# Patient Record
Sex: Female | Born: 1975 | Race: Black or African American | Hispanic: No | Smoking: Never smoker
Health system: Southern US, Community
[De-identification: ages and names within clinical notes are randomized; demographics above are authoritative.]

## PROBLEM LIST (undated history)

## (undated) ENCOUNTER — Emergency Department (HOSPITAL_COMMUNITY): Payer: Self-pay

## (undated) DIAGNOSIS — J3089 Other allergic rhinitis: Secondary | ICD-10-CM

## (undated) DIAGNOSIS — N201 Calculus of ureter: Secondary | ICD-10-CM

## (undated) DIAGNOSIS — F909 Attention-deficit hyperactivity disorder, unspecified type: Secondary | ICD-10-CM

## (undated) DIAGNOSIS — E210 Primary hyperparathyroidism: Secondary | ICD-10-CM

## (undated) DIAGNOSIS — Z87442 Personal history of urinary calculi: Secondary | ICD-10-CM

## (undated) DIAGNOSIS — Z923 Personal history of irradiation: Secondary | ICD-10-CM

## (undated) DIAGNOSIS — R519 Headache, unspecified: Secondary | ICD-10-CM

## (undated) DIAGNOSIS — M654 Radial styloid tenosynovitis [de Quervain]: Secondary | ICD-10-CM

## (undated) DIAGNOSIS — Z8719 Personal history of other diseases of the digestive system: Secondary | ICD-10-CM

## (undated) DIAGNOSIS — Z972 Presence of dental prosthetic device (complete) (partial): Secondary | ICD-10-CM

## (undated) DIAGNOSIS — Z973 Presence of spectacles and contact lenses: Secondary | ICD-10-CM

## (undated) DIAGNOSIS — K219 Gastro-esophageal reflux disease without esophagitis: Secondary | ICD-10-CM

## (undated) DIAGNOSIS — R3915 Urgency of urination: Secondary | ICD-10-CM

## (undated) DIAGNOSIS — D649 Anemia, unspecified: Secondary | ICD-10-CM

## (undated) DIAGNOSIS — R351 Nocturia: Secondary | ICD-10-CM

## (undated) DIAGNOSIS — M67432 Ganglion, left wrist: Secondary | ICD-10-CM

## (undated) DIAGNOSIS — K5900 Constipation, unspecified: Secondary | ICD-10-CM

## (undated) DIAGNOSIS — F411 Generalized anxiety disorder: Secondary | ICD-10-CM

## (undated) DIAGNOSIS — R350 Frequency of micturition: Secondary | ICD-10-CM

## (undated) DIAGNOSIS — Z6830 Body mass index (BMI) 30.0-30.9, adult: Secondary | ICD-10-CM

## (undated) DIAGNOSIS — R300 Dysuria: Secondary | ICD-10-CM

## (undated) DIAGNOSIS — M1990 Unspecified osteoarthritis, unspecified site: Secondary | ICD-10-CM

## (undated) DIAGNOSIS — A419 Sepsis, unspecified organism: Secondary | ICD-10-CM

## (undated) DIAGNOSIS — E079 Disorder of thyroid, unspecified: Secondary | ICD-10-CM

## (undated) DIAGNOSIS — J45909 Unspecified asthma, uncomplicated: Secondary | ICD-10-CM

## (undated) DIAGNOSIS — R319 Hematuria, unspecified: Secondary | ICD-10-CM

## (undated) HISTORY — DX: Anemia, unspecified: D64.9

## (undated) HISTORY — DX: Disorder of thyroid, unspecified: E07.9

## (undated) HISTORY — DX: Gastro-esophageal reflux disease without esophagitis: K21.9

## (undated) HISTORY — DX: Body mass index (BMI) 30.0-30.9, adult: Z68.30

---

## 1997-09-15 ENCOUNTER — Other Ambulatory Visit: Admission: RE | Admit: 1997-09-15 | Discharge: 1997-09-15 | Payer: Self-pay | Admitting: Obstetrics and Gynecology

## 1997-09-29 ENCOUNTER — Observation Stay (HOSPITAL_COMMUNITY): Admission: AD | Admit: 1997-09-29 | Discharge: 1997-09-29 | Payer: Self-pay | Admitting: Obstetrics and Gynecology

## 1997-10-03 ENCOUNTER — Inpatient Hospital Stay (HOSPITAL_COMMUNITY): Admission: AD | Admit: 1997-10-03 | Discharge: 1997-10-03 | Payer: Self-pay | Admitting: Obstetrics and Gynecology

## 1998-01-02 ENCOUNTER — Inpatient Hospital Stay (HOSPITAL_COMMUNITY): Admission: AD | Admit: 1998-01-02 | Discharge: 1998-01-02 | Payer: Self-pay | Admitting: *Deleted

## 1998-01-07 ENCOUNTER — Inpatient Hospital Stay (HOSPITAL_COMMUNITY): Admission: AD | Admit: 1998-01-07 | Discharge: 1998-01-07 | Payer: Self-pay | Admitting: Obstetrics and Gynecology

## 1998-01-08 ENCOUNTER — Inpatient Hospital Stay (HOSPITAL_COMMUNITY): Admission: AD | Admit: 1998-01-08 | Discharge: 1998-01-08 | Payer: Self-pay | Admitting: Obstetrics and Gynecology

## 1998-01-20 ENCOUNTER — Ambulatory Visit (HOSPITAL_COMMUNITY): Admission: RE | Admit: 1998-01-20 | Discharge: 1998-01-20 | Payer: Self-pay | Admitting: Obstetrics and Gynecology

## 1998-02-22 ENCOUNTER — Inpatient Hospital Stay (HOSPITAL_COMMUNITY): Admission: AD | Admit: 1998-02-22 | Discharge: 1998-02-22 | Payer: Self-pay | Admitting: Obstetrics and Gynecology

## 1998-03-05 ENCOUNTER — Inpatient Hospital Stay (HOSPITAL_COMMUNITY): Admission: AD | Admit: 1998-03-05 | Discharge: 1998-03-05 | Payer: Self-pay | Admitting: Obstetrics and Gynecology

## 1998-03-17 ENCOUNTER — Inpatient Hospital Stay (HOSPITAL_COMMUNITY): Admission: AD | Admit: 1998-03-17 | Discharge: 1998-03-20 | Payer: Self-pay | Admitting: Obstetrics and Gynecology

## 1998-09-20 ENCOUNTER — Other Ambulatory Visit: Admission: RE | Admit: 1998-09-20 | Discharge: 1998-09-20 | Payer: Self-pay | Admitting: Obstetrics and Gynecology

## 1998-12-26 ENCOUNTER — Emergency Department (HOSPITAL_COMMUNITY): Admission: EM | Admit: 1998-12-26 | Discharge: 1998-12-26 | Payer: Self-pay | Admitting: Emergency Medicine

## 1999-06-20 ENCOUNTER — Inpatient Hospital Stay (HOSPITAL_COMMUNITY): Admission: AD | Admit: 1999-06-20 | Discharge: 1999-06-20 | Payer: Self-pay | Admitting: Obstetrics & Gynecology

## 1999-07-27 ENCOUNTER — Inpatient Hospital Stay (HOSPITAL_COMMUNITY): Admission: AD | Admit: 1999-07-27 | Discharge: 1999-07-27 | Payer: Self-pay | Admitting: Obstetrics

## 1999-10-06 ENCOUNTER — Inpatient Hospital Stay (HOSPITAL_COMMUNITY): Admission: AD | Admit: 1999-10-06 | Discharge: 1999-10-06 | Payer: Self-pay | Admitting: Obstetrics & Gynecology

## 2000-02-24 ENCOUNTER — Inpatient Hospital Stay (HOSPITAL_COMMUNITY): Admission: AD | Admit: 2000-02-24 | Discharge: 2000-02-24 | Payer: Self-pay | Admitting: Obstetrics & Gynecology

## 2000-09-01 ENCOUNTER — Emergency Department (HOSPITAL_COMMUNITY): Admission: EM | Admit: 2000-09-01 | Discharge: 2000-09-01 | Payer: Self-pay | Admitting: Emergency Medicine

## 2000-12-18 ENCOUNTER — Other Ambulatory Visit: Admission: RE | Admit: 2000-12-18 | Discharge: 2000-12-18 | Payer: Self-pay | Admitting: Internal Medicine

## 2000-12-21 ENCOUNTER — Encounter: Payer: Self-pay | Admitting: Internal Medicine

## 2000-12-21 ENCOUNTER — Encounter: Admission: RE | Admit: 2000-12-21 | Discharge: 2000-12-21 | Payer: Self-pay | Admitting: Internal Medicine

## 2001-01-14 ENCOUNTER — Encounter: Payer: Self-pay | Admitting: Internal Medicine

## 2001-01-14 ENCOUNTER — Encounter: Admission: RE | Admit: 2001-01-14 | Discharge: 2001-01-14 | Payer: Self-pay | Admitting: Internal Medicine

## 2001-11-03 ENCOUNTER — Inpatient Hospital Stay (HOSPITAL_COMMUNITY): Admission: AD | Admit: 2001-11-03 | Discharge: 2001-11-03 | Payer: Self-pay | Admitting: Obstetrics and Gynecology

## 2001-11-03 ENCOUNTER — Encounter: Payer: Self-pay | Admitting: Obstetrics and Gynecology

## 2001-11-12 ENCOUNTER — Observation Stay (HOSPITAL_COMMUNITY): Admission: AD | Admit: 2001-11-12 | Discharge: 2001-11-12 | Payer: Self-pay | Admitting: Obstetrics and Gynecology

## 2001-11-21 ENCOUNTER — Inpatient Hospital Stay (HOSPITAL_COMMUNITY): Admission: AD | Admit: 2001-11-21 | Discharge: 2001-11-21 | Payer: Self-pay | Admitting: Obstetrics and Gynecology

## 2001-11-25 ENCOUNTER — Other Ambulatory Visit: Admission: RE | Admit: 2001-11-25 | Discharge: 2001-11-25 | Payer: Self-pay | Admitting: Obstetrics and Gynecology

## 2001-12-06 ENCOUNTER — Inpatient Hospital Stay (HOSPITAL_COMMUNITY): Admission: AD | Admit: 2001-12-06 | Discharge: 2001-12-06 | Payer: Self-pay | Admitting: Obstetrics and Gynecology

## 2002-01-16 ENCOUNTER — Ambulatory Visit (HOSPITAL_COMMUNITY): Admission: RE | Admit: 2002-01-16 | Discharge: 2002-01-16 | Payer: Self-pay | Admitting: Obstetrics and Gynecology

## 2002-01-16 ENCOUNTER — Encounter: Payer: Self-pay | Admitting: Obstetrics and Gynecology

## 2002-05-01 ENCOUNTER — Inpatient Hospital Stay (HOSPITAL_COMMUNITY): Admission: AD | Admit: 2002-05-01 | Discharge: 2002-05-01 | Payer: Self-pay | Admitting: Obstetrics and Gynecology

## 2002-06-02 ENCOUNTER — Inpatient Hospital Stay (HOSPITAL_COMMUNITY): Admission: AD | Admit: 2002-06-02 | Discharge: 2002-06-04 | Payer: Self-pay | Admitting: Obstetrics and Gynecology

## 2003-08-31 ENCOUNTER — Emergency Department (HOSPITAL_COMMUNITY): Admission: EM | Admit: 2003-08-31 | Discharge: 2003-09-01 | Payer: Self-pay | Admitting: Emergency Medicine

## 2003-11-18 ENCOUNTER — Inpatient Hospital Stay (HOSPITAL_COMMUNITY): Admission: AD | Admit: 2003-11-18 | Discharge: 2003-11-19 | Payer: Self-pay | Admitting: Obstetrics and Gynecology

## 2003-12-04 ENCOUNTER — Inpatient Hospital Stay (HOSPITAL_COMMUNITY): Admission: AD | Admit: 2003-12-04 | Discharge: 2003-12-05 | Payer: Self-pay | Admitting: Obstetrics and Gynecology

## 2003-12-11 ENCOUNTER — Inpatient Hospital Stay (HOSPITAL_COMMUNITY): Admission: AD | Admit: 2003-12-11 | Discharge: 2003-12-12 | Payer: Self-pay | Admitting: Obstetrics and Gynecology

## 2003-12-11 ENCOUNTER — Other Ambulatory Visit: Admission: RE | Admit: 2003-12-11 | Discharge: 2003-12-11 | Payer: Self-pay | Admitting: Obstetrics and Gynecology

## 2003-12-11 ENCOUNTER — Inpatient Hospital Stay (HOSPITAL_COMMUNITY): Admission: AD | Admit: 2003-12-11 | Discharge: 2003-12-11 | Payer: Self-pay | Admitting: Obstetrics and Gynecology

## 2003-12-13 ENCOUNTER — Inpatient Hospital Stay (HOSPITAL_COMMUNITY): Admission: AD | Admit: 2003-12-13 | Discharge: 2003-12-13 | Payer: Self-pay | Admitting: Obstetrics and Gynecology

## 2003-12-16 ENCOUNTER — Inpatient Hospital Stay (HOSPITAL_COMMUNITY): Admission: AD | Admit: 2003-12-16 | Discharge: 2003-12-16 | Payer: Self-pay | Admitting: Internal Medicine

## 2003-12-17 ENCOUNTER — Inpatient Hospital Stay (HOSPITAL_COMMUNITY): Admission: AD | Admit: 2003-12-17 | Discharge: 2003-12-17 | Payer: Self-pay | Admitting: Obstetrics and Gynecology

## 2003-12-18 ENCOUNTER — Inpatient Hospital Stay (HOSPITAL_COMMUNITY): Admission: AD | Admit: 2003-12-18 | Discharge: 2003-12-18 | Payer: Self-pay | Admitting: Obstetrics and Gynecology

## 2003-12-20 ENCOUNTER — Encounter (HOSPITAL_COMMUNITY): Admission: AD | Admit: 2003-12-20 | Discharge: 2004-01-19 | Payer: Self-pay | Admitting: Obstetrics and Gynecology

## 2004-05-25 ENCOUNTER — Inpatient Hospital Stay (HOSPITAL_COMMUNITY): Admission: AD | Admit: 2004-05-25 | Discharge: 2004-05-25 | Payer: Self-pay | Admitting: Obstetrics

## 2004-06-04 ENCOUNTER — Inpatient Hospital Stay (HOSPITAL_COMMUNITY): Admission: AD | Admit: 2004-06-04 | Discharge: 2004-06-07 | Payer: Self-pay | Admitting: Obstetrics and Gynecology

## 2004-06-19 HISTORY — PX: OTHER SURGICAL HISTORY: SHX169

## 2004-06-27 ENCOUNTER — Emergency Department (HOSPITAL_COMMUNITY): Admission: EM | Admit: 2004-06-27 | Discharge: 2004-06-27 | Payer: Self-pay | Admitting: Emergency Medicine

## 2004-06-27 ENCOUNTER — Observation Stay (HOSPITAL_COMMUNITY): Admission: AD | Admit: 2004-06-27 | Discharge: 2004-06-28 | Payer: Self-pay | Admitting: Urology

## 2005-08-27 ENCOUNTER — Emergency Department (HOSPITAL_COMMUNITY): Admission: EM | Admit: 2005-08-27 | Discharge: 2005-08-28 | Payer: Self-pay | Admitting: Emergency Medicine

## 2005-09-13 ENCOUNTER — Inpatient Hospital Stay (HOSPITAL_COMMUNITY): Admission: AD | Admit: 2005-09-13 | Discharge: 2005-09-13 | Payer: Self-pay | Admitting: Obstetrics and Gynecology

## 2005-10-04 ENCOUNTER — Inpatient Hospital Stay (HOSPITAL_COMMUNITY): Admission: AD | Admit: 2005-10-04 | Discharge: 2005-10-04 | Payer: Self-pay | Admitting: Obstetrics and Gynecology

## 2006-02-08 ENCOUNTER — Inpatient Hospital Stay (HOSPITAL_COMMUNITY): Admission: AD | Admit: 2006-02-08 | Discharge: 2006-02-08 | Payer: Self-pay | Admitting: Internal Medicine

## 2006-02-18 ENCOUNTER — Inpatient Hospital Stay (HOSPITAL_COMMUNITY): Admission: AD | Admit: 2006-02-18 | Discharge: 2006-02-18 | Payer: Self-pay | Admitting: Obstetrics and Gynecology

## 2006-02-25 ENCOUNTER — Inpatient Hospital Stay (HOSPITAL_COMMUNITY): Admission: AD | Admit: 2006-02-25 | Discharge: 2006-02-25 | Payer: Self-pay | Admitting: Obstetrics and Gynecology

## 2006-03-06 ENCOUNTER — Inpatient Hospital Stay (HOSPITAL_COMMUNITY): Admission: AD | Admit: 2006-03-06 | Discharge: 2006-03-06 | Payer: Self-pay | Admitting: Obstetrics and Gynecology

## 2006-03-13 ENCOUNTER — Inpatient Hospital Stay (HOSPITAL_COMMUNITY): Admission: AD | Admit: 2006-03-13 | Discharge: 2006-03-16 | Payer: Self-pay | Admitting: Obstetrics and Gynecology

## 2006-04-04 ENCOUNTER — Emergency Department (HOSPITAL_COMMUNITY): Admission: EM | Admit: 2006-04-04 | Discharge: 2006-04-04 | Payer: Self-pay | Admitting: Emergency Medicine

## 2006-04-06 ENCOUNTER — Ambulatory Visit: Payer: Self-pay | Admitting: Psychology

## 2006-10-30 ENCOUNTER — Ambulatory Visit: Payer: Self-pay | Admitting: Family Medicine

## 2006-10-30 ENCOUNTER — Encounter: Payer: Self-pay | Admitting: Family Medicine

## 2006-10-30 ENCOUNTER — Encounter (INDEPENDENT_AMBULATORY_CARE_PROVIDER_SITE_OTHER): Payer: Self-pay | Admitting: Family Medicine

## 2006-10-30 DIAGNOSIS — Z87442 Personal history of urinary calculi: Secondary | ICD-10-CM | POA: Insufficient documentation

## 2006-10-30 DIAGNOSIS — J309 Allergic rhinitis, unspecified: Secondary | ICD-10-CM | POA: Insufficient documentation

## 2007-04-09 ENCOUNTER — Telehealth (INDEPENDENT_AMBULATORY_CARE_PROVIDER_SITE_OTHER): Payer: Self-pay | Admitting: *Deleted

## 2007-04-10 ENCOUNTER — Ambulatory Visit: Payer: Self-pay | Admitting: Family Medicine

## 2007-04-10 DIAGNOSIS — N390 Urinary tract infection, site not specified: Secondary | ICD-10-CM | POA: Insufficient documentation

## 2007-04-10 LAB — CONVERTED CEMR LAB
Bilirubin Urine: NEGATIVE
Blood in Urine, dipstick: NEGATIVE
Glucose, Urine, Semiquant: NEGATIVE
Ketones, urine, test strip: NEGATIVE
Nitrite: NEGATIVE
Specific Gravity, Urine: <1.005
Urobilinogen, UA: NEGATIVE
pH: 8

## 2007-04-11 ENCOUNTER — Encounter (INDEPENDENT_AMBULATORY_CARE_PROVIDER_SITE_OTHER): Payer: Self-pay | Admitting: Family Medicine

## 2007-04-17 ENCOUNTER — Ambulatory Visit: Payer: Self-pay | Admitting: Family Medicine

## 2007-04-26 ENCOUNTER — Telehealth (INDEPENDENT_AMBULATORY_CARE_PROVIDER_SITE_OTHER): Payer: Self-pay | Admitting: Family Medicine

## 2007-04-27 ENCOUNTER — Encounter (INDEPENDENT_AMBULATORY_CARE_PROVIDER_SITE_OTHER): Payer: Self-pay | Admitting: Family Medicine

## 2007-04-29 ENCOUNTER — Encounter (INDEPENDENT_AMBULATORY_CARE_PROVIDER_SITE_OTHER): Payer: Self-pay | Admitting: Family Medicine

## 2007-05-03 ENCOUNTER — Telehealth (INDEPENDENT_AMBULATORY_CARE_PROVIDER_SITE_OTHER): Payer: Self-pay | Admitting: *Deleted

## 2007-05-06 ENCOUNTER — Encounter (INDEPENDENT_AMBULATORY_CARE_PROVIDER_SITE_OTHER): Payer: Self-pay | Admitting: Family Medicine

## 2007-07-22 ENCOUNTER — Ambulatory Visit: Payer: Self-pay | Admitting: Family Medicine

## 2008-10-11 ENCOUNTER — Emergency Department (HOSPITAL_COMMUNITY): Admission: EM | Admit: 2008-10-11 | Discharge: 2008-10-11 | Payer: Self-pay | Admitting: Emergency Medicine

## 2008-10-13 ENCOUNTER — Inpatient Hospital Stay (HOSPITAL_COMMUNITY): Admission: EM | Admit: 2008-10-13 | Discharge: 2008-10-16 | Payer: Self-pay | Admitting: Emergency Medicine

## 2008-10-15 ENCOUNTER — Ambulatory Visit: Payer: Self-pay | Admitting: Dentistry

## 2009-07-29 ENCOUNTER — Ambulatory Visit (HOSPITAL_BASED_OUTPATIENT_CLINIC_OR_DEPARTMENT_OTHER): Admission: RE | Admit: 2009-07-29 | Discharge: 2009-07-29 | Payer: Self-pay | Admitting: Urology

## 2009-07-29 HISTORY — PX: OTHER SURGICAL HISTORY: SHX169

## 2009-08-02 ENCOUNTER — Ambulatory Visit (HOSPITAL_COMMUNITY): Admission: RE | Admit: 2009-08-02 | Discharge: 2009-08-02 | Payer: Self-pay | Admitting: Urology

## 2009-08-02 HISTORY — PX: EXTRACORPOREAL SHOCK WAVE LITHOTRIPSY: SHX1557

## 2009-08-28 ENCOUNTER — Emergency Department (HOSPITAL_COMMUNITY): Admission: EM | Admit: 2009-08-28 | Discharge: 2009-08-28 | Payer: Self-pay | Admitting: Emergency Medicine

## 2010-04-15 ENCOUNTER — Ambulatory Visit (HOSPITAL_BASED_OUTPATIENT_CLINIC_OR_DEPARTMENT_OTHER): Admission: RE | Admit: 2010-04-15 | Discharge: 2010-04-15 | Payer: Self-pay | Admitting: Urology

## 2010-04-15 HISTORY — PX: OTHER SURGICAL HISTORY: SHX169

## 2010-07-21 NOTE — Assessment & Plan Note (Signed)
Summary: sore throat,cbs   Vital Signs:  Patient Profile:   35 Years Old Female Height:     68.5 inches Weight:      181.38 pounds Temp:     98.1 degrees F oral Pulse rate:   72 / minute Resp:     14 per minute BP sitting:   100 / 72  (right arm)  Pt. in pain?   no  Vitals Entered By: Ardyth Man (July 22, 2007 10:25 AM)                  Chief Complaint:  sinus infection x3 months.  History of Present Illness: Reports she has been dealing with allergies for off and on for 2 months. Patient reports she was taking Singulair and nasal spray in May of  2008 and stopped after symptoms resolved.  Currently has no refills on these medications which were helpful. Denies fever, chill, coughing, nausea or vomiting.  Current Allergies: ! ROCEPHIN  Past Medical History:    Reviewed history from 10/30/2006 and no changes required:       Allergic rhinitis       Nephrolithiasis, hx of   Social History:    Reviewed history from 10/30/2006 and no changes required:       Occupation: CNA       Married and  5 children       Never Smoked       Alcohol use-yes: occasionally 1-2 drinks per year       Drug use-no     Physical Exam  General:     Well-developed,well-nourished,in no acute distress; alert,appropriate and cooperative throughout examination Ears:     External ear exam shows no significant lesions or deformities.  Otoscopic examination reveals clear canals, tympanic membranes are intact bilaterally without bulging, retraction, inflammation or discharge. Hearing is grossly normal bilaterally. Nose:     Cobblestone appearance with clear nasal discharge. Mouth:     Oral mucosa and oropharynx without lesions or exudates.  Teeth in good repair. Neck:     No deformities, masses, or tenderness noted. Lungs:     Normal respiratory effort, chest expands symmetrically. Lungs are clear to auscultation, no crackles or wheezes. Heart:     Normal rate and regular rhythm. S1  and S2 normal without gallop, murmur, click, rub or other extra sounds.    Impression & Recommendations:  Problem # 1:  ALLERGIC RHINITIS (ICD-477.9)  Her updated medication list for this problem includes:    Flonase 50 Mcg/act Susp (Fluticasone propionate) .Marland Kitchen... 2 sprays each day  singulair restarted.  F/u by phone if no improvement in 1 week. We would call in antibiotic for a sinusitis F/u by OV if symptom worsen in any way.  Complete Medication List: 1)  Strattera 40 Mg Caps (Atomoxetine hcl) .... 07/29/2007 patient states is not taking but has rx 2)  Flonase 50 Mcg/act Susp (Fluticasone propionate) .... 2 sprays each day 3)  Singulair 10 Mg Tabs (Montelukast sodium) .Marland Kitchen.. 1 by mouth qd     Prescriptions: SINGULAIR 10 MG TABS (MONTELUKAST SODIUM) 1 by mouth qd  #30 x 5   Entered and Authorized by:   Leanne Chang MD   Signed by:   Leanne Chang MD on 07/22/2007   Method used:   Electronically sent to ...       [facility] [address] [id number]       [address]       [place], [place]  [address]       Ph: [phone]       Fax: [phone]   RxID:   [id number] FLONASE 50 MCG/ACT SUSP (FLUTICASONE PROPIONATE) 2 sprays each day  #1 x 6   Entered and Authorized by:   Leanne Chang MD   Signed by:   Leanne Chang MD on 07/22/2007   Method used:   Electronically sent to ...       [facility] [address] [id number]*       [address]       [place], [place]  [address]       Ph: [phone]       Fax: [phone]   RxID:   [id number]  ]

## 2010-07-21 NOTE — Medication Information (Signed)
Summary: [facility]  [facility]   Imported By: Freddy Jaksch 04/30/2007 09:03:27  _____________________________________________________________________  External Attachment:    Type:   Image     Comment:   External Document

## 2010-07-21 NOTE — Progress Notes (Signed)
Summary: PRIOR AUTH APPROVED FOR STRATTERA PER [employer]  Phone Note Refill Request   CORRECTION APPEAL SENT TO [employer] RE; Wilhemena Durie  Initial call taken by: Kandice Hams,  May 03, 2007 4:15 PM  Follow-up for Phone Call        pt called wanteing to know status of rx informed it is appeal process, we left samples of stratterra 2 wks worth per dr Blossom Hoops pt takes two 40 mg tab p[er day Follow-up by: Kandice Hams,  May 06, 2007 5:54 PM  Additional Follow-up for Phone Call Additional follow up Details #1::        PRIOR AUTH APPROVED FOR STRATTERA 04/15/07 TO 05/05/08 PER [facility] CASE DG#[id number]  [facility] FAXED...................................................................Marland KitchenKandice Hams  May 07, 2007 8:48 AM LEFT MSG AT PT HOME AND CELL # PRIOR [facility] APPROVED CAN PICK UP MED AT PHARMACY...................................................................Marland KitchenKandice Hams  May 07, 2007 9:05 AM   Additional Follow-up by: Kandice Hams,  May 07, 2007 8:48 AM

## 2010-07-21 NOTE — Progress Notes (Signed)
Summary: PT NEEDS TO SPEAK WITH A NURSE NUMBNESS ON HER ARM AND LEG   Phone Note Call from Patient Call back at [phone]   Caller: Patient Reason for Call: Acute Illness, Talk to Nurse Summary of Call: DR ALEJANDRO// PT IS HAVING NUMBNESS ON HER ARMS AND LEGS AND ON HER LEFT AND RIGHT SIDE.PT WANTS TO KNOW WHAT CAN SHE DO OR SHOULD SHE SCHEDULE A APP. Initial call taken by: Job Founds,  April 09, 2007 9:48 AM  Follow-up for Phone Call        SPOKE WITH PT SAID NOT A ONGOING THING SAID WHEN I GO TO SLEEP AT NIGHT OR SOMETIME DURING DAY LEGS BOTH GO TO SLEEP, RIGHT LEG BIGGER THAN OHER ONE BOTH SWELL AT NIGHT, DO A LOT OF [place] AT HOSPITAL,PT PROCEEDED TO SAY I HAVE ANOTHER ISSUE I HAVE A HX OF KIDNEY STONES AND HAVING SOME PAIN OV SCHED FOR TOMORROW, WAS GOING TO SCHED OV FOR 30 MIN FOR HER MULTIPLE ISSUES, PT COULD NOT COME IN IN AFTERNOON WANTED AM APPT PT WANTS TO ADDRESS KIDNEY ISSUES 1ST Follow-up by: Kandice Hams,  April 09, 2007 10:04 AM

## 2010-07-21 NOTE — Assessment & Plan Note (Signed)
   Vital Signs:  Patient Profile:   35 Years Old Female Height:     68.5 inches Weight:      175.13 pounds Temp:     98.2 degrees F oral Pulse rate:   72 / minute Resp:     18 per minute BP sitting:   110 / 70  (right arm)  Pt. in pain?   no  Vitals Entered By: Arnetha Gula (Oct 30, 2006 9:44 AM)                Chief Complaint:  Pt. in office for headaches due to allergies..  History of Present Illness:       This is a 35 years old female who presents with rhinitis.  The patient complains of runny nose, nasal congestion, sneezing, itchy eyes, watery eyes, itchy nose, sore throat, sinus pain, and sinus pressure.  Patient states symptoms are worse with exposure to outdoors, change in weather, odors, and perfumes.  Symptoms improve with decongestants.  Ineffective treatment includes loratadine, fexofenadine, and cetirizine.  Prior evaluation to date skin testing.      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 No apparent alopecia or balding. Ears:     External ear exam shows no significant lesions or deformities.  Otoscopic examination reveals clear canals, tympanic membranes are intact bilaterally without bulging, retraction, inflammation or discharge. Hearing is grossly normal bilaterally. Nose:     Very swollen turbinate, with cobblestone appearance. Clear nasal discharge Mouth:     Oral mucosa and oropharynx without lesions or exudates.  Teeth in good repair. Neck:     No deformities, masses, or  tenderness noted. Lungs:     Normal respiratory effort, chest expands symmetrically. Lungs are clear to auscultation, no crackles or wheezes. Heart:     Normal rate and regular rhythm. S1 and S2 normal without gallop, murmur, click, rub or other extra sounds.    Impression & Recommendations:  Problem # 1:  ALLERGIC RHINITIS (ICD-477.9) 1. Singulair 10 mg daily 2. Xyzal 1 daily 3.Nasocort 2 squirts each nostril daily. 4. Instructions reviewed and medication info provided.   Patient Instructions: 1)  Please schedule a follow-up appointment in 1 month.

## 2010-07-21 NOTE — Progress Notes (Signed)
Summary: prior auth for Blase Mess DENIED/ DR A PLEASE SEE  Phone Note Refill Request   prior auth in process for strattera -[facility]  Initial call taken by: Kandice Hams,  April 26, 2007 5:13 PM  Follow-up for Phone Call        dr Blossom Hoops this med was denied per [facility], coverage is provided in situations where the dx of ADD/ADHD has been confirmed using established criteria such as Diagnostic and Statistical Manual of Mental Disorders criteria or other criteria deemed appropriate by the prescriber. This information is being given to [facility] to be scanned in pt chart.  Would would you like to rx in its place? Follow-up by: Kandice Hams,  April 30, 2007 8:52 AM  Additional Follow-up for Phone Call Additional follow up Details #1::        Appropriate criteria was used. Form was fill out wrong. Additional Follow-up by: Leanne Chang MD,  May 03, 2007 3:48 PM

## 2010-07-21 NOTE — Medication Information (Signed)
Summary: [facility] prior auth  [facility] prior auth   Imported By: Freddy Jaksch 05/03/2007 16:59:47  _____________________________________________________________________  External Attachment:    Type:   Image     Comment:   External Document

## 2010-07-21 NOTE — Medication Information (Signed)
Summary: [facility]  [facility]   Imported By: Freddy Jaksch 05/08/2007 12:09:06  _____________________________________________________________________  External Attachment:    Type:   Image     Comment:   External Document

## 2010-07-21 NOTE — Assessment & Plan Note (Signed)
Summary: PAIN, HX OF KIDNEY STONES/ALR   Vital Signs:  Patient Profile:   35 Years Old Female Height:     68.5 inches Temp:     98.5 degrees F oral BP sitting:   110 / 74  (left arm)  Vitals Entered By: Doristine Devoid (April 10, 2007 10:26 AM)                 Chief Complaint:  BURNING AFTER URINATING DENIES BACK PAIN ALSO LEGS AND ARM  and Dysuria.  History of Present Illness:  Dysuria      This is a 35 year old woman who presents with Dysuria.  Started Monday with dysuria, followed by Arthralgia of body (legs and arms). Feels tired, but denies fever. Slight chills on Monday. Similar episode was a Kidney infection. Patient also has history of kidney stones and has a urologist.  The patient reports burning with urination and hematuria, but denies urinary frequency, urgency, and vaginal discharge.  Associated symptoms include arthralgias.  The patient denies the following associated symptoms: fever.  Risk factors for urinary tract infection include history of pyelonephritis.  The patient denies the following risk factors: pregnancy and history of STD.  History is significant for kidney stones.          Physical Exam  General:     Well-developed,well-nourished,in no acute distress; alert,appropriate and cooperative throughout examination. Pleasant Mouth:     Oral mucosa and oropharynx without lesions or exudates.  Teeth in good repair. Lungs:     Normal respiratory effort, chest expands symmetrically. Lungs are clear to auscultation, no crackles or wheezes. Heart:     Normal rate and regular rhythm. S1 and S2 normal without gallop, murmur, click, rub or other extra sounds. Abdomen:     mild suprapubic pressure.no masses, no guarding, no rigidity, no rebound tenderness, no abdominal hernia, and no inguinal hernia.   No CVA tenderness    Impression & Recommendations:  Problem # 1:  UTI (ICD-599.0)  Her updated medication list for this problem includes:    Cipro 250 Mg  Tabs (Ciprofloxacin hcl) .Marland Kitchen... 1 by mouth bid    Pyridium 100 Mg Tabs (Phenazopyridine hcl) .Marland Kitchen... 1 by mouth tid  Orders: T-Culture, Urine ([id number]) UA Dipstick w/o Micro (81002)  Encouraged to push clear liquids, get enough rest, and take acetaminophen as needed. To be seen in 10 days if no improvement, sooner if worse. i.e fever , worsening symptoms, pain, trouble urinary, etc  F/u in 1 week   Complete Medication List: 1)  Cipro 250 Mg Tabs (Ciprofloxacin hcl) .Marland Kitchen.. 1 by mouth bid 2)  Pyridium 100 Mg Tabs (Phenazopyridine hcl) .Marland Kitchen.. 1 by mouth tid     Prescriptions: PYRIDIUM 100 MG  TABS (PHENAZOPYRIDINE HCL) 1 by mouth tid  #6 x 0   Entered and Authorized by:   Leanne Chang MD   Signed by:   Leanne Chang MD on 04/10/2007   Method used:   Electronically sent to ...       [facility]       [address]       [place], [place]  [address]       Ph: [phone]       Fax: [phone]   RxID:   [id number] CIPRO 250 MG TABS (CIPROFLOXACIN HCL) 1 by mouth bid  #14 x 0   Entered and Authorized by:   Leanne Chang MD   Signed by:   Leanne Chang MD on  04/10/2007   Method used:   Electronically sent to ...       [facility]       [address]       [place], [place]  [address]       Ph: [phone]       Fax: [phone]   RxID:   [id number]  ] Laboratory Results   Urine Tests    Routine Urinalysis   Glucose: negative   (Normal Range: Negative) Bilirubin: negative   (Normal Range: Negative) Ketone: negative   (Normal Range: Negative) Spec. Gravity: <1.005   (Normal Range: 1.003-1.035) Blood: negative   (Normal Range: Negative) pH: 8.0   (Normal Range: 5.0-8.0) Protein: trace   (Normal Range: Negative) Urobilinogen: negative   (Normal Range: 0-1) Nitrite: negative   (Normal Range: Negative) Leukocyte Esterace: small   (Normal Range: Negative)

## 2010-07-21 NOTE — Assessment & Plan Note (Signed)
Summary: 1 WEEK RECHECK,CBS   Vital Signs:  Patient Profile:   35 Years Old Female Height:     68.5 inches Weight:      182.38 pounds Temp:     98.1 degrees F oral Pulse rate:   74 / minute Resp:     18 per minute BP sitting:   100 / 78  (right arm)  Pt. in pain?   no  Vitals Entered By: Ardyth Man (April 17, 2007 4:05 PM)                  Chief Complaint:  one week follow up pt. states:"Doing fine"..  History of Present Illness: Patient reports she feels back to baseline. All her symptoms resolved. She still has several days left of antibiotic therapy. No side effects. Leg discomfort also went away.  Patient report she having trouble focusing at school. Reports she has always had trouble concentrating, but has become more promiment since starting school. Patient reveals could never sit still and always has to move around. Daughter diagnosed with ADHD several years ago.        Physical Exam  General:     Well-developed,well-nourished,in no acute distress; alert,appropriate and cooperative throughout examination. Noted feet moving constantly Neck:     No deformities, masses, or tenderness noted. Lungs:     Normal respiratory effort, chest expands symmetrically. Lungs are clear to auscultation, no crackles or wheezes. Heart:     Normal rate and regular rhythm. S1 and S2 normal without gallop, murmur, click, rub or other extra sounds. Abdomen:     Bowel sounds positive,abdomen soft and non-tender without masses, organomegaly or hernias noted. No CVA tenderness    Impression & Recommendations:  Problem # 1:  UTI (ICD-599.0) resolved Her updated medication list for this problem includes:    Cipro 250 Mg Tabs (Ciprofloxacin hcl) .Marland Kitchen... 1 by mouth bid    Pyridium 100 Mg Tabs (Phenazopyridine hcl) .Marland Kitchen... 1 by mouth tid   Problem # 2:  ? of ADHD (ICD-314.01) Start Straterra Side effect reviewed Will increase from 40mg  to 80 mg daily in 3 days Samples was  given F/u in 1 month and prn  Complete Medication List: 1)  Cipro 250 Mg Tabs (Ciprofloxacin hcl) .Marland Kitchen.. 1 by mouth bid 2)  Pyridium 100 Mg Tabs (Phenazopyridine hcl) .Marland Kitchen.. 1 by mouth tid     ]  Appended Document: Orders Update    Clinical Lists Changes  Medications: Added new medication of STRATTERA 40 MG  CAPS (ATOMOXETINE HCL) 2 by mouth qd - Signed Rx of STRATTERA 40 MG  CAPS (ATOMOXETINE HCL) 2 by mouth qd;  #60 x 1;  Signed;  Entered by: Leanne Chang MD;  Authorized by: Leanne Chang MD;  Method used: Electronic Observations: Added new observation of NKA: T (04/17/2007 21:45)    Prescriptions: STRATTERA 40 MG  CAPS (ATOMOXETINE HCL) 2 by mouth qd  #60 x 1   Entered and Authorized by:   Leanne Chang MD   Signed by:   Leanne Chang MD on 04/17/2007   Method used:   Electronically sent to ...       [facility] [address] [id number]       [address]       [place], [place]  [address]       Ph: [phone]       Fax: [phone]   RxID:   [id number]

## 2010-08-31 LAB — POCT HEMOGLOBIN-HEMACUE: Hemoglobin: 13.6 g/dL (ref 12.0–15.0)

## 2010-09-07 LAB — PREGNANCY, URINE: Preg Test, Ur: NEGATIVE

## 2010-09-07 LAB — POCT HEMOGLOBIN-HEMACUE: Hemoglobin: 13.9 g/dL (ref 12.0–15.0)

## 2010-09-07 LAB — POCT PREGNANCY, URINE: Preg Test, Ur: NEGATIVE

## 2010-09-28 LAB — POCT I-STAT, CHEM 8
BUN: 6 mg/dL (ref 6–23)
Calcium, Ion: 1.21 mmol/L (ref 1.12–1.32)
Chloride: 104 mEq/L (ref 96–112)
Creatinine, Ser: 0.9 mg/dL (ref 0.4–1.2)
Glucose, Bld: 91 mg/dL (ref 70–99)
HCT: 43 % (ref 36.0–46.0)
Hemoglobin: 14.6 g/dL (ref 12.0–15.0)
Potassium: 4.2 mEq/L (ref 3.5–5.1)
Sodium: 138 mEq/L (ref 135–145)
TCO2: 25 mmol/L (ref 0–100)

## 2010-09-28 LAB — PROTIME-INR
INR: 1 (ref 0.00–1.49)
Prothrombin Time: 13.7 seconds (ref 11.6–15.2)

## 2010-09-28 LAB — CBC
HCT: 39.3 % (ref 36.0–46.0)
HCT: 40.9 % (ref 36.0–46.0)
Hemoglobin: 13.1 g/dL (ref 12.0–15.0)
Hemoglobin: 13.4 g/dL (ref 12.0–15.0)
MCHC: 32.8 g/dL (ref 30.0–36.0)
MCHC: 33.3 g/dL (ref 30.0–36.0)
MCV: 84.9 fL (ref 78.0–100.0)
MCV: 85 fL (ref 78.0–100.0)
Platelets: 247 10*3/uL (ref 150–400)
Platelets: 248 10*3/uL (ref 150–400)
RBC: 4.63 MIL/uL (ref 3.87–5.11)
RBC: 4.82 MIL/uL (ref 3.87–5.11)
RDW: 13.6 % (ref 11.5–15.5)
RDW: 14.2 % (ref 11.5–15.5)
WBC: 6.6 10*3/uL (ref 4.0–10.5)
WBC: 8.9 10*3/uL (ref 4.0–10.5)

## 2010-09-28 LAB — DIFFERENTIAL
Basophils Absolute: 0.1 10*3/uL (ref 0.0–0.1)
Basophils Relative: 1 % (ref 0–1)
Eosinophils Absolute: 0.1 10*3/uL (ref 0.0–0.7)
Eosinophils Relative: 1 % (ref 0–5)
Lymphocytes Relative: 16 % (ref 12–46)
Lymphs Abs: 1.4 10*3/uL (ref 0.7–4.0)
Monocytes Absolute: 0.6 10*3/uL (ref 0.1–1.0)
Monocytes Relative: 6 % (ref 3–12)
Neutro Abs: 6.7 10*3/uL (ref 1.7–7.7)
Neutrophils Relative %: 76 % (ref 43–77)

## 2010-09-28 LAB — BASIC METABOLIC PANEL
BUN: 10 mg/dL (ref 6–23)
CO2: 27 mEq/L (ref 19–32)
Calcium: 9.1 mg/dL (ref 8.4–10.5)
Chloride: 106 mEq/L (ref 96–112)
Creatinine, Ser: 0.84 mg/dL (ref 0.4–1.2)
GFR calc Af Amer: >60 mL/min (ref >60)
GFR calc non Af Amer: >60 mL/min (ref >60)
Glucose, Bld: 109 mg/dL — ABNORMAL HIGH (ref 70–99)
Potassium: 3.9 mEq/L (ref 3.5–5.1)
Sodium: 137 mEq/L (ref 135–145)

## 2010-09-28 LAB — CULTURE, BLOOD (ROUTINE X 2)
Culture: NO GROWTH
Culture: NO GROWTH

## 2010-09-28 LAB — APTT: aPTT: 43 seconds — ABNORMAL HIGH (ref 24–37)

## 2010-11-01 NOTE — Consult Note (Signed)
NAME:  Misty [name] NO.:  [id number]   MEDICAL RECORD NO.:  [id number]          PATIENT TYPE:  INP   LOCATION:  1315                         FACILITY:  [facility]   PHYSICIAN:  Misty [name], D.D.S.DATE OF BIRTH:  Dec 04, 1975   DATE OF CONSULTATION:  10/15/2008  DATE OF DISCHARGE:                                 CONSULTATION   Misty [name] is a 35 year old female referred by Misty [name] for dental consultation.  Patient was admitted with a history of  left facial swelling.  Dental consultation requested to evaluate the  patient and evaluate treatment options as indicated.   HISTORY:  1. Left facial cellulitis.      a.     The patient was admitted and administered IV antibiotic       therapy on October 13, 2008.      b.     Probable dental etiology with dental consultation pending.   ALLERGIES:  LATEX causes contact dermatitis.   MEDICATIONS:  1. Heparin 5000 units subcutaneously every 8 hours.  2. Protonix 40 mg daily.  3. Zosyn 3.375 g IV every 8 hours.   SOCIAL HISTORY:  Patient married with five children.  The patient works  as a Lawyer at [employer].  The patient also going to school full  time at [employer] and is trying to obtain a Master's Degree in social work.  The patient is a nonsmoker, nondrinker.   FAMILY HISTORY:  Mother is alive at age 4 with a history of  hypertension.  Father is deceased with unknown reason for death.   FUNCTIONAL ASSESSMENT:  The patient was independent for ADLs prior to  this admission.   REVIEW OF SYSTEMS:  This is reviewed with the patient including dental  consultation record.   DENTAL HISTORY:   CHIEF COMPLAINT:  Dental consultation requested to evaluate left facial  swelling.   HISTORY OF PRESENT ILLNESS:  Patient with a history of toothache and  swelling which started on October 11, 2008.  The patient went to the  emergency room and was administered penicillin antibiotic therapy 4  times daily  (unknown dose).  The patient subsequently went to a primary  dentist on Monday for dental x-ray and planned for root canal therapy on  Tuesday, Oct 20, 2008.  The patient was instructed to follow up in the ED  again if pain and swelling worsened.  This actually happened on Tuesday,  April m27th, 2010.  The patient eventually did report to the emergency  room for evaluation for admission.  The patient subsequently admitted  and was administered IV antibiotic therapy with dental consultation  requested.   The patient usually sees the dentist on every 39-month basis with Dr.  Theophilus [name] at [facility].  The patient saw an alternate dentist at the  previous appointment October 11, 2008.   DENTAL EXAM:  GENERAL:  Well-developed and well-nourished female in no  acute distress.  VITAL SIGNS:  Blood pressure is 99/65, pulse rate is 64, respirations  are 18, temperature 98.7.  HEENT:  There is no palpable submandibular lymphadenopathy.  There  is  some residual left facial swelling, especially in the periorbital area  of the left side.  DENTITION:  The patient is missing multiple teeth, numbers five, 7, 16,  19, 30, and 31.  PERIODONTAL:  Patient with chronic periodontitis with plaque and  calculus accumulations, selective areas of gingival recession, and no  significant tooth mobility.  DENTAL CARIES:  There are significant dental caries affecting tooth #13.  I would need a full series of dental radiographs to rule out other  incipient dental caries.  ENDODONTIC:  Patient with a history of acute pulpitis symptoms, from  tooth #13.  There also is apical periodontitis and a radiolucency at the  apex of tooth #13.  There is no evidence of buccal abscess.  There is no  evidence of purulence within the mouth.  CROWN OR BRIDGE:  There are no crown or bridge restorations noted.  PROSTHODONTIC:  The patient denies presence of partial dentures.   RADIOGRAPHIC INTERPRETATION:  A panoramic x-ray was  taken on October 15, 2008 in the Department of Dental Medicine.   There are multiple missing teeth.  There is a periapical radiolucency at  the apex tooth #13.  There are extensive dental caries associated with  tooth #13.  There is impacted tooth number 16.  There are multiple  missing teeth.  There is supereruption and drifting of the unopposed  teeth into the edentulous areas.   ASSESSMENT:  1. Acute pulpitis symptoms #13 with periapical pathology.  2. Left facial swelling which is resolving at this time with current      IV antibiotic therapy.  3. Extensive dental caries involving tooth #13.  4. Chronic periodontitis with bone loss.  5. Selective areas of gingival recession.  6. No excessive tooth mobility noted.  7. Poor occlusal scheme secondary to multiple missing teeth.   PLAN/RECOMMENDATIONS:  1. Discussed risks, benefits, complications for the patient in      relationship to her medical and dental conditions.  We discussed      various treatment options to include no treatment, extraction tooth      #13 with alveoloplasty, periodontal therapy, dental restorations,      root canal therapy, crown or bridge therapy, implant therapy,      replacing missing teeth as indicated.  The patient currently wishes      to proceed with root canal therapy, post and core and crown      associated with tooth #13.  The patient will also follow up with a      primary dentist for this treatment as well as future treatment to      evaluate replacement of missing teeth as indicated.  The patient      most likely will remain on IV antibiotic therapy and then be      discharged on oral antibiotic therapy (Augmentin twice daily).      This will be up to the ultimate discretion of Misty [name].  2. Discussion of findings with Dr. Darnelle [name] and coordination of future      dental treatment as indicated.  3. The patient was given a copy of her orthopantogram x-ray to provide      to her primary  dentist to allow for future treatment of tooth #13      with root canal therapy post, core and crown.      Misty [name], D.D.S.  Electronically Signed     RFK/MEDQ  D:  10/15/2008  T:  10/15/2008  Job:  [id number]   cc:   Misty [name], M.D.

## 2010-11-01 NOTE — H&P (Signed)
NAME:  ARIELLA, VOIT NO.:  [id number]   MEDICAL RECORD NO.:  [id number]          PATIENT TYPE:  EMS   LOCATION:  ED                           FACILITY:  [facility]   PHYSICIAN:  Theodosia Paling, MD    DATE OF BIRTH:  January 14, 1976   DATE OF ADMISSION:  10/13/2008  DATE OF DISCHARGE:                              HISTORY & PHYSICAL   PRIMARY CARE PHYSICIAN:  Dr. Gerre Pebbles at [facility] in [facility] route in [place].   CHIEF COMPLAINT:  Facial swelling, tenderness and dental pain.   HISTORY OF PRESENT ILLNESS:  Ms. Madewell is a very pleasant 35 year old  lady without any significant past medical history who was in her usual  state of health until around Saturday.  On Saturday she woke up with a  toothache and some swelling on the mandibular area.  Later on it kept  progressing.  She called her primary care doctor who recommended her to  take penicillin.  She started taking penicillin on Sunday at least 4  times a day.  She is not sure about how much dose was she was taking;  however, she noticed that since Sunday her swelling and tenderness has  progressed significantly so she presented to the dental physician today  who recommended her to come to the emergency room for further evaluation  and management given progression of swelling and tenderness.  Please  note that on Sunday she did call a dental physician.  There was  suspicion of dental abscess at that time and in the emergency room she  underwent a CT scan which did not show any abscess.  She did not have  leukocytosis, fever or chills.  [facility] was  contacted for further evaluation and management of possible worsening  facial cellulitis.   REVIEW OF SYSTEMS:  Essentially negative except what is mentioned in  HPI.  There is no fever or chills, nausea or abdominal pain, cough.   PAST MEDICAL HISTORY:  None.   HOME MEDICATION:  1. Unknown dose of penicillin.  2. Ibuprofen  every 8 hours 800 mg p.r.n.   PAST SURGICAL HISTORY:  None.   ALLERGIES:  No allergies.   SOCIAL HISTORY:  The patient denies tobacco, alcohol or IV drug abuse.  She is married.  She has 5 kids.  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   FAMILY HISTORY:  Her sister has some unknown cardiac condition.  She is  not sure about that but apparently the heart is very weak.   PHYSICAL EXAMINATION:  VITAL SIGNS:  Temperature 98.7, heart rate 79,  respiratory rate 20, oxygen saturations 100% at room air, blood pressure  127/80.  GENERAL:  No acute cardiorespiratory distress.  Appears very tired and  disappointed.  HEENT:  The patient has obvious left-sided facial cellulitis including  periorbital area.  There is evidence of cellulitis, tenderness and  swelling along with warmth.  EOM is intact.  The patient does not have  any evidence of ophthalmoplegia and has extraocular movements intact  without any difficulty or pain.  LUNGS:  Normal breath sounds.  No rhonchi or crepitations.  CARDIOVASCULAR:  S1 and S2 normal.  No gallop or murmur present.  GI:  Soft, nontender.  No organomegaly.  Normal bowel sounds.  EXTREMITIES:  No pedal edema.  PSYCH:  Oriented x3.  CNS:  Speech intact.  Follows commands.  Motor and sensory intact.   LAB DATA:  A CT scan with IV contrast does not show any evidence of  abscess.  CBC - WBC 8.9, hemoglobin 14.6, hematocrit 43.0, platelet  count 248.  Sodium 138, potassium 4.2, chloride 104, bicarbonate 91, BUN  6, creatinine 0.9.   ASSESSMENT AND PLAN:  1. Facial cellulitis.  I will start the patient on IV Zosyn for broad-      spectrum antibiotic and do the blood culture, follow up in the a.m.      Once the infection settles down, she can get dental extraction      done.  2. Prophylaxis, deep venous thrombosis and gastrointestinal on board.  3. Code status:  The patient is full code.   Total time  spent on admission:  45 minutes.      Theodosia Paling, MD  Electronically Signed     NP/MEDQ  D:  10/13/2008  T:  10/13/2008  Job:  [id number]   cc:   Dr. Gerre Pebbles

## 2010-11-01 NOTE — Discharge Summary (Signed)
NAME:  Misty [name], Misty [name] NO.:  [id number]   MEDICAL RECORD NO.:  [id number]          PATIENT TYPE:  INP   LOCATION:  1315                         FACILITY:  [facility]   PHYSICIAN:  Hillery Aldo, M.D.   DATE OF BIRTH:  1975-06-22   DATE OF ADMISSION:  10/13/2008  DATE OF DISCHARGE:  10/16/2008                               DISCHARGE SUMMARY   PRIMARY CARE PHYSICIAN:  Dr. Celene Skeen at [facility] in [place], [place].   DISCHARGE DIAGNOSES:  1. Left facial cellulitis.  2. Acute pulpitis, tooth #13.  3. Extensive dental caries involving tooth #13.  4. Chronic periodontitis with bone loss.  5. Gingival recession.   DISCHARGE MEDICATIONS:  1. Augmentin 875 mg p.o. b.i.d. x1 week.  2. Vicodin 5/500 one to two tablets p.o. q.4 hours p.r.n. pain.   CONSULTATIONS:  Dr. Kristin Bruins of Dentistry.   BRIEF ADMISSION HISTORY OF PRESENT ILLNESS:  The patient is a 35-year-  old female who presents to the hospital with chief complaint of left-  sided facial swelling, tenderness, and pain involving the face and left  mandibular area.  The patient apparently had been seen earlier in the  week by a dentist and was prescribed penicillin.  She presented to the  hospital for evaluation secondary to worsening symptoms.  The patient  was subsequently referred to the hospital service for admission and  initiation of IV antibiotic therapy for facial cellulitis.  For the full  details, please see the dictated report done by Dr. Glade Lloyd.   PROCEDURES AND DIAGNOSTIC STUDIES:  1. CT of the maxillofacial area with contrast on October 13, 2008 showed      no acute findings and no evidence for abscess.  2. Orthopantogram on October 14, 2008 showed left posterior maxillary      molar (tooth #15) root abscess with cortical breakthrough and      associated facial cellulitis.  No other abnormality identified.   DISCHARGE LABORATORY VALUES:  Blood cultures were negative but are  preliminary.   Chemistries were completely normal with the exception of a  slightly elevated fasting glucose of 109.  White blood cell count was  6.6, hemoglobin 13.1, hematocrit 39.3, platelets 247.   HOSPITAL COURSE BY PROBLEM:  Facial cellulitis with dental abscess and  dental caries:  The patient was admitted and empirically put on Zosyn  for broad spectrum antibiotic coverage.  Blood cultures were obtained  and are negative at the time of this dictation.  Because of the  suspicion that her facial swelling was from a dental site, an  orthopantogram was obtained with the findings as noted above.  The  patient has completed 3 days of therapy with Zosyn.  Dr. Kristin Bruins  recommends Augmentin 875 mg b.i.d. for an additional 7 days.  The  patient does have dental  follow-up at her dentist's office next week where she plans to have a  root canal done.  The patient is stable for discharge, will be  discharged home.   Time spent coordinating care for discharge and discharge instructions  equals 20 minutes.  Hillery Aldo, M.D.  Electronically Signed     CR/MEDQ  D:  10/16/2008  T:  10/16/2008  Job:  [id number]   cc:   Dr. Celene Skeen  [facility]  [place], [place]

## 2010-11-04 NOTE — Op Note (Signed)
NAMEMarland Kitchen  Misty [name], Misty [name] NO.:  [id number]   MEDICAL RECORD NO.:  [id number]          PATIENT TYPE:  OBV   LOCATION:  0343                         FACILITY:  [facility]   PHYSICIAN:  Bertram Millard. Dahlstedt, M.D.DATE OF BIRTH:  1976/01/22   DATE OF PROCEDURE:  06/27/2004  DATE OF DISCHARGE:                                 OPERATIVE REPORT   PREOPERATIVE DIAGNOSIS:  Right renal stone, symptomatic right ureteral  stone.   POSTOPERATIVE DIAGNOSIS:  Right renal stone, symptomatic right ureteral  stone.   PROCEDURE:  Cystoscopy, right ureteroscopy, stone extraction, double J stent  placement, interoperative fluoroscopy.   SURGEON:  Bertram Millard. Dahlstedt, M.D.   ANESTHESIA:  General with LMA.   COMPLICATIONS:  None.   SPECIMENS:  Ureteral stone.   BRIEF HISTORY:  This nice 35 year old female presented to the emergency room  earlier this morning with significant right flank pain.  This pain has been  intermittent for several months.  She has been treated for right flank pain  and urinary tract infections over the past few months, as well.  She is  about 3-4 weeks postpartum.  She recently began having increase of this  right flank pain and presented to the emergency room  where she was  diagnosed with a 15 mm right renal stone in the periphery of the kidney and  an obstructing 7 mm right ureteral stone at the UVJ.  She subsequently  presented to my office.  She was found to have urinary tract infection.  Due  to the fact that the patient has an urinary tract infection and an  obstructing stone, it was recommended that we move urgently and try to  decompress her urinary tract.  We talked about stent placement versus  ureteroscopic stone extraction.  I recommended that we at least try to place  a stent if the stone is hard to access.  She was admitted earlier today and  presents for ureteroscopy and possible stone extraction.  She is aware of  the risks and complications of  the procedure.   DESCRIPTION OF PROCEDURE:  The patient was administered preoperative IV  antibiotics and taken to the operating room where general anesthetic was  administered using the LMA.  She was placed in the dorsal lithotomy  position.  Genitalia and perineum were prepped and draped.  A 6 French  panendoscope was advanced into her bladder and then up to the right ureteral  orifice.  The orifice was easily entered and the stone was encountered  approximately 1 cm up the ureter.  A Nitinol basket was used to grasp the  stone.  The stone was then extracted from the ureter without much  difficulty.  The stone was then put in a specimen jar and the ureteroscope  was then readvanced up into the ureter.  No stones were seen.  At this  point, a guide-wire was placed up into the right renal pelvis using  fluoroscopic guidance.  A cystoscope was then placed and a double J stent  was inserted over the top of  the guide-wire using fluoroscopic and cystoscopic guidance.  Good curls were  seen proximally and distally when the guide-wire was removed.  The bladder  was drained and the scope removed.  The patient tolerated the procedure  well.  She was awakened, extubated, and taken to the PACU in stable  condition.     Step   SMD/MEDQ  D:  06/27/2004  T:  06/27/2004  Job:  [id number]

## 2010-11-04 NOTE — Discharge Summary (Signed)
Misty [name], SERMONS     ACCOUNT NO.:  [id number]   MEDICAL RECORD NO.:  [id number]          PATIENT TYPE:  INP   LOCATION:  9123                          FACILITY:  [facility]   PHYSICIAN:  Zenaida Niece, M.D.DATE OF BIRTH:  Mar 26, 1976   DATE OF ADMISSION:  06/04/2004  DATE OF DISCHARGE:  06/07/2004                                 DISCHARGE SUMMARY   ADMISSION DIAGNOSES:  1.  Intrauterine pregnancy at 36+ weeks.  2.  Premature preterm rupture of membranes.  3.  Preterm labor.   DISCHARGE DIAGNOSES:  1.  Intrauterine pregnancy at 36+ weeks.  2.  Premature preterm rupture of membranes.  3.  Preterm labor.   PROCEDURE:  On December 18th, she had a spontaneous vaginal delivery.   HISTORY OF PRESENT ILLNESS:  This is a 35 year old black female, gravida 5,  para 3-0-1-3 with an EGA of 36+ weeks who presents with the complaint of  regular contractions and leaking fluid that is clear since approximately  2115 on the day of admission.  Evaluation in triage confirmed rupture of  membranes, and she was initially 4 cm dilated.  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   PRENATAL LABORATORY DATA:  Blood type O positive with a negative antibody  screen, sickle screen negative, RPR nonreactive, rubella immune, hepatitis B  surface antigen negative, HIV negative, gonorrhea and Chlamydia negative,  triple screen normal, one-hour Glucola 105.   PAST OBSTETRICAL HISTORY:  Three vaginal deliveries at 37 weeks, 5 pounds 11  ounces, 5 pounds 13 ounces, and 6 pounds 1 ounce.  One spontaneous abortion.   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   PAST MEDICAL HISTORY:  Remote history of asthma.   MEDICATIONS:  1.  Zantac.  2.  Valtrex.   PHYSICAL EXAMINATION:  VITAL SIGNS:  She is afebrile with stable vital  signs.  Fetal heart rate tracing is reactive with contractions every 2 to 4  minutes.  ABDOMEN:  Gravid, nontender, with a fundal height of 35 cm.  CERVIX:  On my first exam she was 4, 30, -1 and -2, with a vertex  presentation.   HOSPITAL COURSE:  The patient was admitted on started on penicillin for  group B strep prophylaxis.  She had a group B strep culture performed on  December 15th, but the results are not back yet.  She was also started on  Pitocin augmentation.  She progressed to complete and pushed very poorly,  but uterine effort worked well to push the baby out.  On the morning of  December 18th, she had a vaginal delivery of a viable female infant with  Apgars of 9 and 9 and weight of 6 pounds 8 ounces.  There was a loose nuchal  cord x1 which was reduced.  The placenta delivered spontaneously and was  intact.  The perineum was intact.  Estimated blood loss was less than 500  cc.   Postpartum, she had no significant complications, and on postpartum day #2  she was stable for discharge home.   DISCHARGE INSTRUCTIONS:  1.  Regular diet.  2.  Pelvic rest.  3.  Follow up in six weeks.  4.  She was given our discharge pamphlet.   DISCHARGE MEDICATIONS:  Over-the-counter ibuprofen as needed.     Todd   TDM/MEDQ  D:  06/07/2004  T:  06/08/2004  Job:  [id number]

## 2010-11-04 NOTE — Discharge Summary (Signed)
Misty [name], Misty [name]                      ACCOUNT NO.:  [id number]   MEDICAL RECORD NO.:  [id number]                   PATIENT TYPE:  INP   LOCATION:  9110                                 FACILITY:  [facility]   PHYSICIAN:  Malachi Pro. Ambrose Mantle, M.D.              DATE OF BIRTH:  1975-07-09   DATE OF ADMISSION:  06/02/2002  DATE OF DISCHARGE:  06/04/2002                                 DISCHARGE SUMMARY   HISTORY OF PRESENT ILLNESS:  A 35 year old black female para 2-0-1-2,  gravida 4, estimated gestational age [redacted] weeks and 4 days by an 8 week  ultrasound with EDC of June 19, 2002 presented on the morning of admission  with painful contractions every two to three minutes.  No vaginal bleeding  or ruptured membranes.  Good fetal movement.  Vaginal examination in  maternity admissions showed the cervix to be 3 cm with regular contractions.  The patient was admitted and received an epidural.  Her contractions spaced  out and there was no significant cervical change.  Blood group and type O+  with a negative antibody.  Sickle cell negative.  RPR nonreactive.  Rubella  equivocal.  Hepatitis B surface antigen.  HIV negative.  GC and Chlamydia  negative.  Triple screen normal.  One hour Glucola 93.   PRENATAL COURSE:  The patient had nausea and vomiting of pregnancy treated  with Phenergan and Zofran.  She had a first trimester UTI with Proteus  treated with Bactrim DS.  She was seen in the emergency room at 11-[redacted] weeks  gestation with abdominal pain, nausea, and vomiting.  She had Candida at 21  weeks treated with Terazol, anemia treated with iron.   OBSTETRIC HISTORY:  She had spontaneous vaginal deliveries in 1995 and 1999  of 5 pound 13 ounce and 5 pound 11 ounce infants.  She had a spontaneous  abortion in 1996.   PAST MEDICAL HISTORY:  Asthma as a child.   PAST SURGICAL HISTORY:  None.   ALLERGIES:  No known drug allergies.   MEDICATIONS:  Iron.   SOCIAL HISTORY:  The patient is  single.  Father of the baby was present.  No  history of tobacco.   FAMILY HISTORY:  Negative.   PHYSICAL EXAMINATION:  VITAL SIGNS:  Normal vital signs.  Fetal heart tones  reactive.  Contractions every four minutes.  ABDOMEN:  Gravid and nontender.  Estimated fetal weight about 6 pounds.  PELVIC:  Cervix was 4 cm, 50%, vertex at a -1.   LABORATORIES:  The patient's group B Strep status was negative.   HOSPITAL COURSE:  The patient was placed on Pitocin at approximately 2 p.m.  Her contractions increased.  She progressed rapidly with full dilatation,  delivered spontaneously OA over an intact perineum by Dr. Ambrose Mantle a living  female infant 6 pound 1 ounce, Apgars of 8 at one and 9 at five minutes.  There were three  loops of nuchal cord.  Placenta was intact, uterus normal.  Blood loss about 400 cc.  Postpartum the patient did well and was discharged  in the second postpartum day.  She had complained during the pregnancy of  some pain along a tendon insertion into her pubic bone.  This, again, was  causing some problem.  I did examine her.  The groin was negative but there  was tenderness of the tendons inserting muscle into her lateral aspect of  the pubis.  She was advised rest and Motrin.   LABORATORY DATA:  Hemoglobin was 11.6, hematocrit 35.6, white count 6700,  platelet count 238,000.  Follow-up hemoglobin 11.0.  RPR was nonreactive.  Rubella was again equivocal at 8.6 with immunity being greater than 10.  The  patient is offered the rubella vaccine.   FINAL DIAGNOSES:  Intrauterine pregnancy at 37+ weeks, delivered.   OPERATION:  Spontaneous delivery vertex.   FINAL CONDITION:  Improved.   INSTRUCTIONS:  Regular discharge instruction booklet.  The patient is given  a prescription for Motrin 800 mg 20 tablets one q.8h. as needed for pain.  She is offered the rubella vaccine.                                               Malachi Pro. Ambrose Mantle, M.D.    TFH/MEDQ  D:   06/04/2002  T:  06/04/2002  Job:  [id number]

## 2010-11-04 NOTE — Discharge Summary (Signed)
NAMEMarland [name]  SEVERA, JEREMIAH NO.:  [id number]   MEDICAL RECORD NO.:  [id number]          PATIENT TYPE:  INP   LOCATION:  9133                          FACILITY:  [facility]   PHYSICIAN:  Zenaida Niece, M.D.DATE OF BIRTH:  [birth year]   DATE OF ADMISSION:  03/13/2006  DATE OF DISCHARGE:  03/16/2006                                 DISCHARGE SUMMARY   ADMISSION DIAGNOSIS:  Intrauterine pregnancy at 38 weeks.   DISCHARGE DIAGNOSIS:  Intrauterine pregnancy at 38 weeks.   PROCEDURES:  On March 14, 2006, she had a spontaneous vaginal delivery.   HISTORY AND PHYSICAL:  This is a 35 year old black female gravida 6, para 3-  1-1-4 with an EGA of [redacted] weeks who is admitted in early labor.  Prenatal care  complicated by a near syncopal episode for which she was seen by Dr. [place]  and no heart problems identified.  She was also treated with Macrobid for a  UTI and with iron for anemia.   PRENATAL LABS:  Blood type is O+ with negative antibody screen, hemoglobin  electrophoresis is normal, RPR nonreactive, rubella immune, hepatitis B  surface antigen negative, gonorrhea and chlamydia negative.  Quad screen is  normal, 1-hour Glucola 116, group B strep is negative.   PAST OB HISTORY:  Three vaginal deliveries at 37 weeks and one vaginal  delivery at 36 weeks and one spontaneous abortion.   PAST MEDICAL HISTORY:  History of postpartum depression and asthma.   ALLERGIES:  To LATEX.   PHYSICAL EXAM:  VITAL SIGNS:  She is afebrile with stable vital signs. Fetal  heart tracing was normal.  ABDOMEN:  Gravid with a fundal height 36 cm.  PELVIC:  Cervix on Dr. Ebony Hail first exam was 3, 40, -3, vertex  presentation and amniotomy revealed clear fluid.   HOSPITAL COURSE:  The patient was admitted and Dr. Ambrose Mantle performed  amniotomy and she is also put on Pitocin.  She progressed to complete,  pushed well on the morning of September 26 had a vaginal delivery of a  viable female infant  with Apgars of 08/09 weighed 7 pounds 9 ounces.  Placenta  delivered spontaneously, intact and uterus palpated normal.  She had a  shallow perineal laceration repaired with 3-0 Vicryl.  Estimated blood loss  was 500 mL.  Postpartum she had no significant complications and on  postpartum day #2 was stable for discharge home.   DISCHARGE INSTRUCTIONS:  Regular diet, pelvic rest, follow-up in 6 weeks.  Medications are over-the-counter ibuprofen as needed and she is given our  discharge pamphlet.      Zenaida Niece, M.D.  Electronically Signed     TDM/MEDQ  D:  04/03/2006  T:  04/03/2006  Job:  [id number]

## 2011-10-10 ENCOUNTER — Other Ambulatory Visit (HOSPITAL_COMMUNITY): Payer: Self-pay | Admitting: Obstetrics and Gynecology

## 2011-10-10 DIAGNOSIS — O3680X0 Pregnancy with inconclusive fetal viability, not applicable or unspecified: Secondary | ICD-10-CM

## 2011-10-10 DIAGNOSIS — N200 Calculus of kidney: Secondary | ICD-10-CM

## 2011-10-11 ENCOUNTER — Ambulatory Visit (HOSPITAL_COMMUNITY)
Admission: RE | Admit: 2011-10-11 | Discharge: 2011-10-11 | Disposition: A | Discharge status: Home / Self Care | Payer: 59 | Source: Ambulatory Visit | Attending: Obstetrics and Gynecology | Admitting: Obstetrics and Gynecology

## 2011-10-11 DIAGNOSIS — O99891 Other specified diseases and conditions complicating pregnancy: Secondary | ICD-10-CM | POA: Insufficient documentation

## 2011-10-11 DIAGNOSIS — O3680X0 Pregnancy with inconclusive fetal viability, not applicable or unspecified: Secondary | ICD-10-CM | POA: Insufficient documentation

## 2011-10-11 DIAGNOSIS — N949 Unspecified condition associated with female genital organs and menstrual cycle: Secondary | ICD-10-CM | POA: Insufficient documentation

## 2011-10-11 DIAGNOSIS — N200 Calculus of kidney: Secondary | ICD-10-CM | POA: Insufficient documentation

## 2011-10-31 ENCOUNTER — Emergency Department (HOSPITAL_COMMUNITY)
Admission: EM | Admit: 2011-10-31 | Discharge: 2011-11-01 | Disposition: A | Discharge status: Home / Self Care | Payer: 59 | Attending: Emergency Medicine | Admitting: Emergency Medicine

## 2011-10-31 ENCOUNTER — Encounter (HOSPITAL_COMMUNITY): Payer: Self-pay | Admitting: *Deleted

## 2011-10-31 DIAGNOSIS — G43909 Migraine, unspecified, not intractable, without status migrainosus: Secondary | ICD-10-CM | POA: Insufficient documentation

## 2011-10-31 DIAGNOSIS — R112 Nausea with vomiting, unspecified: Secondary | ICD-10-CM | POA: Insufficient documentation

## 2011-10-31 DIAGNOSIS — H538 Other visual disturbances: Secondary | ICD-10-CM | POA: Insufficient documentation

## 2011-10-31 MED ORDER — METOCLOPRAMIDE HCL 5 MG/ML IJ SOLN
10.0000 mg | Freq: Once | INTRAMUSCULAR | Status: AC
  Administered 2011-10-31: 10 mg via INTRAVENOUS
  Filled 2011-10-31: qty 2

## 2011-10-31 MED ORDER — SODIUM CHLORIDE 0.9 % IV BOLUS (SEPSIS)
1000.0000 mL | Freq: Once | INTRAVENOUS | Status: AC
  Administered 2011-10-31: 1000 mL via INTRAVENOUS

## 2011-10-31 MED ORDER — DIPHENHYDRAMINE HCL 50 MG/ML IJ SOLN
25.0000 mg | Freq: Once | INTRAMUSCULAR | Status: AC
  Administered 2011-10-31: 25 mg via INTRAVENOUS
  Filled 2011-10-31: qty 1

## 2011-10-31 MED ORDER — KETOROLAC TROMETHAMINE 30 MG/ML IJ SOLN
30.0000 mg | Freq: Once | INTRAMUSCULAR | Status: AC
  Administered 2011-10-31: 30 mg via INTRAVENOUS
  Filled 2011-10-31: qty 1

## 2011-10-31 NOTE — ED Notes (Signed)
Pt states she started having n/v today. Pt states she then started to see spots and have a headache. Pt denies any injury

## 2011-10-31 NOTE — ED Notes (Signed)
Pt states she has photophobia and requests that light be turned off.

## 2011-10-31 NOTE — ED Notes (Signed)
Pt states that she doesn't want dilaudid at all, it makes her very nauseated

## 2011-10-31 NOTE — ED Provider Notes (Signed)
History     CSN: [id number]  Arrival date & time 10/31/11  2137   First MD Initiated Contact with Patient 10/31/11 2240      Chief Complaint  Patient presents with  . Nausea  . Emesis  . Headache    (Consider location/radiation/quality/duration/timing/severity/associated sxs/prior treatment) HPI Presents emergency department with headache, nausea, and vomiting and some mild blurred vision.  She, says she has had a history of migraines in the past, but has not had one in about 2 years.  She states the last time that she had a migraine she was unable to walk.  Patient denies chest pain, shortness of breath, dizziness, weakness, numbness, fever or neck pain.  Patient, states she took take ibuprofen without relief.  She, states that the light really been makes her headache, worse. History reviewed. No pertinent past medical history.  History reviewed. No pertinent past surgical history.  No family history on file.  History  Substance Use Topics  . Smoking status: Never Smoker   . Smokeless tobacco: Not on file  . Alcohol Use: No    OB History    Grav Para Term Preterm Abortions TAB SAB Ect Mult Living                  Review of Systems All other systems negative except as documented in the HPI. All pertinent positives and negatives as reviewed in the HPI.  Allergies  Ceftriaxone sodium and Latex  Home Medications   Current Outpatient Rx  Name Route Sig Dispense Refill  . LISDEXAMFETAMINE DIMESYLATE 60 MG PO CAPS Oral Take 60 mg by mouth every morning.      BP 132/72  Pulse 69  Temp(Src) 98.2 F (36.8 C) (Oral)  Resp 16  SpO2 98%  LMP 10/23/2011  Physical Exam Physical Examination: General appearance - alert, well appearing, and in no distress, oriented to person, place, and time and normal appearing weight Mental status - alert, oriented to person, place, and time, normal mood, behavior, speech, dress, motor activity, and thought processes Eyes - pupils equal  and reactive, extraocular eye movements intact Ears - bilateral TM's and external ear canals normal Nose - normal and patent, no erythema, discharge or polyps Mouth - mucous membranes moist, pharynx normal without lesions Neck - supple, no significant adenopathy Heart - normal rate, regular rhythm, normal S1, S2, no murmurs, rubs, clicks or gallops Neurological - alert, oriented, normal speech, no focal findings or movement disorder noted  ED Course  Procedures (including critical care time)  Patient is given IV fluids and Toradol, Reglan, and Benadryl and is completely pain-free at this time and is sleeping.  Told to return here as needed, for any worsening or condition.  Follow up with her primary care Dr. for recheck   MDM          Carlyle Dolly, PA-C 11/01/11 0000

## 2011-10-31 NOTE — ED Notes (Signed)
Pt states she started seeing spots out of her L eye and got nauseous and now has a headache. Has hx of migraine headache.

## 2011-11-01 NOTE — Discharge Instructions (Signed)
Return here as needed. Follow up with your doctor as needed. Increase your fluids.

## 2011-11-01 NOTE — ED Notes (Signed)
Pt dc;d NADN.  Pt verbalizes understanding.  Family at side

## 2011-11-02 NOTE — ED Provider Notes (Signed)
Medical screening examination/treatment/procedure(s) were performed by non-physician practitioner and as supervising physician I was immediately available for consultation/collaboration.  Salik Grewell, MD 11/02/11 0624 

## 2012-04-10 ENCOUNTER — Emergency Department (HOSPITAL_COMMUNITY)
Admission: EM | Admit: 2012-04-10 | Discharge: 2012-04-10 | Disposition: A | Discharge status: Home / Self Care | Payer: 59 | Attending: Emergency Medicine | Admitting: Emergency Medicine

## 2012-04-10 ENCOUNTER — Encounter (HOSPITAL_COMMUNITY): Payer: Self-pay | Admitting: Emergency Medicine

## 2012-04-10 DIAGNOSIS — Z87442 Personal history of urinary calculi: Secondary | ICD-10-CM | POA: Insufficient documentation

## 2012-04-10 DIAGNOSIS — L03119 Cellulitis of unspecified part of limb: Secondary | ICD-10-CM | POA: Insufficient documentation

## 2012-04-10 DIAGNOSIS — L02416 Cutaneous abscess of left lower limb: Secondary | ICD-10-CM

## 2012-04-10 DIAGNOSIS — L02419 Cutaneous abscess of limb, unspecified: Secondary | ICD-10-CM | POA: Insufficient documentation

## 2012-04-10 MED ORDER — IBUPROFEN 800 MG PO TABS
800.0000 mg | ORAL_TABLET | Freq: Once | ORAL | Status: AC
  Administered 2012-04-10: 800 mg via ORAL
  Filled 2012-04-10: qty 1

## 2012-04-10 MED ORDER — LIDOCAINE-EPINEPHRINE 2 %-1:100000 IJ SOLN
20.0000 mL | Freq: Once | INTRAMUSCULAR | Status: AC
  Administered 2012-04-10: 2 mL via INTRADERMAL
  Filled 2012-04-10: qty 1

## 2012-04-10 MED ORDER — HYDROCODONE-ACETAMINOPHEN 5-500 MG PO TABS: 1.0000 | ORAL_TABLET | Freq: Four times a day (QID) | ORAL | Status: DC | PRN

## 2012-04-10 NOTE — ED Notes (Signed)
Pt states that she has had an abscess since Saturday.  Abscess is on left lower anterior leg.  Has gotten bigger.  Redness is about 3.5 inches in diameter.

## 2012-04-10 NOTE — ED Provider Notes (Signed)
Medical screening examination/treatment/procedure(s) were performed by non-physician practitioner and as supervising physician I was immediately available for consultation/collaboration.  Marwan T Powers, MD 04/10/12 1652 

## 2012-04-10 NOTE — ED Provider Notes (Signed)
History     CSN: [id number]  Arrival date & time 04/10/12  0840   First MD Initiated Contact with Patient 04/10/12 [phone]      Chief Complaint  Patient presents with  . Abscess    (Consider location/radiation/quality/duration/timing/severity/associated sxs/prior treatment) HPI  36 year old female presents for evaluations of abscess. Patient reports for the past 3 days she has had increased pain and swelling to the left lower leg. Onset was gradual, persistent, nonradiating, moderate in severity.  She also notice redness and warmth to the associated region. No fever, or chills, headache, or abdominal pain. No recent trauma. No chest pain or shortness of breath. No prior history of abscess.  Past Medical History  Diagnosis Date  . Kidney stones     Past Surgical History  Procedure Date  . Lithotripsy   . Kidney surgery     History reviewed. No pertinent family history.  History  Substance Use Topics  . Smoking status: Never Smoker   . Smokeless tobacco: Not on file  . Alcohol Use: Yes    OB History    Grav Para Term Preterm Abortions TAB SAB Ect Mult Living                  Review of Systems  Constitutional: Negative for fever.  Musculoskeletal: Negative for joint swelling and arthralgias.  Skin: Positive for rash.  Neurological: Negative for numbness.    Allergies  Ceftriaxone sodium and Latex  Home Medications   Current Outpatient Rx  Name Route Sig Dispense Refill  . LISDEXAMFETAMINE DIMESYLATE 60 MG PO CAPS Oral Take 60 mg by mouth every morning.      BP 133/86  Pulse 113  Temp 98.7 F (37.1 C) (Oral)  Resp 18  SpO2 100%  LMP 02/09/2012  Physical Exam  Nursing note and vitals reviewed. Constitutional: She is oriented to person, place, and time. She appears well-developed. No distress.  HENT:  Head: Atraumatic.  Eyes: Conjunctivae normal are normal.  Neck: Neck supple.  Musculoskeletal: Normal range of motion. She exhibits tenderness (Left  low anterior neck with an area of induration and fluctuance, tender to palpation. Surrounding erythema 3.5 inch in diameter. Non-petechial,  or vesicular). She exhibits no edema.  Neurological: She is alert and oriented to person, place, and time.  Skin: Skin is warm.    ED Course  Procedures (including critical care time)  Labs Reviewed - No data to display No results found.   No diagnosis found.  INCISION AND DRAINAGE Performed by: Fayrene Helper Consent: Verbal consent obtained. Risks and benefits: risks, benefits and alternatives were discussed Type: abscess  Body area: L anterior tibia region  Anesthesia: local infiltration  Local anesthetic: lidocaine 2% w epinephrine  Anesthetic total: 3 ml  Complexity: complex Blunt dissection to break up loculations  Drainage: purulent  Drainage amount: mild  Packing material: 1/4 in iodoform gauze  Patient tolerance: Patient tolerated the procedure well with no immediate complications.  1. Abscess cellulitis of L lower leg   MDM  Abscess cellulitis on L lower anterior leg with successful I&D.  Care instruction include soap/water cleanse, warm compress were discussed.  Pt has keflex and doxy at home which i encourage to continue.  Recommend return in 2 days for packing removal and wound recheck.    BP 133/86  Pulse 113  Temp 98.7 F (37.1 C) (Oral)  Resp 18  SpO2 100%  LMP 02/09/2012  Nursing notes reviewed and considered in documentation  Previous records reviewed and  considered        Fayrene Helper, PA-C 04/10/12 [phone]

## 2012-07-04 ENCOUNTER — Encounter (HOSPITAL_BASED_OUTPATIENT_CLINIC_OR_DEPARTMENT_OTHER)
Admission: RE | Disposition: A | Discharge status: Home / Self Care | Payer: Self-pay | Source: Ambulatory Visit | Attending: Urology

## 2012-07-04 ENCOUNTER — Encounter (HOSPITAL_BASED_OUTPATIENT_CLINIC_OR_DEPARTMENT_OTHER): Payer: Self-pay | Admitting: *Deleted

## 2012-07-04 ENCOUNTER — Ambulatory Visit (HOSPITAL_BASED_OUTPATIENT_CLINIC_OR_DEPARTMENT_OTHER): Payer: 59 | Admitting: Anesthesiology

## 2012-07-04 ENCOUNTER — Other Ambulatory Visit: Payer: Self-pay | Admitting: Urology

## 2012-07-04 ENCOUNTER — Encounter (HOSPITAL_BASED_OUTPATIENT_CLINIC_OR_DEPARTMENT_OTHER): Payer: Self-pay | Admitting: Anesthesiology

## 2012-07-04 ENCOUNTER — Ambulatory Visit (HOSPITAL_BASED_OUTPATIENT_CLINIC_OR_DEPARTMENT_OTHER)
Admission: RE | Admit: 2012-07-04 | Discharge: 2012-07-04 | Disposition: A | Discharge status: Home / Self Care | Payer: 59 | Source: Ambulatory Visit | Attending: Urology | Admitting: Urology

## 2012-07-04 DIAGNOSIS — N12 Tubulo-interstitial nephritis, not specified as acute or chronic: Secondary | ICD-10-CM | POA: Insufficient documentation

## 2012-07-04 DIAGNOSIS — N201 Calculus of ureter: Secondary | ICD-10-CM | POA: Insufficient documentation

## 2012-07-04 DIAGNOSIS — Z87442 Personal history of urinary calculi: Secondary | ICD-10-CM

## 2012-07-04 HISTORY — PX: CYSTOSCOPY WITH STENT PLACEMENT: SHX5790

## 2012-07-04 SURGERY — CYSTOSCOPY, WITH STENT INSERTION
Anesthesia: General | Site: Ureter | Laterality: Right | Wound class: Clean Contaminated

## 2012-07-04 MED ORDER — OXYCODONE HCL 5 MG/5ML PO SOLN
5.0000 mg | Freq: Once | ORAL | Status: DC | PRN
Start: 2012-07-04 — End: 2012-07-04
  Filled 2012-07-04: qty 5

## 2012-07-04 MED ORDER — SODIUM CHLORIDE 0.9 % IJ SOLN
3.0000 mL | Freq: Two times a day (BID) | INTRAMUSCULAR | Status: DC
  Filled 2012-07-04: qty 3

## 2012-07-04 MED ORDER — LACTATED RINGERS IV SOLN
INTRAVENOUS | Status: DC
  Filled 2012-07-04: qty 1000

## 2012-07-04 MED ORDER — ACETAMINOPHEN 325 MG PO TABS
650.0000 mg | ORAL_TABLET | ORAL | Status: DC | PRN
  Filled 2012-07-04: qty 2

## 2012-07-04 MED ORDER — ACETAMINOPHEN 10 MG/ML IV SOLN
1000.0000 mg | Freq: Once | INTRAVENOUS | Status: DC | PRN
  Filled 2012-07-04: qty 100

## 2012-07-04 MED ORDER — STERILE WATER FOR IRRIGATION IR SOLN
Status: DC | PRN
  Administered 2012-07-04: 3000 mL

## 2012-07-04 MED ORDER — CIPROFLOXACIN IN D5W 400 MG/200ML IV SOLN
400.0000 mg | INTRAVENOUS | Status: AC
  Administered 2012-07-04: 400 mg via INTRAVENOUS
  Filled 2012-07-04: qty 200

## 2012-07-04 MED ORDER — KETOROLAC TROMETHAMINE 15 MG/ML IJ SOLN
15.0000 mg | Freq: Four times a day (QID) | INTRAMUSCULAR | Status: DC
  Filled 2012-07-04: qty 1

## 2012-07-04 MED ORDER — ONDANSETRON HCL 4 MG/2ML IJ SOLN
INTRAMUSCULAR | Status: DC | PRN
  Administered 2012-07-04: 4 mg via INTRAVENOUS

## 2012-07-04 MED ORDER — MEPERIDINE HCL 25 MG/ML IJ SOLN
6.2500 mg | INTRAMUSCULAR | Status: DC | PRN
  Filled 2012-07-04: qty 1

## 2012-07-04 MED ORDER — SODIUM CHLORIDE 0.9 % IJ SOLN
3.0000 mL | INTRAMUSCULAR | Status: DC | PRN
  Filled 2012-07-04: qty 3

## 2012-07-04 MED ORDER — ACETAMINOPHEN 650 MG RE SUPP
650.0000 mg | RECTAL | Status: DC | PRN
  Filled 2012-07-04: qty 1

## 2012-07-04 MED ORDER — CIPROFLOXACIN HCL 500 MG PO TABS: 500.0000 mg | ORAL_TABLET | Freq: Two times a day (BID) | ORAL | Status: DC

## 2012-07-04 MED ORDER — OXYCODONE HCL 5 MG PO TABS
5.0000 mg | ORAL_TABLET | Freq: Once | ORAL | Status: DC | PRN
  Filled 2012-07-04: qty 1

## 2012-07-04 MED ORDER — DEXAMETHASONE SODIUM PHOSPHATE 4 MG/ML IJ SOLN
INTRAMUSCULAR | Status: DC | PRN
  Administered 2012-07-04: 10 mg via INTRAVENOUS

## 2012-07-04 MED ORDER — PROPOFOL 10 MG/ML IV BOLUS
INTRAVENOUS | Status: DC | PRN
  Administered 2012-07-04: 200 mg via INTRAVENOUS

## 2012-07-04 MED ORDER — SODIUM CHLORIDE 0.9 % IV SOLN
250.0000 mL | INTRAVENOUS | Status: DC | PRN
  Filled 2012-07-04: qty 250

## 2012-07-04 MED ORDER — KETOROLAC TROMETHAMINE 30 MG/ML IJ SOLN
INTRAMUSCULAR | Status: DC | PRN
  Administered 2012-07-04: 30 mg via INTRAVENOUS

## 2012-07-04 MED ORDER — PROMETHAZINE HCL 25 MG/ML IJ SOLN
6.2500 mg | INTRAMUSCULAR | Status: DC | PRN
  Filled 2012-07-04: qty 1

## 2012-07-04 MED ORDER — IOHEXOL 350 MG/ML SOLN
INTRAVENOUS | Status: DC | PRN
  Administered 2012-07-04: 10 mL via INTRAVENOUS

## 2012-07-04 MED ORDER — FENTANYL CITRATE 0.05 MG/ML IJ SOLN
INTRAMUSCULAR | Status: DC | PRN
  Administered 2012-07-04: 50 ug via INTRAVENOUS
  Administered 2012-07-04: 100 ug via INTRAVENOUS
  Administered 2012-07-04 (×2): 25 ug via INTRAVENOUS

## 2012-07-04 MED ORDER — LIDOCAINE HCL (CARDIAC) 20 MG/ML IV SOLN
INTRAVENOUS | Status: DC | PRN
  Administered 2012-07-04: 100 mg via INTRAVENOUS

## 2012-07-04 MED ORDER — HYDROCODONE-ACETAMINOPHEN 5-500 MG PO CAPS: 1.0000 | ORAL_CAPSULE | ORAL | Status: DC | PRN

## 2012-07-04 MED ORDER — LACTATED RINGERS IV SOLN
INTRAVENOUS | Status: DC | PRN
  Administered 2012-07-04: 14:00:00 via INTRAVENOUS

## 2012-07-04 MED ORDER — MORPHINE SULFATE 2 MG/ML IJ SOLN
1.0000 mg | INTRAMUSCULAR | Status: DC | PRN
  Filled 2012-07-04: qty 1

## 2012-07-04 MED ORDER — HYDROMORPHONE HCL PF 1 MG/ML IJ SOLN
0.2500 mg | INTRAMUSCULAR | Status: DC | PRN
  Administered 2012-07-04: 0.25 mg via INTRAVENOUS
  Filled 2012-07-04: qty 1

## 2012-07-04 MED ORDER — MIDAZOLAM HCL 5 MG/5ML IJ SOLN
INTRAMUSCULAR | Status: DC | PRN
  Administered 2012-07-04: 2 mg via INTRAVENOUS

## 2012-07-04 MED ORDER — ONDANSETRON HCL 4 MG/2ML IJ SOLN
4.0000 mg | Freq: Four times a day (QID) | INTRAMUSCULAR | Status: DC | PRN
  Filled 2012-07-04: qty 2

## 2012-07-04 SURGICAL SUPPLY — 22 items
ADAPTER CATH URET PLST 4-6FR (CATHETERS) IMPLANT
ADPR CATH URET STRL DISP 4-6FR (CATHETERS)
BAG DRAIN URO-CYSTO SKYTR STRL (DRAIN) ×2 IMPLANT
BAG DRN UROCATH (DRAIN) ×1
CANISTER SUCT LVC 12 LTR MEDI- (MISCELLANEOUS) ×1 IMPLANT
CATH INTERMIT  6FR 70CM (CATHETERS) IMPLANT
CATH URET 5FR 28IN CONE TIP (BALLOONS)
CATH URET 5FR 28IN OPEN ENDED (CATHETERS) IMPLANT
CATH URET 5FR 70CM CONE TIP (BALLOONS) IMPLANT
CLOTH BEACON ORANGE TIMEOUT ST (SAFETY) ×2 IMPLANT
DRAPE CAMERA CLOSED 9X96 (DRAPES) ×2 IMPLANT
GLOVE BIO SURGEON STRL SZ8 (GLOVE) ×2 IMPLANT
GOWN PREVENTION PLUS LG XLONG (DISPOSABLE) ×2 IMPLANT
GOWN PREVENTION PLUS XLARGE (GOWN DISPOSABLE) ×1 IMPLANT
GOWN STRL NON-REIN LRG LVL3 (GOWN DISPOSABLE) ×1 IMPLANT
GOWN STRL REIN XL XLG (GOWN DISPOSABLE) ×2 IMPLANT
GUIDEWIRE 0.038 PTFE COATED (WIRE) IMPLANT
GUIDEWIRE ANG ZIPWIRE 038X150 (WIRE) IMPLANT
GUIDEWIRE STR DUAL SENSOR (WIRE) ×1 IMPLANT
NS IRRIG 500ML POUR BTL (IV SOLUTION) IMPLANT
PACK CYSTOSCOPY (CUSTOM PROCEDURE TRAY) ×2 IMPLANT
STENT URET 6FRX24 CONTOUR (STENTS) ×1 IMPLANT

## 2012-07-04 NOTE — Transfer of Care (Signed)
Immediate Anesthesia Transfer of Care Note  Patient: Misty [name]  Procedure(s) Performed: Procedure(s) (LRB): CYSTOSCOPY WITH STENT PLACEMENT (Right)  Patient Location: PACU  Anesthesia Type: General  Level of Consciousness: awake, sedated, patient cooperative and responds to stimulation  Airway & Oxygen Therapy: Patient Spontanous Breathing and Patient connected to face mask oxygen  Post-op Assessment: Report given to PACU RN, Post -op Vital signs reviewed and stable and Patient moving all extremities  Post vital signs: Reviewed and stable  Complications: No apparent anesthesia complications

## 2012-07-04 NOTE — Anesthesia Preprocedure Evaluation (Signed)
Anesthesia Evaluation  Patient identified by MRN, date of birth, ID band Patient awake    Reviewed: Allergy & Precautions, H&P , NPO status , Patient's Chart, lab work & pertinent test results  Airway Mallampati: II TM Distance: >3 FB Neck ROM: Full    Dental  (+) Dental Advisory Given, Missing and Partial Upper   Pulmonary neg pulmonary ROS,  breath sounds clear to auscultation  Pulmonary exam normal       Cardiovascular - Past MI and - CHF Rhythm:Regular Rate:Normal     Neuro/Psych negative neurological ROS  negative psych ROS   GI/Hepatic negative GI ROS, Neg liver ROS,   Endo/Other  negative endocrine ROS  Renal/GU Renal disease     Musculoskeletal negative musculoskeletal ROS (+)   Abdominal   Peds  Hematology negative hematology ROS (+)   Anesthesia Other Findings   Reproductive/Obstetrics                           Anesthesia Physical Anesthesia Plan  ASA: I  Anesthesia Plan: General   Post-op Pain Management:    Induction: Intravenous  Airway Management Planned: LMA  Additional Equipment:   Intra-op Plan:   Post-operative Plan: Extubation in OR  Informed Consent: I have reviewed the patients History and Physical, chart, labs and discussed the procedure including the risks, benefits and alternatives for the proposed anesthesia with the patient or authorized representative who has indicated his/her understanding and acceptance.   Dental advisory given  Plan Discussed with: CRNA  Anesthesia Plan Comments:         Anesthesia Quick Evaluation

## 2012-07-04 NOTE — Anesthesia Postprocedure Evaluation (Signed)
  Anesthesia Post-op Note  Patient: Misty [name]  Procedure(s) Performed: Procedure(s) (LRB): CYSTOSCOPY WITH STENT PLACEMENT (Right)  Patient Location: PACU  Anesthesia Type: General  Level of Consciousness: awake and alert   Airway and Oxygen Therapy: Patient Spontanous Breathing  Post-op Pain: mild  Post-op Assessment: Post-op Vital signs reviewed, Patient's Cardiovascular Status Stable, Respiratory Function Stable, Patent Airway and No signs of Nausea or vomiting  Last Vitals:  Filed Vitals:   07/04/12 1440  BP: 114/76  Pulse:   Temp: 36.7 C  Resp: 20    Post-op Vital Signs: stable   Complications: No apparent anesthesia complications

## 2012-07-04 NOTE — H&P (Signed)
  H&P  Chief Complaint: Right-sided kidney stone  History of Present Illness: Misty [name] is a 37 y.o. year old female with a prior history of urolithiasis who comes in today complaining of chills, significant flank pain and lower urinary tract symptoms. Evaluation reveals the patient to have a moderate size stone in her right proximal ureter and a UTI.she has had chills, but no significant fever. Because of her combination of an obstructing ureteral calculus and UTI, it was recommended that she undergo urgent stent placement. She was given gentamicin in the office, and presents at this time for that procedure.  Past Medical History  Diagnosis Date  . Kidney stones     Past Surgical History  Procedure Date  . Lithotripsy   . Kidney surgery     Home Medications:  No prescriptions prior to admission    Allergies:  Allergies  Allergen Reactions  . Ceftriaxone Sodium   . Latex     No family history on file.  Social History:  reports that she has never smoked. She does not have any smokeless tobacco history on file. She reports that she drinks alcohol. She reports that she does not use illicit drugs.  ROS: A complete review of systems was performed.  All systems are negative except for pertinent findings as noted.  Physical Exam:  Vital signs in last 24 hours:   General:  Alert and oriented, No acute distress HEENT: Normocephalic, atraumatic Neck: No JVD or lymphadenopathy Cardiovascular: Regular rate and rhythm Lungs: Clear bilaterally Abdomen: Soft, with moderate right lower quadrant, right upper quadrant right CVA tenderness. Back: No CVA tenderness Extremities: No edema Neurologic: Grossly intact  Laboratory Data:  No results found for this or any previous visit (from the past 24 hour(s)). No results found for this or any previous visit (from the past 240 hour(s)). Creatinine: No results found for this basename: CREATININE:7 in the last 168 hours  Radiologic  Imaging: No results found.  Impression/Assessment:  1. Moderate sized right mid ureteral calculus  2. UTI  Plan:  Urgent cystoscopy and stent placement  [name] 07/04/2012, 12:01 PM  Bertram Millard. Concepcion Gillott MD

## 2012-07-04 NOTE — Op Note (Signed)
Preoperative diagnosis:  1. Right ureteral stone with pyonephrosis  Postoperative diagnosis:  1. same   Procedure:  1. Cystoscopy 2. right ureteral stent placement (24 cmx 23fr contour w/o string)  Surgeon: Bertram Millard. Sharone Almond  M.D.  Anesthesia: General  Complications: None  JXB:JYNW  Specimens: None  Indication: Misty [name] is a 37 y.o. patient with an infected/obstructing right midureteral stone.. After reviewing the management options for treatment, he/she elected to proceed with the above surgical procedure(s). We have discussed the potential benefits and risks of the procedure, side effects of the proposed treatment, the likelihood of the patient achieving the goals of the procedure, and any potential problems that might occur during the procedure or recuperation. Informed consent has been obtained.  Description of procedure:  The patient was taken to the operating room and general anesthesia was induced.  The patient was placed in the dorsal lithotomy position, prepped and draped in the usual sterile fashion, and preoperative antibiotics were administered. A preoperative time-out was performed.   Cystourethroscopy was performed.  The patient's urethra was examined and was normal. The bladder was then systematically examined in its entirety. There was no evidence for any bladder tumors, stones, or other mucosal pathology.    Attention then turned to the right ureteral orifice and a ureteral catheter was used to intubate the ureteral orifice.  .  A 0.38 sensor guidewire was then advanced up the right ureter into the renal pelvis under fluoroscopic guidance.  The wire was then backloaded through the cystoscope and a ureteral stent was advance over the wire using Seldinger technique.  The stent was positioned appropriately under fluoroscopic and cystoscopic guidance.  The wire was then removed with an adequate stent curl noted in the renal pelvis as well as in the  bladder.  The bladder was then emptied and the procedure ended.  The patient appeared to tolerate the procedure well and without complications.  The patient was able to be awakened and transferred to the recovery unit in satisfactory condition.    Bertram Millard. Retta Diones, MD

## 2012-07-04 NOTE — Anesthesia Procedure Notes (Signed)
Procedure Name: LMA Insertion Date/Time: 07/04/2012 2:18 PM Performed by: Jessica Priest Pre-anesthesia Checklist: Patient identified, Emergency Drugs available, Suction available and Patient being monitored Patient Re-evaluated:Patient Re-evaluated prior to inductionOxygen Delivery Method: Circle System Utilized Preoxygenation: Pre-oxygenation with 100% oxygen Intubation Type: IV induction Ventilation: Mask ventilation without difficulty LMA: LMA inserted LMA Size: 4.0 Number of attempts: 1 Airway Equipment and Method: bite block Placement Confirmation: positive ETCO2 Tube secured with: Tape Dental Injury: Teeth and Oropharynx as per pre-operative assessment

## 2012-07-05 ENCOUNTER — Encounter (HOSPITAL_BASED_OUTPATIENT_CLINIC_OR_DEPARTMENT_OTHER): Payer: Self-pay | Admitting: Urology

## 2012-07-08 LAB — POCT HEMOGLOBIN-HEMACUE: Hemoglobin: 14.2 g/dL (ref 12.0–15.0)

## 2012-07-19 ENCOUNTER — Other Ambulatory Visit: Payer: Self-pay | Admitting: Urology

## 2012-07-22 ENCOUNTER — Encounter (HOSPITAL_BASED_OUTPATIENT_CLINIC_OR_DEPARTMENT_OTHER): Payer: Self-pay | Admitting: *Deleted

## 2012-07-22 NOTE — H&P (Signed)
  H&P  Chief Complaint: Kidney stone  History of Present Illness: Misty [name] is a 37 y.o. year old female who presents for ureteroscopic management of a right upper ureteral stone. She presented in January 2014 with an infected, obstructing stone and was stented. She presents for stone treatment.  Past Medical History  Diagnosis Date  . Kidney stones     Past Surgical History  Procedure Date  . Lithotripsy   . Kidney surgery   . Nausea 1/16  . Dysuria   . Urinary frequency   . Cystoscopy with stent placement 07/04/2012    Procedure: CYSTOSCOPY WITH STENT PLACEMENT;  Surgeon: Marcine Matar, MD;  Location: [facility];  Service: Urology;  Laterality: Right;  rt dbl j stent placement     Home Medications:  No prescriptions prior to admission    Allergies:  Allergies  Allergen Reactions  . Dilaudid (Hydromorphone Hcl) Shortness Of Breath    H/a, n/v  . Ceftriaxone Sodium Other (See Comments)    Hair loss  . Latex     No family history on file.  Social History:  reports that she has never smoked. She does not have any smokeless tobacco history on file. She reports that she drinks alcohol. She reports that she does not use illicit drugs.  ROS: A complete review of systems was performed.  All systems are negative except for pertinent findings as noted.  Physical Exam:  Vital signs in last 24 hours:   General:  Alert and oriented, No acute distress HEENT: Normocephalic, atraumatic Neck: No JVD or lymphadenopathy Cardiovascular: Regular rate and rhythm Lungs: Clear bilaterally Abdomen: Soft, nontender, nondistended, no abdominal masses Back: No CVA tenderness Extremities: No edema Neurologic: Grossly intact  Laboratory Data:  No results found for this or any previous visit (from the past 24 hour(s)). No results found for this or any previous visit (from the past 240 hour(s)). Creatinine: No results found for this basename: CREATININE:7 in  the last 168 hours  Radiologic Imaging: No results found.  Impression/Assessment:  Right ureteral stone with h/o pyonephrosis  Plan:  Cystoscopy, right ureteroscopy with laser and extraction of stone  [name] 07/22/2012, 1:37 PM  [name] MD

## 2012-07-23 ENCOUNTER — Encounter (HOSPITAL_BASED_OUTPATIENT_CLINIC_OR_DEPARTMENT_OTHER): Payer: Self-pay | Admitting: *Deleted

## 2012-07-23 NOTE — Progress Notes (Signed)
NPO AFTER MN. ARRIVES AT 1000. NEEDS HG.

## 2012-07-25 ENCOUNTER — Ambulatory Visit (HOSPITAL_BASED_OUTPATIENT_CLINIC_OR_DEPARTMENT_OTHER): Payer: 59 | Admitting: Anesthesiology

## 2012-07-25 ENCOUNTER — Encounter (HOSPITAL_BASED_OUTPATIENT_CLINIC_OR_DEPARTMENT_OTHER): Payer: Self-pay | Admitting: Anesthesiology

## 2012-07-25 ENCOUNTER — Ambulatory Visit (HOSPITAL_BASED_OUTPATIENT_CLINIC_OR_DEPARTMENT_OTHER)
Admission: RE | Admit: 2012-07-25 | Discharge: 2012-07-25 | Disposition: A | Discharge status: Home / Self Care | Payer: 59 | Source: Ambulatory Visit | Attending: Urology | Admitting: Urology

## 2012-07-25 ENCOUNTER — Encounter (HOSPITAL_BASED_OUTPATIENT_CLINIC_OR_DEPARTMENT_OTHER): Payer: Self-pay | Admitting: *Deleted

## 2012-07-25 ENCOUNTER — Encounter (HOSPITAL_BASED_OUTPATIENT_CLINIC_OR_DEPARTMENT_OTHER)
Admission: RE | Disposition: A | Discharge status: Home / Self Care | Payer: Self-pay | Source: Ambulatory Visit | Attending: Urology

## 2012-07-25 DIAGNOSIS — N201 Calculus of ureter: Secondary | ICD-10-CM | POA: Insufficient documentation

## 2012-07-25 DIAGNOSIS — Z87442 Personal history of urinary calculi: Secondary | ICD-10-CM

## 2012-07-25 DIAGNOSIS — N200 Calculus of kidney: Secondary | ICD-10-CM | POA: Insufficient documentation

## 2012-07-25 HISTORY — DX: Constipation, unspecified: K59.00

## 2012-07-25 HISTORY — DX: Frequency of micturition: R35.0

## 2012-07-25 HISTORY — DX: Hematuria, unspecified: R31.9

## 2012-07-25 HISTORY — PX: CYSTOSCOPY/RETROGRADE/URETEROSCOPY/STONE EXTRACTION WITH BASKET: SHX5317

## 2012-07-25 HISTORY — DX: Calculus of ureter: N20.1

## 2012-07-25 HISTORY — DX: Urgency of urination: R39.15

## 2012-07-25 HISTORY — DX: Dysuria: R30.0

## 2012-07-25 HISTORY — PX: HOLMIUM LASER APPLICATION: SHX5852

## 2012-07-25 HISTORY — DX: Personal history of urinary calculi: Z87.442

## 2012-07-25 HISTORY — DX: Nocturia: R35.1

## 2012-07-25 LAB — POCT PREGNANCY, URINE: Preg Test, Ur: NEGATIVE

## 2012-07-25 LAB — POCT I-STAT, CHEM 8
BUN: 14 mg/dL (ref 6–23)
Calcium, Ion: 1.25 mmol/L — ABNORMAL HIGH (ref 1.12–1.23)
Chloride: 107 mEq/L (ref 96–112)
Creatinine, Ser: 1.1 mg/dL (ref 0.50–1.10)
Glucose, Bld: 96 mg/dL (ref 70–99)
HCT: 47 % — ABNORMAL HIGH (ref 36.0–46.0)
Hemoglobin: 16 g/dL — ABNORMAL HIGH (ref 12.0–15.0)
Potassium: 4.7 mEq/L (ref 3.5–5.1)
Sodium: 139 mEq/L (ref 135–145)
TCO2: 26 mmol/L (ref 0–100)

## 2012-07-25 SURGERY — CYSTOSCOPY, WITH CALCULUS REMOVAL USING BASKET
Anesthesia: General | Site: Ureter | Laterality: Right | Wound class: Clean Contaminated

## 2012-07-25 MED ORDER — GENTAMICIN SULFATE 40 MG/ML IJ SOLN
440.0000 mg | Freq: Once | INTRAVENOUS | Status: AC
  Administered 2012-07-25: 440 mg via INTRAVENOUS
  Filled 2012-07-25 (×2): qty 11

## 2012-07-25 MED ORDER — ACETAMINOPHEN 650 MG RE SUPP
650.0000 mg | RECTAL | Status: DC | PRN
  Filled 2012-07-25: qty 1

## 2012-07-25 MED ORDER — LACTATED RINGERS IV SOLN
INTRAVENOUS | Status: DC
  Administered 2012-07-25: 100 mL/h via INTRAVENOUS
  Filled 2012-07-25: qty 1000

## 2012-07-25 MED ORDER — GENTAMICIN SULFATE 40 MG/ML IJ SOLN
160.0000 mg | INTRAVENOUS | Status: DC
  Filled 2012-07-25: qty 4

## 2012-07-25 MED ORDER — OXYCODONE HCL 5 MG PO TABS
5.0000 mg | ORAL_TABLET | ORAL | Status: DC | PRN
  Filled 2012-07-25: qty 2

## 2012-07-25 MED ORDER — ACETAMINOPHEN 325 MG PO TABS
650.0000 mg | ORAL_TABLET | ORAL | Status: DC | PRN
  Filled 2012-07-25: qty 2

## 2012-07-25 MED ORDER — FENTANYL CITRATE 0.05 MG/ML IJ SOLN
25.0000 ug | INTRAMUSCULAR | Status: DC | PRN
  Filled 2012-07-25: qty 1

## 2012-07-25 MED ORDER — BELLADONNA ALKALOIDS-OPIUM 16.2-60 MG RE SUPP
RECTAL | Status: DC | PRN
  Administered 2012-07-25: 1 via RECTAL

## 2012-07-25 MED ORDER — ACETAMINOPHEN 10 MG/ML IV SOLN
INTRAVENOUS | Status: DC | PRN
  Administered 2012-07-25: 1000 mg via INTRAVENOUS

## 2012-07-25 MED ORDER — SODIUM CHLORIDE 0.9 % IR SOLN
Status: DC | PRN
  Administered 2012-07-25: 6000 mL

## 2012-07-25 MED ORDER — PROPOFOL 10 MG/ML IV BOLUS
INTRAVENOUS | Status: DC | PRN
  Administered 2012-07-25: 200 mg via INTRAVENOUS

## 2012-07-25 MED ORDER — ONDANSETRON HCL 4 MG/2ML IJ SOLN
4.0000 mg | Freq: Four times a day (QID) | INTRAMUSCULAR | Status: DC | PRN
  Filled 2012-07-25: qty 2

## 2012-07-25 MED ORDER — DEXAMETHASONE SODIUM PHOSPHATE 4 MG/ML IJ SOLN
INTRAMUSCULAR | Status: DC | PRN
  Administered 2012-07-25: 10 mg via INTRAVENOUS

## 2012-07-25 MED ORDER — SODIUM CHLORIDE 0.9 % IV SOLN
250.0000 mL | INTRAVENOUS | Status: DC | PRN
  Filled 2012-07-25: qty 250

## 2012-07-25 MED ORDER — ONDANSETRON HCL 4 MG/2ML IJ SOLN
INTRAMUSCULAR | Status: DC | PRN
  Administered 2012-07-25: 4 mg via INTRAVENOUS

## 2012-07-25 MED ORDER — LIDOCAINE HCL (CARDIAC) 20 MG/ML IV SOLN
INTRAVENOUS | Status: DC | PRN
  Administered 2012-07-25: 80 mg via INTRAVENOUS

## 2012-07-25 MED ORDER — SODIUM CHLORIDE 0.9 % IJ SOLN
3.0000 mL | INTRAMUSCULAR | Status: DC | PRN
  Filled 2012-07-25: qty 3

## 2012-07-25 MED ORDER — FENTANYL CITRATE 0.05 MG/ML IJ SOLN
INTRAMUSCULAR | Status: DC | PRN
  Administered 2012-07-25 (×4): 50 ug via INTRAVENOUS

## 2012-07-25 MED ORDER — SODIUM CHLORIDE 0.9 % IJ SOLN
3.0000 mL | Freq: Two times a day (BID) | INTRAMUSCULAR | Status: DC
  Filled 2012-07-25: qty 3

## 2012-07-25 SURGICAL SUPPLY — 30 items
ADAPTER CATH URET PLST 4-6FR (CATHETERS) IMPLANT
ADPR CATH URET STRL DISP 4-6FR (CATHETERS)
BAG DRAIN URO-CYSTO SKYTR STRL (DRAIN) ×2 IMPLANT
BAG DRN UROCATH (DRAIN) ×1
BASKET STNLS GEMINI 4WIRE 3FR (BASKET) IMPLANT
BASKET ZERO TIP NITINOL 2.4FR (BASKET) ×1 IMPLANT
BSKT STON RTRVL GEM 120X11 3FR (BASKET)
BSKT STON RTRVL ZERO TP 2.4FR (BASKET) ×1
CANISTER SUCT LVC 12 LTR MEDI- (MISCELLANEOUS) ×2 IMPLANT
CATH INTERMIT  6FR 70CM (CATHETERS) IMPLANT
CLOTH BEACON ORANGE TIMEOUT ST (SAFETY) ×2 IMPLANT
DRAPE CAMERA CLOSED 9X96 (DRAPES) ×2 IMPLANT
GLOVE BIO SURGEON STRL SZ8 (GLOVE) ×1 IMPLANT
GLOVE BIOGEL PI IND STRL 6.5 (GLOVE) IMPLANT
GLOVE BIOGEL PI INDICATOR 6.5 (GLOVE) ×2
GLOVE SKINSENSE NS SZ6.5 (GLOVE) ×2
GLOVE SKINSENSE NS SZ7.5 (GLOVE) ×1
GLOVE SKINSENSE STRL SZ6.5 (GLOVE) IMPLANT
GLOVE SKINSENSE STRL SZ7.5 (GLOVE) IMPLANT
GOWN PREVENTION PLUS LG XLONG (DISPOSABLE) ×3 IMPLANT
GOWN STRL REIN XL XLG (GOWN DISPOSABLE) ×2 IMPLANT
GUIDEWIRE 0.038 PTFE COATED (WIRE) IMPLANT
GUIDEWIRE ANG ZIPWIRE 038X150 (WIRE) IMPLANT
GUIDEWIRE STR DUAL SENSOR (WIRE) ×1 IMPLANT
IV NS IRRIG 3000ML ARTHROMATIC (IV SOLUTION) ×2 IMPLANT
LASER FIBER DISP (UROLOGICAL SUPPLIES) ×1 IMPLANT
NS IRRIG 500ML POUR BTL (IV SOLUTION) IMPLANT
PACK CYSTOSCOPY (CUSTOM PROCEDURE TRAY) ×2 IMPLANT
SHEATH ACCESS URETERAL 38CM (SHEATH) ×1 IMPLANT
STENT URET 6FRX24 CONTOUR (STENTS) ×1 IMPLANT

## 2012-07-25 NOTE — Anesthesia Procedure Notes (Addendum)
Procedure Name: LMA Insertion Date/Time: 07/25/2012 11:25 AM Performed by: Maris Berger T Pre-anesthesia Checklist: Patient identified, Emergency Drugs available, Suction available and Patient being monitored Patient Re-evaluated:Patient Re-evaluated prior to inductionOxygen Delivery Method: Circle System Utilized Preoxygenation: Pre-oxygenation with 100% oxygen Intubation Type: IV induction Ventilation: Mask ventilation without difficulty LMA: LMA inserted LMA Size: 4.0 Number of attempts: 1 Placement Confirmation: positive ETCO2 Dental Injury: Teeth and Oropharynx as per pre-operative assessment  Comments: Pt wearing clear tooth tray on upper teeth.  Gauze roll between teeth

## 2012-07-25 NOTE — Transfer of Care (Signed)
Immediate Anesthesia Transfer of Care Note  Patient: Misty [name]  Procedure(s) Performed: Procedure(s) (LRB): CYSTOSCOPY/RETROGRADE/URETEROSCOPY/STONE EXTRACTION WITH BASKET (Right) HOLMIUM LASER APPLICATION (Right)  Patient Location: Patient transported to PACU with oxygen via face mask at 4 Liters / Min  Anesthesia Type: General  Level of Consciousness: awake and alert   Airway & Oxygen Therapy: Patient Spontanous Breathing and Patient connected to face mask oxygen  Post-op Assessment: Report given to PACU RN and Post -op Vital signs reviewed and stable  Post vital signs: Reviewed and stable  Dentition: Teeth and oropharynx remain in pre-op condition  Complications: No apparent anesthesia complications

## 2012-07-25 NOTE — Anesthesia Postprocedure Evaluation (Signed)
Anesthesia Post Note  Patient: Misty [name]  Procedure(s) Performed: Procedure(s) (LRB): CYSTOSCOPY/RETROGRADE/URETEROSCOPY/STONE EXTRACTION WITH BASKET (Right) HOLMIUM LASER APPLICATION (Right)  Anesthesia type: general  Patient location: PACU  Post pain: Pain level controlled  Post assessment: Patient's Cardiovascular Status Stable  Last Vitals:  Filed Vitals:   07/25/12 1330  BP: 112/81  Pulse: 82  Temp:   Resp: 21    Post vital signs: Reviewed and stable  Level of consciousness: sedated  Complications: No apparent anesthesia complications

## 2012-07-25 NOTE — Anesthesia Preprocedure Evaluation (Signed)
Anesthesia Evaluation  Patient identified by MRN, date of birth, ID band Patient awake    Reviewed: Allergy & Precautions, H&P , NPO status , Patient's Chart, lab work & pertinent test results, reviewed documented beta blocker date and time   Airway Mallampati: II TM Distance: >3 FB Neck ROM: full    Dental No notable dental hx.    Pulmonary neg pulmonary ROS,  breath sounds clear to auscultation  Pulmonary exam normal       Cardiovascular Exercise Tolerance: Good negative cardio ROS  Rhythm:regular Rate:Normal     Neuro/Psych negative neurological ROS  negative psych ROS   GI/Hepatic negative GI ROS, Neg liver ROS,   Endo/Other  negative endocrine ROS  Renal/GU negative Renal ROS  negative genitourinary   Musculoskeletal   Abdominal   Peds  Hematology negative hematology ROS (+)   Anesthesia Other Findings   Reproductive/Obstetrics negative OB ROS                           Anesthesia Physical Anesthesia Plan  ASA: I  Anesthesia Plan: General LMA   Post-op Pain Management:    Induction:   Airway Management Planned:   Additional Equipment:   Intra-op Plan:   Post-operative Plan:   Informed Consent: I have reviewed the patients History and Physical, chart, labs and discussed the procedure including the risks, benefits and alternatives for the proposed anesthesia with the patient or authorized representative who has indicated his/her understanding and acceptance.   Dental Advisory Given  Plan Discussed with: CRNA  Anesthesia Plan Comments:         Anesthesia Quick Evaluation

## 2012-07-25 NOTE — Op Note (Signed)
Preoperative diagnosis: Right upper ureteral and lower pole renal calculi  Postoperative diagnosis: Same   Procedure: Cystoscopy, right ureteral dilatation, right ureteroscopy, holmium laser of right proximal ureteral and lower pole renal calculi    Surgeon: Bertram Millard. Pinkney Venard, M.D.   Anesthesia: Gen.   Complications: None  Specimen(s): Stone fragments, to family  Drain(s): 24 cm x 6 French contour stent with tether  Indications: 37 year old female who presented to my office in January, 2014 with obstructing stones and pyonephrosis. She underwent urgent stenting. She has a right UPJ stone into lower pole calculi. She presents at this time for definitive management of these. She is aware of the procedure of ureteroscopy laser and extraction of calculi, as well as risks and complications. She desires to proceed.    Technique and findings: The patient was properly identified and marked in the holding area, and received preoperative IV antibiotics. She was taken the operating room where general anesthetic was administered with the LMA. She was placed in the dorsolithotomy position. Genitalia and perineum were prepped and draped, time out was then performed.  I then passed a 29 [place] scope into her bladder. The bladder was inspected and found to be normal. The stent was grasped and brought out through the urethral meatus. I then passed a guidewire through the stent, and under fluoroscopic guidance this was negotiated into the right renal pelvis where a good curl was seen. The stent was then removed over top of the guidewire. I then used a 6 [place] rigid ureteroscope to navigate the right ureter, and the stone was seen at the UPJ. The stone was fairly large, and it was fragmented into several smaller fragments using a 200  fiber and the holmium laser. These fragments were easily extracted using the stone basket. Fluoroscopically, there were 2 calcifications in the right lower pole area. These  could not be accessed using the rigid scope. Over top of the previously placed guidewire, I then placed the ureteral access sheath for the digital scope. I then advanced the digital ureteroscope up into the right kidney, and the upper pole calyces were identified and found to be free of stones. The lower pole stones were then grasped with the basket, and brought into a n interpolar posterior calyx, where they were fragmented with the holmium laser into smaller fragments. The large majority of these fragments, the sizable ones anyway, were extracted using the stone basket. There were easily extracted through the ureteroscope sheath. Small fragments were remaining, and I felt that these was passed quite easily through the dilated ureter. After the majority of the fragments were removed, I then replaced the guidewire through the access sheath, and remove the sheath. Using the cystoscope, I passed a 24 cm x 6 French contour double-J stent, leaving the catheter attached. Once adequate position was obtained using fluoroscopic and cystoscopic guidance, I removed the guidewire with good curl seen proximally and distally. The bladder was drained and the scope removed. The string was taped to the patient's perineum. She tolerated the procedure well. Stone fragments were given to the family. She will be discharged home on 5 days of trimethoprim 100 mg nightly, as well as uribel. She will followup in the office as planned. She will remove the stent in approximately 2 days.

## 2012-07-26 ENCOUNTER — Encounter (HOSPITAL_BASED_OUTPATIENT_CLINIC_OR_DEPARTMENT_OTHER): Payer: Self-pay | Admitting: Urology

## 2013-01-03 ENCOUNTER — Encounter (HOSPITAL_COMMUNITY): Payer: Self-pay | Admitting: Pharmacist

## 2013-01-04 ENCOUNTER — Other Ambulatory Visit: Payer: Self-pay | Admitting: Obstetrics and Gynecology

## 2013-01-16 NOTE — H&P (Signed)
NAMEMarland Kitchen  Misty, DAU NO.:  [id number]  MEDICAL RECORD NO.:  [id number]  LOCATION:  [facility]                         FACILITY:  [facility]  PHYSICIAN:  Malachi Pro. Ambrose Mantle, M.D. DATE OF BIRTH:  Nov 29, 1975  DATE OF ADMISSION:  01/17/2013 DATE OF DISCHARGE:                             HISTORY & PHYSICAL   HISTORY OF PRESENT ILLNESS:  This is a 37 year old black female, para 5- 0-2-5, who is admitted for tubal cauterization procedure for sterilization.  The patient uses Nexplanon for contraception.  She has requested sterilization and we ready to proceed.  The patient works in [employer].  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  She is divorced.  She claims to have had a 17-pound weight gain in the last year and she attributes that to the Nexplanon.  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  PAST MEDICAL HISTORY:  No known drug allergies.  She has had depression.  PAST SURGICAL HISTORY:  She had a stent placed in for kidney stones. She has also had a history of multiple kidney stones.  FAMILY HISTORY:  Maternal grandmother with cancer of the breast.  The patient does not smoke, does not drink, does not take drugs.  PHYSICAL EXAMINATION:  GENERAL:  Height 5 feet 9 inches, 210 pounds. Blood pressure 120/70. HEART:  Normal size and sounds.  No murmurs. LUNGS:  Clear to auscultation. ABDOMEN:  Soft, somewhat obese, nondistended.  No hernias are palpable. PELVIC:  Vulva and vagina are clean.  The cervix is clean.  Uterus is mid plane, normal size.  No abnormalities, although the uterus is relaxed.  The adnexa are free of masses.  ADMITTING IMPRESSION:  Voluntary sterilization.  PLAN:  The patient is admitted for laparoscopic tubal cauterization. She understands the risks and is ready to proceed.     Malachi Pro. Ambrose Mantle,  M.D.     TFH/MEDQ  D:  01/16/2013  T:  01/16/2013  Job:  [id number]

## 2013-01-17 ENCOUNTER — Encounter (HOSPITAL_COMMUNITY): Payer: Self-pay | Admitting: Anesthesiology

## 2013-01-17 ENCOUNTER — Ambulatory Visit (HOSPITAL_COMMUNITY): Payer: 59 | Admitting: Anesthesiology

## 2013-01-17 ENCOUNTER — Encounter (HOSPITAL_COMMUNITY)
Admission: RE | Disposition: A | Discharge status: Home / Self Care | Payer: Self-pay | Source: Ambulatory Visit | Attending: Obstetrics and Gynecology

## 2013-01-17 ENCOUNTER — Ambulatory Visit (HOSPITAL_COMMUNITY)
Admission: RE | Admit: 2013-01-17 | Discharge: 2013-01-17 | Disposition: A | Discharge status: Home / Self Care | Payer: 59 | Source: Ambulatory Visit | Attending: Obstetrics and Gynecology | Admitting: Obstetrics and Gynecology

## 2013-01-17 DIAGNOSIS — N736 Female pelvic peritoneal adhesions (postinfective): Secondary | ICD-10-CM

## 2013-01-17 DIAGNOSIS — Z302 Encounter for sterilization: Secondary | ICD-10-CM | POA: Insufficient documentation

## 2013-01-17 HISTORY — PX: LAPAROSCOPIC TUBAL LIGATION: SHX1937

## 2013-01-17 LAB — URINALYSIS, ROUTINE W REFLEX MICROSCOPIC
Bilirubin Urine: NEGATIVE
Glucose, UA: NEGATIVE mg/dL
Ketones, ur: NEGATIVE mg/dL
Leukocytes, UA: NEGATIVE
Nitrite: NEGATIVE
Protein, ur: NEGATIVE mg/dL
Specific Gravity, Urine: >1.03 — ABNORMAL HIGH (ref 1.005–1.030)
Urobilinogen, UA: 0.2 mg/dL (ref 0.0–1.0)
pH: 6 (ref 5.0–8.0)

## 2013-01-17 LAB — COMPREHENSIVE METABOLIC PANEL
ALT: 15 U/L (ref 0–35)
AST: 14 U/L (ref 0–37)
Albumin: 3.4 g/dL — ABNORMAL LOW (ref 3.5–5.2)
Alkaline Phosphatase: 57 U/L (ref 39–117)
BUN: 12 mg/dL (ref 6–23)
CO2: 25 mEq/L (ref 19–32)
Calcium: 9.4 mg/dL (ref 8.4–10.5)
Chloride: 105 mEq/L (ref 96–112)
Creatinine, Ser: 0.86 mg/dL (ref 0.50–1.10)
GFR calc Af Amer: >90 mL/min (ref >90)
GFR calc non Af Amer: 84 mL/min — ABNORMAL LOW (ref >90)
Glucose, Bld: 103 mg/dL — ABNORMAL HIGH (ref 70–99)
Potassium: 3.6 mEq/L (ref 3.5–5.1)
Sodium: 139 mEq/L (ref 135–145)
Total Bilirubin: 0.3 mg/dL (ref 0.3–1.2)
Total Protein: 6.8 g/dL (ref 6.0–8.3)

## 2013-01-17 LAB — URINE MICROSCOPIC-ADD ON

## 2013-01-17 LAB — CBC WITH DIFFERENTIAL/PLATELET
Basophils Absolute: 0 10*3/uL (ref 0.0–0.1)
Basophils Relative: 0 % (ref 0–1)
Eosinophils Absolute: 0.1 10*3/uL (ref 0.0–0.7)
Eosinophils Relative: 1 % (ref 0–5)
HCT: 42.1 % (ref 36.0–46.0)
Hemoglobin: 13.7 g/dL (ref 12.0–15.0)
Lymphocytes Relative: 47 % — ABNORMAL HIGH (ref 12–46)
Lymphs Abs: 2.6 10*3/uL (ref 0.7–4.0)
MCH: 27.3 pg (ref 26.0–34.0)
MCHC: 32.5 g/dL (ref 30.0–36.0)
MCV: 83.9 fL (ref 78.0–100.0)
Monocytes Absolute: 0.5 10*3/uL (ref 0.1–1.0)
Monocytes Relative: 10 % (ref 3–12)
Neutro Abs: 2.3 10*3/uL (ref 1.7–7.7)
Neutrophils Relative %: 42 % — ABNORMAL LOW (ref 43–77)
Platelets: 227 10*3/uL (ref 150–400)
RBC: 5.02 MIL/uL (ref 3.87–5.11)
RDW: 14.1 % (ref 11.5–15.5)
WBC: 5.5 10*3/uL (ref 4.0–10.5)

## 2013-01-17 LAB — PREGNANCY, URINE: Preg Test, Ur: NEGATIVE

## 2013-01-17 SURGERY — LIGATION, FALLOPIAN TUBE, LAPAROSCOPIC
Anesthesia: General | Site: Abdomen | Laterality: Bilateral | Wound class: Clean Contaminated

## 2013-01-17 MED ORDER — PROPOFOL 10 MG/ML IV EMUL
INTRAVENOUS | Status: AC
  Filled 2013-01-17: qty 20

## 2013-01-17 MED ORDER — MIDAZOLAM HCL 2 MG/2ML IJ SOLN: 0.5000 mg | Freq: Once | INTRAMUSCULAR | Status: DC | PRN

## 2013-01-17 MED ORDER — MEPERIDINE HCL 25 MG/ML IJ SOLN
INTRAMUSCULAR | Status: AC
  Filled 2013-01-17: qty 1

## 2013-01-17 MED ORDER — FENTANYL CITRATE 0.05 MG/ML IJ SOLN
INTRAMUSCULAR | Status: AC
  Filled 2013-01-17: qty 2

## 2013-01-17 MED ORDER — LACTATED RINGERS IV SOLN
INTRAVENOUS | Status: DC
  Administered 2013-01-17: 1000 mL via INTRAVENOUS

## 2013-01-17 MED ORDER — DEXAMETHASONE SODIUM PHOSPHATE 10 MG/ML IJ SOLN
INTRAMUSCULAR | Status: AC
  Filled 2013-01-17: qty 1

## 2013-01-17 MED ORDER — ROCURONIUM BROMIDE 100 MG/10ML IV SOLN
INTRAVENOUS | Status: DC | PRN
  Administered 2013-01-17: 30 mg via INTRAVENOUS

## 2013-01-17 MED ORDER — ACETAMINOPHEN 160 MG/5ML PO SOLN
ORAL | Status: AC
  Filled 2013-01-17: qty 40.6

## 2013-01-17 MED ORDER — MIDAZOLAM HCL 2 MG/2ML IJ SOLN
INTRAMUSCULAR | Status: AC
  Filled 2013-01-17: qty 2

## 2013-01-17 MED ORDER — ONDANSETRON HCL 4 MG/2ML IJ SOLN
INTRAMUSCULAR | Status: AC
  Filled 2013-01-17: qty 2

## 2013-01-17 MED ORDER — LACTATED RINGERS IV SOLN
INTRAVENOUS | Status: DC | PRN
  Administered 2013-01-17: 08:00:00 via INTRAVENOUS

## 2013-01-17 MED ORDER — FENTANYL CITRATE 0.05 MG/ML IJ SOLN
INTRAMUSCULAR | Status: DC | PRN
  Administered 2013-01-17: 25 ug via INTRAVENOUS
  Administered 2013-01-17: 50 ug via INTRAVENOUS
  Administered 2013-01-17: 25 ug via INTRAVENOUS
  Administered 2013-01-17 (×2): 50 ug via INTRAVENOUS

## 2013-01-17 MED ORDER — MEPERIDINE HCL 25 MG/ML IJ SOLN
6.2500 mg | INTRAMUSCULAR | Status: DC | PRN
  Administered 2013-01-17: 12.5 mg via INTRAVENOUS

## 2013-01-17 MED ORDER — GLYCOPYRROLATE 0.2 MG/ML IJ SOLN
INTRAMUSCULAR | Status: DC | PRN
  Administered 2013-01-17: 0.4 mg via INTRAVENOUS

## 2013-01-17 MED ORDER — ONDANSETRON HCL 4 MG/2ML IJ SOLN
INTRAMUSCULAR | Status: DC | PRN
  Administered 2013-01-17: 4 mg via INTRAVENOUS

## 2013-01-17 MED ORDER — ROCURONIUM BROMIDE 50 MG/5ML IV SOLN
INTRAVENOUS | Status: AC
  Filled 2013-01-17: qty 1

## 2013-01-17 MED ORDER — IBUPROFEN 600 MG PO TABS: 600.0000 mg | ORAL_TABLET | Freq: Four times a day (QID) | ORAL | Status: DC | PRN

## 2013-01-17 MED ORDER — LACTATED RINGERS IV SOLN: INTRAVENOUS | Status: DC | PRN

## 2013-01-17 MED ORDER — FENTANYL CITRATE 0.05 MG/ML IJ SOLN
25.0000 ug | INTRAMUSCULAR | Status: DC | PRN
  Administered 2013-01-17 (×2): 25 ug via INTRAVENOUS
  Administered 2013-01-17 (×2): 50 ug via INTRAVENOUS

## 2013-01-17 MED ORDER — KETOROLAC TROMETHAMINE 30 MG/ML IJ SOLN
INTRAMUSCULAR | Status: DC | PRN
  Administered 2013-01-17: 30 mg via INTRAVENOUS

## 2013-01-17 MED ORDER — ACETAMINOPHEN 160 MG/5ML PO SOLN
1000.0000 mg | Freq: Four times a day (QID) | ORAL | Status: AC | PRN
  Administered 2013-01-17: 1000 mg via ORAL

## 2013-01-17 MED ORDER — PROMETHAZINE HCL 25 MG/ML IJ SOLN: 6.2500 mg | INTRAMUSCULAR | Status: DC | PRN

## 2013-01-17 MED ORDER — OXYCODONE-ACETAMINOPHEN 5-325 MG PO TABS: 1.0000 | ORAL_TABLET | ORAL | Status: DC | PRN

## 2013-01-17 MED ORDER — KETOROLAC TROMETHAMINE 30 MG/ML IJ SOLN: 15.0000 mg | Freq: Once | INTRAMUSCULAR | Status: DC | PRN

## 2013-01-17 MED ORDER — MIDAZOLAM HCL 5 MG/5ML IJ SOLN
INTRAMUSCULAR | Status: DC | PRN
  Administered 2013-01-17: 2 mg via INTRAVENOUS

## 2013-01-17 MED ORDER — PROPOFOL 10 MG/ML IV BOLUS
INTRAVENOUS | Status: DC | PRN
  Administered 2013-01-17: 200 mg via INTRAVENOUS

## 2013-01-17 MED ORDER — DEXAMETHASONE SODIUM PHOSPHATE 10 MG/ML IJ SOLN
INTRAMUSCULAR | Status: DC | PRN
  Administered 2013-01-17: 5 mg via INTRAVENOUS

## 2013-01-17 MED ORDER — NEOSTIGMINE METHYLSULFATE 1 MG/ML IJ SOLN
INTRAMUSCULAR | Status: DC | PRN
  Administered 2013-01-17: 3 mg via INTRAVENOUS

## 2013-01-17 MED ORDER — LIDOCAINE HCL (CARDIAC) 20 MG/ML IV SOLN
INTRAVENOUS | Status: DC | PRN
  Administered 2013-01-17: 30 mg via INTRAVENOUS

## 2013-01-17 MED ORDER — LIDOCAINE HCL (CARDIAC) 20 MG/ML IV SOLN
INTRAVENOUS | Status: AC
  Filled 2013-01-17: qty 5

## 2013-01-17 SURGICAL SUPPLY — 25 items
BLADE SURG 15 STRL LF C SS BP (BLADE) ×1 IMPLANT
BLADE SURG 15 STRL SS (BLADE) ×2
CATH FOLEY LATEX FREE 14FR (CATHETERS) ×2
CATH FOLEY LF 14FR (CATHETERS) IMPLANT
CATH ROBINSON RED A/P 16FR (CATHETERS) ×2 IMPLANT
CLOTH BEACON ORANGE TIMEOUT ST (SAFETY) ×2 IMPLANT
DRSG COVADERM PLUS 2X2 (GAUZE/BANDAGES/DRESSINGS) ×1 IMPLANT
GAUZE SPONGE 4X4 12PLY STRL LF (GAUZE/BANDAGES/DRESSINGS) ×1 IMPLANT
GLOVE BIO SURGEON STRL SZ7.5 (GLOVE) ×2 IMPLANT
GOWN PREVENTION PLUS XLARGE (GOWN DISPOSABLE) ×2 IMPLANT
PACK LAPAROSCOPY BASIN (CUSTOM PROCEDURE TRAY) ×2 IMPLANT
STRIP CLOSURE SKIN 1/4X3 (GAUZE/BANDAGES/DRESSINGS) ×2 IMPLANT
SUT CHROMIC 2 0 CT 1 (SUTURE) ×1 IMPLANT
SUT CHROMIC 2 0 SH (SUTURE) ×1 IMPLANT
SUT PDS AB 0 CT 36 (SUTURE) ×1 IMPLANT
SUT PDS AB 1 CT1 36 (SUTURE) ×1 IMPLANT
SUT PLAIN 3 0 FS2 (SUTURE) ×2 IMPLANT
SUT VIC AB 3-0 CTX 36 (SUTURE) ×1 IMPLANT
SUT VICRYL 0 UR6 27IN ABS (SUTURE) IMPLANT
TOWEL OR 17X24 6PK STRL BLUE (TOWEL DISPOSABLE) ×4 IMPLANT
TROCAR BALLN 12MMX100 BLUNT (TROCAR) ×1 IMPLANT
TROCAR OPTI TIP 5M 100M (ENDOMECHANICALS) ×2 IMPLANT
TROCAR XCEL DIL TIP R 11M (ENDOMECHANICALS) ×2 IMPLANT
TROCAR XCEL NON-BLD 5MMX100MML (ENDOMECHANICALS) ×1 IMPLANT
WATER STERILE IRR 1000ML POUR (IV SOLUTION) ×2 IMPLANT

## 2013-01-17 NOTE — Progress Notes (Signed)
Patient ID: Misty [name], female   DOB: 1975/08/15, 37 y.o.   MRN: [id number] I examined this lady and she reports no current health problems.

## 2013-01-17 NOTE — Op Note (Signed)
NAMEMarland Kitchen  Misty, [name] NO.:  [id number]  MEDICAL RECORD NO.:  [id number]  LOCATION:  [facility]                          FACILITY:  [facility]  PHYSICIAN:  Malachi Pro. Ambrose Mantle, M.D. DATE OF BIRTH:  April 16, 1976  DATE OF PROCEDURE:  01/17/2013 DATE OF DISCHARGE:                              OPERATIVE REPORT   PREOPERATIVE DIAGNOSIS:  Voluntary sterilization.  POSTOPERATIVE DIAGNOSIS:  Voluntary sterilization with tubo-ovarian adhesions bilaterally.  OPERATION:  Laparoscopic tubal cauterization.  OPERATOR:  Malachi Pro. Ambrose Mantle, MD.  ANESTHESIA:  General anesthesia.  DESCRIPTION OF PROCEDURE:  The patient was brought to the operating room and placed under satisfactory general anesthesia.  She was placed in the [place] stirrups.  The abdomen, vulva, vagina and urethra were prepped with Betadine solution.  The bladder was emptied with a catheter that was latex free.  The cervix was visualized after the exam revealed the uterus to be anterior and normal size.  A tenaculum was placed on the anterior cervical lip, and a Hulka cannula was placed into the uterus and attached to the anterior cervical lip.  There seemed to be more bleeding than usual placing the cannula, so I decided I would inspect the cervix after the tenaculum was removed postoperatively.  The abdomen was then draped as a sterile field.  A time-out was done, and the inferior portion of the umbilicus was visualized, incised with a knife, and the incision was extended little bit on both sides.  The fascia was identified, drawn up and cut.  The peritoneal cavity was entered.  A Hasson cannula was placed into the peritoneal cavity, and a #1 PDS was placed around in a pursestring fashion before the Hasson was placed. When this Hasson was placed, it was tied down and inflating the bulb the surgical assistant broke off the syringe bleeding the tip of the syringe in the Hasson cannula.  So this was removed.  Another suture of PDS  was placed.  A re-usable Hasson was placed into the abdominal cavity, tied down and with  #1 PDS suture was a little bit bigger than the opening in the metal flanges on both sides of the Hasson that hold the suture. So the suture broke off but it did maintain enough of the pneumoperitoneum that we could proceed with the procedure.  A smaller incision was made suprapubically.  A smaller trocar was inserted under direct vision, and then both tubes and ovaries were inspected.  There were flimsy adhesions around both tubes and ovaries.  The left side was involved more than the right making the fimbriated end of the left tube impossible to see.  The ovaries seemed to be normal even though it was involved with adhesions, but the proximal portion of the left tube was visible and able to be cauterized.  The right tube was affected less.  I identified the right tube and ovary except for the small adhesions that appeared normal.  I cauterized the right tube in 3 or 4 consecutive locations until all of the vapor had been removed.  I then did the same thing on the left side, inspected the upper abdomen, found no problems.  The cul-de-sacs were free of disease.  The uterus appeared normal size.  Both tubes and ovaries were normal except for the cauterization and the adhesions.  The lower trocar was removed.  There was no bleeding.  The pneumoperitoneum was released.  Since there was not enough suture left to tie down, I had to re-suture the fascial incision in the umbilicus with the PDS tied it down,  closed the subcu tissue with 3-0 Vicryl and the skin with 3-0 plain catgut.  The patient seemed to tolerate the procedure well.  Blood loss was no more than 2 or 3 mL from that part of the procedure.  I then looked at the cervix after the Hulka cannula had been removed and there was a steady stream of blood coming from the anterior cervix.  I therefore sutured this with 2-0 chromic with complete  hemostasis, but the blood loss here was at least 75-100 mL.  At this point, the procedure was terminated.  The patient was returned to recovery in satisfactory condition.     Malachi Pro. Ambrose Mantle, M.D.     TFH/MEDQ  D:  01/17/2013  T:  01/17/2013  Job:  [id number]

## 2013-01-17 NOTE — Anesthesia Preprocedure Evaluation (Signed)
Anesthesia Evaluation  Patient identified by MRN, date of birth, ID band Patient awake    Reviewed: Allergy & Precautions, H&P , Patient's Chart, lab work & pertinent test results, reviewed documented beta blocker date and time   History of Anesthesia Complications Negative for: history of anesthetic complications  Airway Mallampati: II TM Distance: >3 FB Neck ROM: full    Dental no notable dental hx.    Pulmonary neg pulmonary ROS,  breath sounds clear to auscultation  Pulmonary exam normal       Cardiovascular Exercise Tolerance: Good negative cardio ROS  Rhythm:regular Rate:Normal     Neuro/Psych negative neurological ROS  negative psych ROS   GI/Hepatic negative GI ROS, Neg liver ROS,   Endo/Other  negative endocrine ROS  Renal/GU negative Renal ROS     Musculoskeletal   Abdominal   Peds  Hematology negative hematology ROS (+)   Anesthesia Other Findings Right ureteral stone     History of kidney stones        Frequency of urination     Urgency of urination        Nocturia     Dysuria        Hematuria     Constipation    Reproductive/Obstetrics negative OB ROS                           Anesthesia Physical Anesthesia Plan  ASA: II  Anesthesia Plan: General ETT   Post-op Pain Management:    Induction:   Airway Management Planned:   Additional Equipment:   Intra-op Plan:   Post-operative Plan:   Informed Consent: I have reviewed the patients History and Physical, chart, labs and discussed the procedure including the risks, benefits and alternatives for the proposed anesthesia with the patient or authorized representative who has indicated his/her understanding and acceptance.   Dental Advisory Given  Plan Discussed with: CRNA and Surgeon  Anesthesia Plan Comments:         Anesthesia Quick Evaluation

## 2013-01-17 NOTE — Transfer of Care (Signed)
Immediate Anesthesia Transfer of Care Note  Patient: Misty [name]  Procedure(s) Performed: Procedure(s) with comments: LAPAROSCOPIC TUBAL LIGATION (Bilateral) - 1hr OR time   Patient Location: PACU  Anesthesia Type:General  Level of Consciousness: sedated  Airway & Oxygen Therapy: Patient Spontanous Breathing and Patient connected to nasal cannula oxygen  Post-op Assessment: Report given to PACU RN and Post -op Vital signs reviewed and stable  Post vital signs: Reviewed and stable  Complications: No apparent anesthesia complications

## 2013-01-17 NOTE — Anesthesia Postprocedure Evaluation (Signed)
  Anesthesia Post-op Note  Patient: Misty [name]  Procedure(s) Performed: Procedure(s) with comments: LAPAROSCOPIC TUBAL LIGATION (Bilateral) - 1hr OR time  Patient is awake and responsive. Pain and nausea are reasonably well controlled. Vital signs are stable and clinically acceptable. Oxygen saturation is clinically acceptable. There are no apparent anesthetic complications at this time. Patient is ready for discharge.

## 2013-01-19 ENCOUNTER — Inpatient Hospital Stay (HOSPITAL_COMMUNITY): Payer: 59

## 2013-01-19 ENCOUNTER — Ambulatory Visit (HOSPITAL_COMMUNITY)
Admission: RE | Admit: 2013-01-19 | Discharge: 2013-01-19 | Disposition: A | Discharge status: Home / Self Care | Payer: 59 | Source: Ambulatory Visit

## 2013-01-19 ENCOUNTER — Inpatient Hospital Stay (HOSPITAL_COMMUNITY)
Admission: AD | Admit: 2013-01-19 | Discharge: 2013-01-19 | Disposition: A | Discharge status: Home / Self Care | Payer: 59 | Source: Ambulatory Visit | Attending: Obstetrics and Gynecology | Admitting: Obstetrics and Gynecology

## 2013-01-19 ENCOUNTER — Encounter (HOSPITAL_COMMUNITY): Payer: Self-pay | Admitting: Family

## 2013-01-19 DIAGNOSIS — Z9851 Tubal ligation status: Secondary | ICD-10-CM

## 2013-01-19 DIAGNOSIS — G8918 Other acute postprocedural pain: Secondary | ICD-10-CM | POA: Insufficient documentation

## 2013-01-19 DIAGNOSIS — K5900 Constipation, unspecified: Secondary | ICD-10-CM | POA: Insufficient documentation

## 2013-01-19 LAB — URINALYSIS, ROUTINE W REFLEX MICROSCOPIC
Bilirubin Urine: NEGATIVE
Glucose, UA: NEGATIVE mg/dL
Ketones, ur: NEGATIVE mg/dL
Leukocytes, UA: NEGATIVE
Nitrite: NEGATIVE
Protein, ur: NEGATIVE mg/dL
Specific Gravity, Urine: 1.02 (ref 1.005–1.030)
Urobilinogen, UA: 0.2 mg/dL (ref 0.0–1.0)
pH: 7 (ref 5.0–8.0)

## 2013-01-19 LAB — URINE MICROSCOPIC-ADD ON

## 2013-01-19 MED ORDER — IOHEXOL 300 MG/ML  SOLN
50.0000 mL | INTRAMUSCULAR | Status: AC
  Administered 2013-01-19: 50 mL via ORAL

## 2013-01-19 MED ORDER — KETOROLAC TROMETHAMINE 60 MG/2ML IM SOLN
60.0000 mg | Freq: Once | INTRAMUSCULAR | Status: AC
  Administered 2013-01-19: 60 mg via INTRAMUSCULAR
  Filled 2013-01-19: qty 2

## 2013-01-19 MED ORDER — OXYCODONE-ACETAMINOPHEN 5-325 MG PO TABS: 2.0000 | ORAL_TABLET | Freq: Once | ORAL | Status: DC

## 2013-01-19 MED ORDER — OXYCODONE-ACETAMINOPHEN 5-325 MG PO TABS
1.0000 | ORAL_TABLET | Freq: Once | ORAL | Status: AC
  Administered 2013-01-19: 1 via ORAL
  Filled 2013-01-19: qty 1

## 2013-01-19 MED ORDER — POLYETHYLENE GLYCOL 3350 17 G PO PACK: 17.0000 g | PACK | Freq: Every day | ORAL | Status: DC

## 2013-01-19 MED ORDER — ONDANSETRON 8 MG PO TBDP
8.0000 mg | ORAL_TABLET | Freq: Once | ORAL | Status: AC
  Administered 2013-01-19: 8 mg via ORAL
  Filled 2013-01-19: qty 1

## 2013-01-19 MED ORDER — IOHEXOL 300 MG/ML  SOLN
100.0000 mL | Freq: Once | INTRAMUSCULAR | Status: AC | PRN
  Administered 2013-01-19: 100 mL via INTRAVENOUS

## 2013-01-19 MED ORDER — FENTANYL CITRATE 0.05 MG/ML IJ SOLN: 50.0000 ug | Freq: Once | INTRAMUSCULAR | Status: DC

## 2013-01-19 NOTE — MAU Note (Signed)
Patient is very tender to palpation around the umbilical incision and the left lower abdomen. No discharge noted from either incision. No redness.

## 2013-01-19 NOTE — MAU Note (Signed)
Patient states she had a BTL done by Dr. Ambrose Mantle on 8-1. States she had had continual pain since the procedure but is getting worse. Having left lower abdominal pain, low back pain and chest pain. Had a little spotting, none now.

## 2013-01-19 NOTE — MAU Note (Addendum)
Patient presents to MAU s/p TL on Friday; reports lower left abdominal cramping 10/10 pain; spotting has subsided; last dose Percocet at 2000 last night. Reports last BM on Thursday before surgery.  Patient reports she can feel gas moving when she is up and about, but is not passing gas.

## 2013-01-19 NOTE — MAU Note (Signed)
Patient reports she is passing gas now.

## 2013-01-19 NOTE — MAU Provider Note (Signed)
History     CSN: [id number]  Arrival date and time: 01/19/13 1191   First Provider Initiated Contact with Patient 01/19/13 [phone]      Chief Complaint  Patient presents with  . Abdominal Pain  . Back Pain   HPI This is a 37 y.o. female who is 2 days post-op from an elective tubal ligation by Dr Ambrose Mantle.  Patient has had severe pain since surgery, has never had relief, "except when I take the Percocet which makes me sleep".  However, has taken very little Percocet due to fear of constipation. States takes ibuprofen every 6 hrs, but tells nurse has not taken it since yesterday.  Pain is mostly on left.   Has some nausea, but no vomiting. No BM in 2 days. No bleeding or fever.   Patient states she had a BTL done by Dr. Ambrose Mantle on 8-1. States she had had continual pain since the procedure but is getting worse. Having left lower abdominal pain, low back pain and chest pain. Had a little spotting, none now.        OB History   Grav Para Term Preterm Abortions TAB SAB Ect Mult Living   7    2 1 1   5       Past Medical History  Diagnosis Date  . Right ureteral stone   . History of kidney stones   . Frequency of urination   . Urgency of urination   . Nocturia   . Dysuria   . Hematuria   . Constipation     Past Surgical History  Procedure Laterality Date  . Extracorporeal shock wave lithotripsy  08-02-2009    RIGHT  . Cystolitholapaxy of large bladder calculi/   right ureteral stent exchange  04-15-2010  . Cysto w/ right ureteroscopy  2006  . Cysto/ right ureteral stent placement  07-29-2009    LARGE RIGHT RENAL CALCULUS  . Cystoscopy with stent placement  07/04/2012    Procedure: CYSTOSCOPY WITH STENT PLACEMENT;  Surgeon: Marcine Matar, MD;  Location: [facility];  Service: Urology;  Laterality: Right;  rt dbl j stent placement   . Cystoscopy/retrograde/ureteroscopy/stone extraction with basket  07/25/2012    Procedure: CYSTOSCOPY/RETROGRADE/URETEROSCOPY/STONE  EXTRACTION WITH BASKET;  Surgeon: Marcine Matar, MD;  Location: [facility];  Service: Urology;  Laterality: Right;  . Holmium laser application  07/25/2012    Procedure: HOLMIUM LASER APPLICATION;  Surgeon: Marcine Matar, MD;  Location: [facility];  Service: Urology;  Laterality: Right;    No family history on file.  History  Substance Use Topics  . Smoking status: Never Smoker   . Smokeless tobacco: Never Used  . Alcohol Use: Yes     Comment: occ    Allergies:  Allergies  Allergen Reactions  . Dilaudid (Hydromorphone Hcl) Shortness Of Breath and Nausea And Vomiting  . Ceftriaxone Sodium Other (See Comments)    Hair loss  . Latex Rash    Prescriptions prior to admission  Medication Sig Dispense Refill  . ALPRAZolam (XANAX) 1 MG tablet Take 1 mg by mouth daily as needed for anxiety.       Marland Kitchen buPROPion (WELLBUTRIN XL) 300 MG 24 hr tablet Take 300 mg by mouth daily.      Marland Kitchen etonogestrel (NEXPLANON) 68 MG IMPL implant Inject 1 each into the skin once.      Marland Kitchen FLUoxetine (PROZAC) 20 MG capsule Take 20 mg by mouth daily.      Marland Kitchen ibuprofen (  ADVIL,MOTRIN) 600 MG tablet Take 1 tablet (600 mg total) by mouth every 6 (six) hours as needed for pain.  30 tablet  0  . oxyCODONE-acetaminophen (ROXICET) 5-325 MG per tablet Take 1 tablet by mouth every 4 (four) hours as needed for pain.  30 tablet  0    Review of Systems  Constitutional: Positive for malaise/fatigue. Negative for fever and chills.  Gastrointestinal: Positive for nausea, abdominal pain and constipation. Negative for vomiting and diarrhea.  Genitourinary: Negative for dysuria.  Neurological: Positive for weakness. Negative for dizziness.   Physical Exam   Blood pressure 111/67, pulse 66, temperature 98.7 F (37.1 C), temperature source Oral, resp. rate 16, height 5' 7.5" (1.715 m), weight 90.084 kg (198 lb 9.6 oz), last menstrual period 01/05/2013, SpO2 97.00%.  Physical Exam   Constitutional: She is oriented to person, place, and time. She appears well-developed and well-nourished. No distress (but uncomfortable).  Cardiovascular: Normal rate.   Respiratory: Effort normal.  GI: Soft. She exhibits no distension. There is tenderness. There is guarding. There is no rebound.  Incisions well-approximated. Very small ecchymosis near umbilical incision   Musculoskeletal: Normal range of motion.  Neurological: She is alert and oriented to person, place, and time.  Skin: Skin is warm and dry.  Psychiatric: She has a normal mood and affect.    MAU Course  Procedures  MDM Discussed with Dr Ellyn Hack. Will get CT to eval for any abnormalities Ct Abdomen Pelvis W Contrast  01/19/2013   *RADIOLOGY REPORT*  Clinical Data: Laparoscopic tubal ligation/cautery 2 days ago, disproportionate pain, rule out hemorrhage or complication  CT ABDOMEN AND PELVIS WITH CONTRAST  Technique:  Multidetector CT imaging of the abdomen and pelvis was performed following the standard protocol during bolus administration of intravenous contrast.  Contrast:  100 ml Omnipaque-300 IV  Comparison: CT abdomen pelvis dated 07/04/2012  Findings: Trace bilateral pleural effusions.  Liver, spleen, pancreas, and adrenal glands are within normal limits.  Gallbladder is unremarkable.  No intrahepatic or extrahepatic ductal dilatation.  11 mm left interpolar left renal cyst (series 2/image 27). Moderate right hydronephrosis, although improved from the prior study, possibly chronic.  Prior right renal/ureteral calculi are no longer evident.  No evidence of bowel obstruction.  Normal appendix.  No evidence of abdominal aortic aneurysm.  No abdominopelvic ascites.  No hemoperitoneum or free air.  Uterus and bilateral ovaries are unremarkable, noting a 3.4 x 1.9 cm left ovarian cyst/follicle, likely physiologic.  Bladder is within normal limits.  Very mild degenerative changes of the visualized thoracolumbar spine.     IMPRESSION: No complication is evident status post laparoscopic tubal ligation.  Moderate right hydronephrosis, although improved from the prior study, possibly chronic.  Trace bilateral pleural effusions.     Original Report Authenticated By: Charline Bills, M.D.   Felt better after passing some gas.   Assessment and Plan  A:  Postoperative tubal ligation      Postoperative pain      Constipation  P:  Discussed with Dr Ellyn Hack      Discharge home       Miralax or suppository for constipation       Followup in office   [facility] 01/19/2013, 9:15 AM

## 2013-01-20 ENCOUNTER — Encounter (HOSPITAL_COMMUNITY): Payer: Self-pay | Admitting: Obstetrics and Gynecology

## 2013-01-21 ENCOUNTER — Other Ambulatory Visit: Payer: Self-pay | Admitting: Advanced Practice Midwife

## 2013-01-21 DIAGNOSIS — Z9851 Tubal ligation status: Secondary | ICD-10-CM

## 2013-01-21 MED ORDER — IOHEXOL 300 MG/ML  SOLN
100.0000 mL | Freq: Once | INTRAMUSCULAR | Status: AC | PRN
  Administered 2013-01-21: 100 mL via INTRAVENOUS

## 2013-01-21 MED ORDER — IOHEXOL 300 MG/ML  SOLN
50.0000 mL | Freq: Once | INTRAMUSCULAR | Status: AC | PRN
  Administered 2013-01-21: 50 mL via ORAL

## 2013-01-29 ENCOUNTER — Emergency Department (HOSPITAL_COMMUNITY): Payer: 59

## 2013-01-29 ENCOUNTER — Emergency Department (HOSPITAL_COMMUNITY)
Admission: EM | Admit: 2013-01-29 | Discharge: 2013-01-29 | Disposition: A | Discharge status: Home / Self Care | Payer: 59 | Attending: Emergency Medicine | Admitting: Emergency Medicine

## 2013-01-29 ENCOUNTER — Inpatient Hospital Stay (HOSPITAL_COMMUNITY)
Admission: AD | Admit: 2013-01-29 | Discharge: 2013-01-29 | Disposition: A | Discharge status: Home / Self Care | Payer: 59 | Source: Ambulatory Visit | Attending: Obstetrics and Gynecology | Admitting: Obstetrics and Gynecology

## 2013-01-29 ENCOUNTER — Encounter (HOSPITAL_COMMUNITY): Payer: Self-pay

## 2013-01-29 DIAGNOSIS — Z8719 Personal history of other diseases of the digestive system: Secondary | ICD-10-CM | POA: Insufficient documentation

## 2013-01-29 DIAGNOSIS — Z3202 Encounter for pregnancy test, result negative: Secondary | ICD-10-CM | POA: Insufficient documentation

## 2013-01-29 DIAGNOSIS — Z87448 Personal history of other diseases of urinary system: Secondary | ICD-10-CM | POA: Insufficient documentation

## 2013-01-29 DIAGNOSIS — IMO0002 Reserved for concepts with insufficient information to code with codable children: Secondary | ICD-10-CM | POA: Insufficient documentation

## 2013-01-29 DIAGNOSIS — Z87442 Personal history of urinary calculi: Secondary | ICD-10-CM | POA: Insufficient documentation

## 2013-01-29 DIAGNOSIS — Z5320 Procedure and treatment not carried out because of patient's decision for unspecified reasons: Secondary | ICD-10-CM | POA: Insufficient documentation

## 2013-01-29 DIAGNOSIS — M792 Neuralgia and neuritis, unspecified: Secondary | ICD-10-CM

## 2013-01-29 DIAGNOSIS — Z79899 Other long term (current) drug therapy: Secondary | ICD-10-CM | POA: Insufficient documentation

## 2013-01-29 DIAGNOSIS — R109 Unspecified abdominal pain: Secondary | ICD-10-CM | POA: Insufficient documentation

## 2013-01-29 LAB — CBC WITH DIFFERENTIAL/PLATELET
Basophils Absolute: 0 10*3/uL (ref 0.0–0.1)
Basophils Relative: 0 % (ref 0–1)
Eosinophils Absolute: 0 10*3/uL (ref 0.0–0.7)
Eosinophils Relative: 1 % (ref 0–5)
HCT: 41.9 % (ref 36.0–46.0)
Hemoglobin: 13.7 g/dL (ref 12.0–15.0)
Lymphocytes Relative: 46 % (ref 12–46)
Lymphs Abs: 2.6 10*3/uL (ref 0.7–4.0)
MCH: 27.5 pg (ref 26.0–34.0)
MCHC: 32.7 g/dL (ref 30.0–36.0)
MCV: 84.1 fL (ref 78.0–100.0)
Monocytes Absolute: 0.6 10*3/uL (ref 0.1–1.0)
Monocytes Relative: 10 % (ref 3–12)
Neutro Abs: 2.5 10*3/uL (ref 1.7–7.7)
Neutrophils Relative %: 44 % (ref 43–77)
Platelets: 276 10*3/uL (ref 150–400)
RBC: 4.98 MIL/uL (ref 3.87–5.11)
RDW: 14.2 % (ref 11.5–15.5)
WBC: 5.7 10*3/uL (ref 4.0–10.5)

## 2013-01-29 LAB — URINALYSIS, ROUTINE W REFLEX MICROSCOPIC
Bilirubin Urine: NEGATIVE
Glucose, UA: NEGATIVE mg/dL
Hgb urine dipstick: NEGATIVE
Ketones, ur: NEGATIVE mg/dL
Leukocytes, UA: NEGATIVE
Nitrite: NEGATIVE
Protein, ur: NEGATIVE mg/dL
Specific Gravity, Urine: 1.034 — ABNORMAL HIGH (ref 1.005–1.030)
Urobilinogen, UA: 0.2 mg/dL (ref 0.0–1.0)
pH: 5.5 (ref 5.0–8.0)

## 2013-01-29 LAB — WET PREP, GENITAL
Clue Cells Wet Prep HPF POC: NONE SEEN
Trich, Wet Prep: NONE SEEN
WBC, Wet Prep HPF POC: NONE SEEN
Yeast Wet Prep HPF POC: NONE SEEN

## 2013-01-29 LAB — COMPREHENSIVE METABOLIC PANEL
ALT: 19 U/L (ref 0–35)
AST: 15 U/L (ref 0–37)
Albumin: 4 g/dL (ref 3.5–5.2)
Alkaline Phosphatase: 59 U/L (ref 39–117)
BUN: 12 mg/dL (ref 6–23)
CO2: 25 mEq/L (ref 19–32)
Calcium: 9.5 mg/dL (ref 8.4–10.5)
Chloride: 105 mEq/L (ref 96–112)
Creatinine, Ser: 0.76 mg/dL (ref 0.50–1.10)
GFR calc Af Amer: >90 mL/min (ref >90)
GFR calc non Af Amer: >90 mL/min (ref >90)
Glucose, Bld: 88 mg/dL (ref 70–99)
Potassium: 3.7 mEq/L (ref 3.5–5.1)
Sodium: 139 mEq/L (ref 135–145)
Total Bilirubin: 0.5 mg/dL (ref 0.3–1.2)
Total Protein: 7.4 g/dL (ref 6.0–8.3)

## 2013-01-29 LAB — POCT PREGNANCY, URINE: Preg Test, Ur: NEGATIVE

## 2013-01-29 MED ORDER — MORPHINE SULFATE 4 MG/ML IJ SOLN
4.0000 mg | Freq: Once | INTRAMUSCULAR | Status: AC
  Administered 2013-01-29: 4 mg via INTRAVENOUS
  Filled 2013-01-29: qty 1

## 2013-01-29 MED ORDER — SODIUM CHLORIDE 0.9 % IV SOLN
Freq: Once | INTRAVENOUS | Status: AC
  Administered 2013-01-29: 16:00:00 via INTRAVENOUS

## 2013-01-29 MED ORDER — OXYCODONE-ACETAMINOPHEN 5-325 MG PO TABS: 2.0000 | ORAL_TABLET | Freq: Four times a day (QID) | ORAL | Status: DC | PRN

## 2013-01-29 MED ORDER — GABAPENTIN 300 MG PO CAPS: 300.0000 mg | ORAL_CAPSULE | Freq: Three times a day (TID) | ORAL | Status: DC

## 2013-01-29 MED ORDER — ONDANSETRON HCL 4 MG/2ML IJ SOLN
4.0000 mg | Freq: Once | INTRAMUSCULAR | Status: AC
  Administered 2013-01-29: 4 mg via INTRAVENOUS
  Filled 2013-01-29: qty 2

## 2013-01-29 NOTE — ED Notes (Signed)
Pt had tubal ligation on 8/1.  Has had worsening abd pain since then.  Pt states that now she can barely walk.  Denies NVD.  No fever.  Has not had a BM since Sunday.  Was taking miralax but stopped taking it because she was having too many stools.  States that she gets SOB when she walks because of the pain.

## 2013-01-29 NOTE — ED Notes (Signed)
Bed: ZO10 Expected date:  Expected time:  Means of arrival:  Comments: Susa Griffins

## 2013-01-29 NOTE — MAU Note (Signed)
Patient is not in the lobby when called to triage.  

## 2013-01-29 NOTE — ED Provider Notes (Signed)
TIME SEEN: 3:21 PM  CHIEF COMPLAINT: Abdominal pain  HPI: Patient is a 37 year old pleasant female with a history of anxiety, nephrolithiasis with prior lithotripsy with recent elective BTL by Dr. Tracey Harries on 01/17/13 presents emergency department with pain in her left lower quadrant since her surgery. She states that she was seen in the emergency department on 01/19/13 and had a negative CT scan. She was seen by her OB/GYN he reports he believes this is due to nerve damage and possible hematoma. She states that she try his recommendations of heat pads, pain medication and rest and her pain improved but over the weekend became worse. It is worse with movement. Better with lying flat. She denies any fever, nausea, vomiting, diarrhea. Her last bowel movement was 3 days ago. She denies any dysuria or hematuria. She has had some brown discharge since surgery. No bright red vaginal bleeding. She was seen by her OB/GYN again today he fell again this was likely due to nerve damage from her surgery. Patient reports that she however was unable to tolerate her pain and came to the emergency department for further evaluation.  ROS: See HPI Constitutional: no fever  Eyes: no drainage  ENT: no runny nose   Cardiovascular:  no chest pain  Resp: no SOB  GI: no vomiting GU: no dysuria Integumentary: no rash  Allergy: no hives  Musculoskeletal: no leg swelling  Neurological: no slurred speech ROS otherwise negative  PAST MEDICAL HISTORY/PAST SURGICAL HISTORY:  Past Medical History  Diagnosis Date  . Right ureteral stone   . History of kidney stones   . Frequency of urination   . Urgency of urination   . Nocturia   . Dysuria   . Hematuria   . Constipation     MEDICATIONS:  Prior to Admission medications   Medication Sig Start Date End Date Taking? Authorizing Provider  ALPRAZolam Prudy Feeler) 1 MG tablet Take 1 mg by mouth daily as needed for anxiety.     Historical Provider, MD  buPROPion (WELLBUTRIN  XL) 300 MG 24 hr tablet Take 300 mg by mouth daily.    Historical Provider, MD  etonogestrel (NEXPLANON) 68 MG IMPL implant Inject 1 each into the skin once.    Historical Provider, MD  ibuprofen (ADVIL,MOTRIN) 600 MG tablet Take 1 tablet (600 mg total) by mouth every 6 (six) hours as needed for pain. 01/17/13   Bing Plume, MD  oxyCODONE-acetaminophen (ROXICET) 5-325 MG per tablet Take 1 tablet by mouth every 4 (four) hours as needed for pain. 01/17/13   Bing Plume, MD  polyethylene glycol [facility]) packet Take 17 g by mouth daily. 01/19/13   Aviva Signs, CNM    ALLERGIES:  Allergies  Allergen Reactions  . Dilaudid [Hydromorphone Hcl] Shortness Of Breath and Nausea And Vomiting    Pt has takes percocet at home (see med rec).  . Ceftriaxone Sodium Other (See Comments)    Hair loss  . Latex Rash    SOCIAL HISTORY:  History  Substance Use Topics  . Smoking status: Never Smoker   . Smokeless tobacco: Never Used  . Alcohol Use: Yes     Comment: occ    FAMILY HISTORY: No family history on file.  EXAM: BP 137/85  Pulse 63  Temp(Src) 98 F (36.7 C) (Oral)  Resp 20  SpO2 97%  LMP 01/05/2013 CONSTITUTIONAL: Alert and oriented and responds appropriately to questions. Well-appearing; well-nourished HEAD: Normocephalic EYES: Conjunctivae clear, PERRL ENT: normal nose; no rhinorrhea;  moist mucous membranes; pharynx without lesions noted NECK: Supple, no meningismus, no LAD  CARD: RRR; S1 and S2 appreciated; no murmurs, no clicks, no rubs, no gallops RESP: Normal chest excursion without splinting or tachypnea; breath sounds clear and equal bilaterally; no wheezes, no rhonchi, no rales,  ABD/GI: Normal bowel sounds; non-distended; soft, tender to palpation to very light palpation over her left lower quadrant, no rebound or guarding, no peritoneal signs, minimal ecchymosis around the left lower quadrant, no palpable mass, incision in the umbilicus is clean, dry and intact GU:   Normal external genitalia without lesions, dark blood from cervix without obvious laceration, some mild cervical motion tenderness as well as left adnexal tenderness but no fullness, no vaginal discharge BACK:  The back appears normal and is non-tender to palpation, there is no CVA tenderness EXT: Normal ROM in all joints; non-tender to palpation; no edema; normal capillary refill; no cyanosis    SKIN: Normal color for age and race; warm NEURO: Moves all extremities equally PSYCH: The patient's mood and manner are appropriate. Grooming and personal hygiene are appropriate.  MEDICAL DECISION MAKING: Patient with left lower quadrant pain after bilateral tubal ligation 12 days ago. Pain is likely due to superficial nerve injury, hematoma. She has had normal CT scan 10 days ago. Her CT did show some left hydronephrosis which has been to prior CTs and is likely chronic. She states her pain is different than her prior nephrolithiasis. Will repeat labs, urine, pelvic exam with cultures, abdominal x-ray to evaluate for obstruction versus constipation. At this time, I do not feel patient needs another CT scan. We'll give IV fluids, antiemetics and pain medication.  ED PROGRESS:  5:50 PM  Pt's workup in the emergency department has been negative. She has no leukocytosis. No signs of hematuria or urinary tract infection. Her workup is negative. Her x-ray shows no constipation obvious kidney stone. Discussed with patient's OB/GYN, Dr. Betsy Pries. He agrees with discharge home with outpatient followup as needed.  He also agrees that patient may have a component of neuropathic pain and agrees with a round of gabapentin. Discussed with patient who agrees with plan. Given customary usual return precautions.  Layla Maw Juvencio Verdi, DO 01/29/13 1751

## 2013-01-29 NOTE — ED Notes (Signed)
Pt presents with severe stomach pain LLQ pain after recent tubal ligation.  Seen at ED this past Sunday treated and released. Seen at GYN on Monday for same complaint. Pt at [facility] and decided not to stay. Here for evaluation of stomach pain.  Pt has take percocet yet not today without relief.  Has take motrin for pain.

## 2013-01-30 LAB — GC/CHLAMYDIA PROBE AMP
CT Probe RNA: NEGATIVE
GC Probe RNA: NEGATIVE

## 2013-06-04 ENCOUNTER — Ambulatory Visit (INDEPENDENT_AMBULATORY_CARE_PROVIDER_SITE_OTHER): Payer: 59 | Admitting: Family Medicine

## 2013-06-04 VITALS — BP 110/68 | HR 74 | Temp 98.5°F | Resp 18 | Ht 69.0 in | Wt 198.0 lb

## 2013-06-04 DIAGNOSIS — N200 Calculus of kidney: Secondary | ICD-10-CM

## 2013-06-04 DIAGNOSIS — R5383 Other fatigue: Secondary | ICD-10-CM

## 2013-06-04 DIAGNOSIS — R3915 Urgency of urination: Secondary | ICD-10-CM

## 2013-06-04 DIAGNOSIS — R109 Unspecified abdominal pain: Secondary | ICD-10-CM

## 2013-06-04 LAB — POCT URINALYSIS DIPSTICK
Bilirubin, UA: NEGATIVE
Glucose, UA: NEGATIVE
Leukocytes, UA: NEGATIVE
Nitrite, UA: NEGATIVE
Spec Grav, UA: >=1.03
Urobilinogen, UA: 0.2
pH, UA: 5.5

## 2013-06-04 LAB — POCT UA - MICROSCOPIC ONLY
Bacteria, U Microscopic: NEGATIVE
Casts, Ur, LPF, POC: NEGATIVE
Crystals, Ur, HPF, POC: NEGATIVE
WBC, Ur, HPF, POC: NEGATIVE
Yeast, UA: NEGATIVE

## 2013-06-04 MED ORDER — IBUPROFEN 600 MG PO TABS: 600.0000 mg | ORAL_TABLET | Freq: Four times a day (QID) | ORAL | Status: DC | PRN

## 2013-06-04 NOTE — Progress Notes (Signed)
Results for orders placed in visit on 06/04/13  POCT URINALYSIS DIPSTICK      Result Value Range   Color, UA yellow     Clarity, UA cloudy     Glucose, UA neg     Bilirubin, UA neg     Ketones, UA trace     Spec Grav, UA >=1.030     Blood, UA moderate     pH, UA 5.5     Protein, UA trace     Urobilinogen, UA 0.2     Nitrite, UA neg     Leukocytes, UA Negative    POCT UA - MICROSCOPIC ONLY      Result Value Range   WBC, Ur, HPF, POC neg     RBC, urine, microscopic 0-1     Bacteria, U Microscopic neg     Mucus, UA large     Epithelial cells, urine per micros 0-2     Crystals, Ur, HPF, POC neg     Casts, Ur, LPF, POC neg     Yeast, UA neg     Amorphous large

## 2013-06-04 NOTE — Progress Notes (Signed)
Patient ID: Misty [name] MRN: [id number], DOB: 09-15-1975, 37 y.o. Date of Encounter: 06/04/2013, 5:02 PM  Primary Physician: Cala Bradford, MD  Chief Complaint:  Chief Complaint  Patient presents with   Urinary Tract Infection    abdominal pain today, urge   Fatigue    HPI: 37 y.o. year old female with a h/o kidney stones presents with 1 day history of dysuria, urgency, hematuria, and frequency. Pt also c/o right sided flank pain located over her right kidney, and abdominal pain.   LMP: Pt is current on her menstrual cycle, and feels some her abdominal discomfort may be related to her usual monthly menstrual cramps.  No sick contacts, recent antibiotics, or recent travels.   No vaginal discharge, back pain, fever  Urologist- Marcine Matar  MD  Pt currently works at [employer]  Past Medical History  Diagnosis Date   Right ureteral stone    History of kidney stones    Frequency of urination    Urgency of urination    Nocturia    Dysuria    Hematuria    Constipation      Home Meds: Prior to Admission medications   Medication Sig Start Date End Date Taking? Authorizing Provider  ALPRAZolam Prudy Feeler) 1 MG tablet Take 1 mg by mouth daily as needed for anxiety.    Yes Historical Provider, MD  ibuprofen (ADVIL,MOTRIN) 600 MG tablet Take 1 tablet (600 mg total) by mouth every 6 (six) hours as needed for pain. 01/17/13  Yes Bing Plume, MD  buPROPion (WELLBUTRIN XL) 300 MG 24 hr tablet Take 300 mg by mouth daily.    Historical Provider, MD  etonogestrel (NEXPLANON) 68 MG IMPL implant Inject 1 each into the skin once.    Historical Provider, MD  gabapentin (NEURONTIN) 300 MG capsule Take 1 capsule (300 mg total) by mouth 3 (three) times daily. 01/29/13   Kristen N Ward, DO  naproxen sodium (ANAPROX) 220 MG tablet Take 220 mg by mouth 2 (two) times daily with a meal.    Historical Provider, MD  oxyCODONE-acetaminophen (PERCOCET/ROXICET) 5-325 MG per tablet Take  2 tablets by mouth every 6 (six) hours as needed for pain. 01/29/13   Kristen N Ward, DO  oxyCODONE-acetaminophen (ROXICET) 5-325 MG per tablet Take 1 tablet by mouth every 4 (four) hours as needed for pain. 01/17/13   Bing Plume, MD  polyethylene glycol [facility]) packet Take 17 g by mouth daily. 01/19/13   Aviva Signs, CNM    Allergies:  Allergies  Allergen Reactions   Dilaudid [Hydromorphone Hcl] Shortness Of Breath and Nausea And Vomiting    Pt has takes percocet at home (see med rec).   Ceftriaxone Sodium Other (See Comments)    Hair loss   Latex Rash    History   Social History   Marital Status: Married    Spouse Name: N/A    Number of Children: N/A   Years of Education: N/A   Occupational History   Not on file.   Social History Main Topics   Smoking status: Never Smoker    Smokeless tobacco: Never Used   Alcohol Use: Yes     Comment: occ   Drug Use: No   Sexual Activity: Not on file   Other Topics Concern   Not on file   Social History Narrative   No narrative on file     Review of Systems: Constitutional: negative for chills, fever, night sweats or weight changes  Cardiovascular: negative for chest pain or palpitations Respiratory: negative for hemoptysis, wheezing, or shortness of breath Abdominal: negative for abdominal pain, nausea, vomiting or diarrhea Dermatological: negative for rash Neurologic: negative for headache   Physical Exam: Blood pressure 110/68, pulse 74, temperature 98.5 F (36.9 C), temperature source Oral, resp. rate 18, height 5\' 9"  (1.753 m), weight 198 lb (89.812 kg), last menstrual period 05/08/2013, SpO2 100.00%., Body mass index is 29.23 kg/(m^2). General: Well developed, well nourished, in no acute distress. Head: Normocephalic, atraumatic, eyes without discharge, sclera non-icteric, nares are congested. Bilateral auditory canals clear, TM's are without perforation, pearly grey with reflective cone of light  bilaterally. Serous effusion bilaterally behind TM's. Maxillary sinus TTP. Oral cavity moist, dentition normal. Posterior pharynx with post nasal drip and mild erythema. No peritonsillar abscess or tonsillar exudate. Neck: Supple. No thyromegaly. Full ROM. No lymphadenopathy. Lungs: Coarse breath sounds bilaterally without Clear bilaterally to auscultation without wheezes, rales, or rhonchi. Breathing is unlabored.  Heart: RRR with S1 S2. No murmurs, rubs, or gallops appreciated. Abdomen: Soft, non-tender, non-distended with normoactive bowel sounds. No hepatosplenomegaly. No rebound/guarding. No obvious abdominal masses. McBurney's, Rovsing's, Iliopsoas, and table jar all negative. Msk:  Strength and tone normal for age. Extremities: No clubbing or cyanosis. No edema. Neuro: Alert and oriented X 3. Moves all extremities spontaneously. CNII-XII grossly in tact. Psych:  Responds to questions appropriately with a normal affect.   Labs: Results for orders placed in visit on 06/04/13  POCT URINALYSIS DIPSTICK      Result Value Range   Color, UA yellow     Clarity, UA cloudy     Glucose, UA neg     Bilirubin, UA neg     Ketones, UA trace     Spec Grav, UA >=1.030     Blood, UA moderate     pH, UA 5.5     Protein, UA trace     Urobilinogen, UA 0.2     Nitrite, UA neg     Leukocytes, UA Negative    POCT UA - MICROSCOPIC ONLY      Result Value Range   WBC, Ur, HPF, POC neg     RBC, urine, microscopic 0-1     Bacteria, U Microscopic neg     Mucus, UA large     Epithelial cells, urine per micros 0-2     Crystals, Ur, HPF, POC neg     Casts, Ur, LPF, POC neg     Yeast, UA neg     Amorphous large        ASSESSMENT AND PLAN:  37 y.o. year old female with presumed kidney stone -Abdominal pain - Plan: POCT urinalysis dipstick, POCT UA - Microscopic Only, ibuprofen (ADVIL,MOTRIN) 600 MG tablet  Urgency of urination - Plan: POCT urinalysis dipstick, POCT UA - Microscopic Only  Fatigue -  Plan: POCT urinalysis dipstick, POCT UA - Microscopic Only  Right flank pain  Kidney stones   -Mucinex -Tylenol/Motrin prn -Rest/fluids -RTC precautions -RTC 3-5 days if no improvement  Signed, Elvina Sidle, MD 06/04/2013 5:02 PM

## 2013-06-04 NOTE — Patient Instructions (Signed)
Kidney Stones  Kidney stones (urolithiasis) are deposits that form inside your kidneys. The intense pain is caused by the stone moving through the urinary tract. When the stone moves, the ureter goes into spasm around the stone. The stone is usually passed in the urine.   CAUSES   · A disorder that makes certain neck glands produce too much parathyroid hormone (primary hyperparathyroidism).  · A buildup of uric acid crystals, similar to gout in your joints.  · Narrowing (stricture) of the ureter.  · A kidney obstruction present at birth (congenital obstruction).  · Previous surgery on the kidney or ureters.  · Numerous kidney infections.  SYMPTOMS   · Feeling sick to your stomach (nauseous).  · Throwing up (vomiting).  · Blood in the urine (hematuria).  · Pain that usually spreads (radiates) to the groin.  · Frequency or urgency of urination.  DIAGNOSIS   · Taking a history and physical exam.  · Blood or urine tests.  · CT scan.  · Occasionally, an examination of the inside of the urinary bladder (cystoscopy) is performed.  TREATMENT   · Observation.  · Increasing your fluid intake.  · Extracorporeal shock wave lithotripsy This is a noninvasive procedure that uses shock waves to break up kidney stones.  · Surgery may be needed if you have severe pain or persistent obstruction. There are various surgical procedures. Most of the procedures are performed with the use of small instruments. Only small incisions are needed to accommodate these instruments, so recovery time is minimized.  The size, location, and chemical composition are all important variables that will determine the proper choice of action for you. Talk to your health care provider to better understand your situation so that you will minimize the risk of injury to yourself and your kidney.   HOME CARE INSTRUCTIONS   · Drink enough water and fluids to keep your urine clear or pale yellow. This will help you to pass the stone or stone fragments.  · Strain  all urine through the provided strainer. Keep all particulate matter and stones for your health care provider to see. The stone causing the pain may be as small as a grain of salt. It is very important to use the strainer each and every time you pass your urine. The collection of your stone will allow your health care provider to analyze it and verify that a stone has actually passed. The stone analysis will often identify what you can do to reduce the incidence of recurrences.  · Only take over-the-counter or prescription medicines for pain, discomfort, or fever as directed by your health care provider.  · Make a follow-up appointment with your health care provider as directed.  · Get follow-up X-rays if required. The absence of pain does not always mean that the stone has passed. It may have only stopped moving. If the urine remains completely obstructed, it can cause loss of kidney function or even complete destruction of the kidney. It is your responsibility to make sure X-rays and follow-ups are completed. Ultrasounds of the kidney can show blockages and the status of the kidney. Ultrasounds are not associated with any radiation and can be performed easily in a matter of minutes.  SEEK MEDICAL CARE IF:  · You experience pain that is progressive and unresponsive to any pain medicine you have been prescribed.  SEEK IMMEDIATE MEDICAL CARE IF:   · Pain cannot be controlled with the prescribed medicine.  · You have a fever   or shaking chills.  · The severity or intensity of pain increases over 18 hours and is not relieved by pain medicine.  · You develop a new onset of abdominal pain.  · You feel faint or pass out.  · You are unable to urinate.  MAKE SURE YOU:   · Understand these instructions.  · Will watch your condition.  · Will get help right away if you are not doing well or get worse.  Document Released: 06/05/2005 Document Revised: 02/05/2013 Document Reviewed: 11/06/2012  Exit[employer]® Patient Information ©2014  [employer].

## 2015-01-20 ENCOUNTER — Other Ambulatory Visit: Payer: Self-pay | Admitting: Obstetrics and Gynecology

## 2015-01-20 DIAGNOSIS — R928 Other abnormal and inconclusive findings on diagnostic imaging of breast: Secondary | ICD-10-CM

## 2015-01-22 ENCOUNTER — Ambulatory Visit
Admission: RE | Admit: 2015-01-22 | Discharge: 2015-01-22 | Disposition: A | Discharge status: Home / Self Care | Payer: 59 | Source: Ambulatory Visit | Attending: Obstetrics and Gynecology | Admitting: Obstetrics and Gynecology

## 2015-01-22 DIAGNOSIS — R928 Other abnormal and inconclusive findings on diagnostic imaging of breast: Secondary | ICD-10-CM

## 2015-06-22 MED FILL — ALPRAZolam 1 MG TABS: 1 | 90 days supply | Qty: 360 | Fill #0

## 2015-06-28 MED FILL — MONTELUKAST SOD 10 MG TAB: 10 | 30 days supply | Qty: 30 | Fill #0

## 2015-06-29 MED FILL — D-AMPHETAMINE ER 15 MG CAP: 15 | 90 days supply | Qty: 270 | Fill #0

## 2015-07-06 MED FILL — FLUCONAZOLE 150 MG TABLET: 150 | 1 days supply | Qty: 1 | Fill #1

## 2015-07-10 ENCOUNTER — Encounter (HOSPITAL_COMMUNITY): Payer: Self-pay | Admitting: Emergency Medicine

## 2015-07-10 ENCOUNTER — Emergency Department (HOSPITAL_COMMUNITY): Payer: 59

## 2015-07-10 ENCOUNTER — Emergency Department (HOSPITAL_COMMUNITY)
Admission: EM | Admit: 2015-07-10 | Discharge: 2015-07-10 | Disposition: A | Discharge status: Home / Self Care | Payer: 59 | Attending: Emergency Medicine | Admitting: Emergency Medicine

## 2015-07-10 DIAGNOSIS — G43809 Other migraine, not intractable, without status migrainosus: Secondary | ICD-10-CM | POA: Diagnosis not present

## 2015-07-10 DIAGNOSIS — Z87442 Personal history of urinary calculi: Secondary | ICD-10-CM | POA: Insufficient documentation

## 2015-07-10 DIAGNOSIS — R51 Headache: Secondary | ICD-10-CM | POA: Diagnosis not present

## 2015-07-10 DIAGNOSIS — Z79899 Other long term (current) drug therapy: Secondary | ICD-10-CM | POA: Diagnosis not present

## 2015-07-10 DIAGNOSIS — Z91040 Latex allergy status: Secondary | ICD-10-CM | POA: Insufficient documentation

## 2015-07-10 MED ORDER — PROCHLORPERAZINE EDISYLATE 5 MG/ML IJ SOLN
10.0000 mg | Freq: Once | INTRAMUSCULAR | Status: AC
  Administered 2015-07-10: 10 mg via INTRAVENOUS
  Filled 2015-07-10: qty 2

## 2015-07-10 MED ORDER — SODIUM CHLORIDE 0.9 % IV BOLUS (SEPSIS)
1000.0000 mL | Freq: Once | INTRAVENOUS | Status: AC
  Administered 2015-07-10: 1000 mL via INTRAVENOUS

## 2015-07-10 MED ORDER — KETOROLAC TROMETHAMINE 30 MG/ML IJ SOLN
30.0000 mg | Freq: Once | INTRAMUSCULAR | Status: AC
  Administered 2015-07-10: 30 mg via INTRAVENOUS
  Filled 2015-07-10: qty 1

## 2015-07-10 MED ORDER — DEXAMETHASONE SODIUM PHOSPHATE 10 MG/ML IJ SOLN
10.0000 mg | Freq: Once | INTRAMUSCULAR | Status: AC
  Administered 2015-07-10: 10 mg via INTRAVENOUS
  Filled 2015-07-10: qty 1

## 2015-07-10 MED ORDER — DIPHENHYDRAMINE HCL 50 MG/ML IJ SOLN
25.0000 mg | Freq: Once | INTRAMUSCULAR | Status: AC
  Administered 2015-07-10: 25 mg via INTRAVENOUS
  Filled 2015-07-10: qty 1

## 2015-07-10 MED ORDER — MAGNESIUM SULFATE 2 GM/50ML IV SOLN
2.0000 g | Freq: Once | INTRAVENOUS | Status: AC
  Administered 2015-07-10: 2 g via INTRAVENOUS
  Filled 2015-07-10: qty 50

## 2015-07-10 NOTE — ED Notes (Signed)
Pt is employee that was working and had sudden onset migraine that has affected her vision. Pt sts that "it hurts too bad to see." Pt reports that she tool 1 ibuprofen and 1 excedrin migraine tablet. Pt is A&O and in NAD

## 2015-07-10 NOTE — Discharge Instructions (Signed)
Go home and sleep the rest of this off.  Follow up with your doctor. You may need to see a neurologist if you continue to have these headaches.  Migraine Headache A migraine headache is an intense, throbbing pain on one or both sides of your head. A migraine can last for 30 minutes to several hours. CAUSES  The exact cause of a migraine headache is not always known. However, a migraine may be caused when nerves in the brain become irritated and release chemicals that cause inflammation. This causes pain. Certain things may also trigger migraines, such as:  Alcohol.  Smoking.  Stress.  Menstruation.  Aged cheeses.  Foods or drinks that contain nitrates, glutamate, aspartame, or tyramine.  Lack of sleep.  Chocolate.  Caffeine.  Hunger.  Physical exertion.  Fatigue.  Medicines used to treat chest pain (nitroglycerine), birth control pills, estrogen, and some blood pressure medicines. SIGNS AND SYMPTOMS  Pain on one or both sides of your head.  Pulsating or throbbing pain.  Severe pain that prevents daily activities.  Pain that is aggravated by any physical activity.  Nausea, vomiting, or both.  Dizziness.  Pain with exposure to bright lights, loud noises, or activity.  General sensitivity to bright lights, loud noises, or smells. Before you get a migraine, you may get warning signs that a migraine is coming (aura). An aura may include:  Seeing flashing lights.  Seeing bright spots, halos, or zigzag lines.  Having tunnel vision or blurred vision.  Having feelings of numbness or tingling.  Having trouble talking.  Having muscle weakness. DIAGNOSIS  A migraine headache is often diagnosed based on:  Symptoms.  Physical exam.  A CT scan or MRI of your head. These imaging tests cannot diagnose migraines, but they can help rule out other causes of headaches. TREATMENT Medicines may be given for pain and nausea. Medicines can also be given to help prevent  recurrent migraines.  HOME CARE INSTRUCTIONS  Only take over-the-counter or prescription medicines for pain or discomfort as directed by your health care provider. The use of long-term narcotics is not recommended.  Lie down in a dark, quiet room when you have a migraine.  Keep a journal to find out what may trigger your migraine headaches. For example, write down:  What you eat and drink.  How much sleep you get.  Any change to your diet or medicines.  Limit alcohol consumption.  Quit smoking if you smoke.  Get 7-9 hours of sleep, or as recommended by your health care provider.  Limit stress.  Keep lights dim if bright lights bother you and make your migraines worse. SEEK IMMEDIATE MEDICAL CARE IF:   Your migraine becomes severe.  You have a fever.  You have a stiff neck.  You have vision loss.  You have muscular weakness or loss of muscle control.  You start losing your balance or have trouble walking.  You feel faint or pass out.  You have severe symptoms that are different from your first symptoms. MAKE SURE YOU:   Understand these instructions.  Will watch your condition.  Will get help right away if you are not doing well or get worse.   This information is not intended to replace advice given to you by your health care provider. Make sure you discuss any questions you have with your health care provider.   Document Released: 06/05/2005 Document Revised: 06/26/2014 Document Reviewed: 02/10/2013 Elsevier Interactive Patient Education [employer].

## 2015-07-10 NOTE — ED Notes (Signed)
Awake. Verbally responsive. A/O x4. Resp even and unlabored. No audible adventitious breath sounds noted. ABC's intact.  

## 2015-07-10 NOTE — ED Notes (Signed)
Pt complaint of migraine just prior to checking in; right side.

## 2015-07-10 NOTE — ED Notes (Signed)
Resting quietly with eye closed. Easily arousable. Verbally responsive. Resp even and unlabored. ABC's intact. IV saline lock patent and intact. Family at bedside.  

## 2015-07-10 NOTE — ED Provider Notes (Signed)
CSN: [id number]     Arrival date & time 07/10/15  1210 History   First MD Initiated Contact with Patient 07/10/15 1215     Chief Complaint  Patient presents with  . Migraine     (Consider location/radiation/quality/duration/timing/severity/associated sxs/prior Treatment) Patient is a 40 y.o. female presenting with migraines. The history is provided by the patient.  Migraine This is a recurrent problem. The current episode started 1 to 2 hours ago. The problem occurs constantly. The problem has been rapidly worsening. Associated symptoms include headaches. Pertinent negatives include no chest pain and no shortness of breath. Exacerbated by: bright lights and loud noises. Nothing relieves the symptoms. She has tried acetaminophen for the symptoms. The treatment provided no relief.   40 yo F with a chief complaint of a headache. Patient states this was slowly come on this morning. Patient then started having some mild difficulty with her vision. Tried a ibuprofen and Excedrin tablet without relief. Headache then got much worse. Patient has a history of migraines in the past. Had similar symptoms to this. Having nausea photophobia phonophobia. Has been under a lot of stress recently. Denies any head injuries.   Past Medical History  Diagnosis Date  . Right ureteral stone   . History of kidney stones   . Frequency of urination   . Urgency of urination   . Nocturia   . Dysuria   . Hematuria   . Constipation    Past Surgical History  Procedure Laterality Date  . Extracorporeal shock wave lithotripsy  08-02-2009    RIGHT  . Cystolitholapaxy of large bladder calculi/   right ureteral stent exchange  04-15-2010  . Cysto w/ right ureteroscopy  2006  . Cysto/ right ureteral stent placement  07-29-2009    LARGE RIGHT RENAL CALCULUS  . Cystoscopy with stent placement  07/04/2012    Procedure: CYSTOSCOPY WITH STENT PLACEMENT;  Surgeon: Franchot Gallo, MD;  Location: [facility];  Service: Urology;  Laterality: Right;  rt dbl j stent placement   . Cystoscopy/retrograde/ureteroscopy/stone extraction with basket  07/25/2012    Procedure: CYSTOSCOPY/RETROGRADE/URETEROSCOPY/STONE EXTRACTION WITH BASKET;  Surgeon: Franchot Gallo, MD;  Location: [facility];  Service: Urology;  Laterality: Right;  . Holmium laser application  [id number]    Procedure: HOLMIUM LASER APPLICATION;  Surgeon: Franchot Gallo, MD;  Location: [facility];  Service: Urology;  Laterality: Right;  . Laparoscopic tubal ligation Bilateral 01/17/2013    Procedure: LAPAROSCOPIC TUBAL LIGATION;  Surgeon: Melina Schools, MD;  Location: [facility];  Service: Gynecology;  Laterality: Bilateral;  1hr OR time    No family history on file. Social History  Substance Use Topics  . Smoking status: Never Smoker   . Smokeless tobacco: Never Used  . Alcohol Use: Yes     Comment: occ   OB History    Gravida Para Term Preterm AB TAB SAB Ectopic Multiple Living   7    2 1 1   5      Review of Systems  Constitutional: Negative for fever and chills.  HENT: Negative for congestion and rhinorrhea.   Eyes: Negative for redness and visual disturbance.  Respiratory: Negative for shortness of breath and wheezing.   Cardiovascular: Negative for chest pain and palpitations.  Gastrointestinal: Negative for nausea and vomiting.  Genitourinary: Negative for dysuria and urgency.  Musculoskeletal: Negative for myalgias and arthralgias.  Skin: Negative for pallor and wound.  Neurological: Positive for headaches. Negative for dizziness.  Blurry vision      Allergies  Dilaudid; Ceftriaxone sodium; and Latex  Home Medications   Prior to Admission medications   Medication Sig Start Date End Date Taking? Authorizing Provider  ALPRAZolam Duanne Moron) 1 MG tablet Take 1 mg by mouth daily as needed for anxiety.    Yes Historical Provider, MD  aspirin-acetaminophen-caffeine (EXCEDRIN  MIGRAINE) [phone] MG tablet Take 1 tablet by mouth every 6 (six) hours as needed for headache.   Yes Historical Provider, MD  dextroamphetamine (DEXEDRINE SPANSULE) 15 MG 24 hr capsule Take 1 capsule by mouth 3 (three) times daily. 06/29/15  Yes Historical Provider, MD  etonogestrel (NEXPLANON) 68 MG IMPL implant Inject 1 each into the skin once. Reported on 07/10/2015   Yes Historical Provider, MD  ibuprofen (ADVIL,MOTRIN) 600 MG tablet Take 1 tablet (600 mg total) by mouth every 6 (six) hours as needed. Patient taking differently: Take 600 mg by mouth every 6 (six) hours as needed for headache or moderate pain.  06/04/13  Yes Robyn Haber, MD  montelukast (SINGULAIR) 10 MG tablet Take 1 tablet by mouth daily. 06/28/15  Yes Historical Provider, MD  naproxen sodium (ANAPROX) 220 MG tablet Take 220 mg by mouth daily as needed (for pain).    Yes Historical Provider, MD  nystatin-triamcinolone (MYCOLOG II) cream Apply 1 application topically daily as needed. For skin irritation 07/06/15  Yes Historical Provider, MD  gabapentin (NEURONTIN) 300 MG capsule Take 1 capsule (300 mg total) by mouth 3 (three) times daily. Patient not taking: Reported on 07/10/2015 01/29/13   Delice Bison Ward, DO  oxyCODONE-acetaminophen (PERCOCET/ROXICET) 5-325 MG per tablet Take 2 tablets by mouth every 6 (six) hours as needed for pain. Patient not taking: Reported on 07/10/2015 01/29/13   Delice Bison Ward, DO  oxyCODONE-acetaminophen (ROXICET) 5-325 MG per tablet Take 1 tablet by mouth every 4 (four) hours as needed for pain. Patient not taking: Reported on 07/10/2015 01/17/13   Newton Pigg, MD  polyethylene glycol [facility]) packet Take 17 g by mouth daily. Patient not taking: Reported on 07/10/2015 01/19/13   Seabron Spates, CNM   BP 124/73 mmHg  Pulse 89  Temp(Src) 97.7 F (36.5 C) (Oral)  Resp 18  SpO2 100%  LMP 07/03/2015 Physical Exam  Constitutional: She is oriented to person, place, and time. She appears well-developed  and well-nourished. No distress.  HENT:  Head: Normocephalic and atraumatic.  Eyes: EOM are normal. Pupils are equal, round, and reactive to light.  Neck: Normal range of motion. Neck supple.  Cardiovascular: Normal rate and regular rhythm.  Exam reveals no gallop and no friction rub.   No murmur heard. Pulmonary/Chest: Effort normal. She has no wheezes. She has no rales.  Abdominal: Soft. She exhibits no distension. There is no tenderness. There is no rebound.  Musculoskeletal: She exhibits no edema or tenderness.  Neurological: She is alert and oriented to person, place, and time. She has normal strength. No cranial nerve deficit or sensory deficit. Coordination and gait normal. GCS eye subscore is 4. GCS verbal subscore is 5. GCS motor subscore is 6.  Skin: Skin is warm and dry. She is not diaphoretic.  Psychiatric: She has a normal mood and affect. Her behavior is normal.  Nursing note and vitals reviewed.   ED Course  Procedures (including critical care time) Labs Review Labs Reviewed - No data to display  Imaging Review Ct Head Wo Contrast  07/10/2015  CLINICAL DATA:  Headache. EXAM: CT HEAD WITHOUT CONTRAST TECHNIQUE: Contiguous axial  images were obtained from the base of the skull through the vertex without intravenous contrast. COMPARISON:  None. FINDINGS: The brain demonstrates no evidence of hemorrhage, infarction, edema, mass effect, extra-axial fluid collection, hydrocephalus or mass lesion. The skull is unremarkable. IMPRESSION: Normal head CT. Electronically Signed   By: Aletta Edouard M.D.   On: 07/10/2015 13:49   I have personally reviewed and evaluated these images and lab results as part of my medical decision-making.   EKG Interpretation None      MDM   Final diagnoses:  Other migraine without status migrainosus, not intractable    39 yo F with a chief complaint of a headache. Patient has a history of migraines and this feels similar to the same. Neuro exam  grossly intact. We'll treat with migraine cocktail.  Patient continuing to have significant headache after initial migraine cocktail. Given magnesium with significant improvement of symptoms. CT the head was negative for acute pathology. This is within 6 hours feel it ruled out subarachnoid.  3:10 PM:  I have discussed the diagnosis/risks/treatment options with the patient and family and believe the pt to be eligible for discharge home to follow-up with PCP. We also discussed returning to the ED immediately if new or worsening sx occur. We discussed the sx which are most concerning (e.g., sudden worsening pain, fever, inability to tolerate by mouth) that necessitate immediate return. Medications administered to the patient during their visit and any new prescriptions provided to the patient are listed below.  Medications given during this visit Medications  prochlorperazine (COMPAZINE) injection 10 mg (10 mg Intravenous Given 07/10/15 1242)  diphenhydrAMINE (BENADRYL) injection 25 mg (25 mg Intravenous Given 07/10/15 1243)  sodium chloride 0.9 % bolus 1,000 mL (0 mLs Intravenous Stopped 07/10/15 1409)  ketorolac (TORADOL) 30 MG/ML injection 30 mg (30 mg Intravenous Given 07/10/15 1243)  magnesium sulfate IVPB 2 g 50 mL (0 g Intravenous Stopped 07/10/15 1346)  dexamethasone (DECADRON) injection 10 mg (10 mg Intravenous Given 07/10/15 1429)    New Prescriptions   No medications on file    The patient appears reasonably screen and/or stabilized for discharge and I doubt any other medical condition or other [facility] requiring further screening, evaluation, or treatment in the ED at this time prior to discharge.    Deno Etienne, DO 07/10/15 1510

## 2015-07-10 NOTE — ED Notes (Signed)
Resting quietly with eye closed. Easily arousable. Verbally responsive. Resp even and unlabored. ABC's intact. Family at bedside. IV infusing NS at 951ml/hr without difficulty.

## 2015-07-15 MED FILL — NYSTATIN/TRIAMCINOLONE CRM: 100000-0.1 | 20 days supply | Qty: 30 | Fill #0

## 2015-08-13 MED FILL — FLUCONAZOLE 150 MG TABLET: 150 | 1 days supply | Qty: 1 | Fill #2

## 2015-09-14 MED FILL — FLUCONAZOLE 150 MG TABLET: 150 | 1 days supply | Qty: 1 | Fill #0

## 2015-09-17 MED FILL — ALPRAZolam 1 MG TABS: 1 | 90 days supply | Qty: 360 | Fill #1

## 2015-09-21 ENCOUNTER — Encounter (HOSPITAL_BASED_OUTPATIENT_CLINIC_OR_DEPARTMENT_OTHER): Payer: Self-pay | Admitting: Emergency Medicine

## 2015-09-21 ENCOUNTER — Emergency Department (HOSPITAL_BASED_OUTPATIENT_CLINIC_OR_DEPARTMENT_OTHER)
Admission: EM | Admit: 2015-09-21 | Discharge: 2015-09-21 | Disposition: A | Discharge status: Home / Self Care | Payer: 59 | Attending: Emergency Medicine | Admitting: Emergency Medicine

## 2015-09-21 DIAGNOSIS — R630 Anorexia: Secondary | ICD-10-CM | POA: Diagnosis present

## 2015-09-21 DIAGNOSIS — F4321 Adjustment disorder with depressed mood: Secondary | ICD-10-CM | POA: Insufficient documentation

## 2015-09-21 DIAGNOSIS — Z87448 Personal history of other diseases of urinary system: Secondary | ICD-10-CM | POA: Insufficient documentation

## 2015-09-21 DIAGNOSIS — Z79899 Other long term (current) drug therapy: Secondary | ICD-10-CM | POA: Insufficient documentation

## 2015-09-21 DIAGNOSIS — Z91040 Latex allergy status: Secondary | ICD-10-CM | POA: Insufficient documentation

## 2015-09-21 DIAGNOSIS — K5900 Constipation, unspecified: Secondary | ICD-10-CM | POA: Diagnosis not present

## 2015-09-21 DIAGNOSIS — G4700 Insomnia, unspecified: Secondary | ICD-10-CM | POA: Insufficient documentation

## 2015-09-21 DIAGNOSIS — Z87442 Personal history of urinary calculi: Secondary | ICD-10-CM | POA: Diagnosis not present

## 2015-09-21 MED ORDER — ESCITALOPRAM OXALATE 10 MG PO TABS: 10.0000 mg | ORAL_TABLET | Freq: Every day | ORAL | Status: DC

## 2015-09-21 MED ORDER — RAMELTEON 8 MG PO TABS: 8.0000 mg | ORAL_TABLET | Freq: Every evening | ORAL | Status: DC | PRN

## 2015-09-21 MED ORDER — LORAZEPAM 2 MG/ML IJ SOLN
1.0000 mg | Freq: Once | INTRAMUSCULAR | Status: AC
Start: 2015-09-21 — End: 2015-09-21
  Administered 2015-09-21: 1 mg via INTRAMUSCULAR
  Filled 2015-09-21: qty 1

## 2015-09-21 NOTE — ED Provider Notes (Signed)
CSN: [id number]     Arrival date & time 09/21/15  2127 History   First MD Initiated Contact with Patient 09/21/15 2156     Chief Complaint  Patient presents with  . Depression     (Consider location/radiation/quality/duration/timing/severity/associated sxs/prior Treatment) HPI   Blood pressure 125/89, pulse 94, temperature 98.1 F (36.7 C), temperature source Oral, resp. rate 20, last menstrual period 09/21/2015, SpO2 99 %.  Misty [name] is a 40 y.o. female complaining of worsening depression over the last 3-4 weeks. Patient states that her teenage daughter is pregnant, states that she is stressed because she will have to carefully grandchild and figure out how to finances. Been increasingly depressed with lack of eating or sleeping in 2 days, states that she has been crying all day, cannot function cannot work. She denies suicidal ideation, homicidal ideation, auditory or visual hallucinations alcohol or drug use. Patient states that she is seen by a counselor and psychiatrist, she lost her psychiatrist roughly 6 months ago. She prescription for Adderall and Xanax when necessary. She attempted to contact her psychiatrist today but has not received a call back.  Past Medical History  Diagnosis Date  . Right ureteral stone   . History of kidney stones   . Frequency of urination   . Urgency of urination   . Nocturia   . Dysuria   . Hematuria   . Constipation    Past Surgical History  Procedure Laterality Date  . Extracorporeal shock wave lithotripsy  08-02-2009    RIGHT  . Cystolitholapaxy of large bladder calculi/   right ureteral stent exchange  04-15-2010  . Cysto w/ right ureteroscopy  2006  . Cysto/ right ureteral stent placement  07-29-2009    LARGE RIGHT RENAL CALCULUS  . Cystoscopy with stent placement  07/04/2012    Procedure: CYSTOSCOPY WITH STENT PLACEMENT;  Surgeon: Franchot Gallo, MD;  Location: [facility];  Service: Urology;  Laterality:  Right;  rt dbl j stent placement   . Cystoscopy/retrograde/ureteroscopy/stone extraction with basket  07/25/2012    Procedure: CYSTOSCOPY/RETROGRADE/URETEROSCOPY/STONE EXTRACTION WITH BASKET;  Surgeon: Franchot Gallo, MD;  Location: [facility];  Service: Urology;  Laterality: Right;  . Holmium laser application  [id number]    Procedure: HOLMIUM LASER APPLICATION;  Surgeon: Franchot Gallo, MD;  Location: [facility];  Service: Urology;  Laterality: Right;  . Laparoscopic tubal ligation Bilateral 01/17/2013    Procedure: LAPAROSCOPIC TUBAL LIGATION;  Surgeon: Melina Schools, MD;  Location: [facility];  Service: Gynecology;  Laterality: Bilateral;  1hr OR time    History reviewed. No pertinent family history. Social History  Substance Use Topics  . Smoking status: Never Smoker   . Smokeless tobacco: Never Used  . Alcohol Use: Yes     Comment: occ   OB History    Gravida Para Term Preterm AB TAB SAB Ectopic Multiple Living   7    2 1 1   5      Review of Systems  10 systems reviewed and found to be negative, except as noted in the HPI.   Allergies  Dilaudid; Ceftriaxone sodium; and Latex  Home Medications   Prior to Admission medications   Medication Sig Start Date End Date Taking? Authorizing Provider  amphetamine-dextroamphetamine (ADDERALL) 15 MG tablet Take 15 mg by mouth daily.   Yes Historical Provider, MD  ALPRAZolam Duanne Moron) 1 MG tablet Take 1 mg by mouth daily as needed for anxiety.     Historical Provider,  MD  aspirin-acetaminophen-caffeine (EXCEDRIN MIGRAINE) 250-250-65 MG tablet Take 1 tablet by mouth every 6 (six) hours as needed for headache.    Historical Provider, MD  dextroamphetamine (DEXEDRINE SPANSULE) 15 MG 24 hr capsule Take 1 capsule by mouth 3 (three) times daily. 06/29/15   Historical Provider, MD  escitalopram (LEXAPRO) 10 MG tablet Take 1 tablet (10 mg total) by mouth daily. 09/21/15   Ashlye Oviedo, PA-C  etonogestrel (NEXPLANON)  68 MG IMPL implant Inject 1 each into the skin once. Reported on 07/10/2015    Historical Provider, MD  gabapentin (NEURONTIN) 300 MG capsule Take 1 capsule (300 mg total) by mouth 3 (three) times daily. Patient not taking: Reported on 07/10/2015 01/29/13   Kristen N Ward, DO  ibuprofen (ADVIL,MOTRIN) 600 MG tablet Take 1 tablet (600 mg total) by mouth every 6 (six) hours as needed. Patient taking differently: Take 600 mg by mouth every 6 (six) hours as needed for headache or moderate pain.  06/04/13   Robyn Haber, MD  montelukast (SINGULAIR) 10 MG tablet Take 1 tablet by mouth daily. 06/28/15   Historical Provider, MD  naproxen sodium (ANAPROX) 220 MG tablet Take 220 mg by mouth daily as needed (for pain).     Historical Provider, MD  nystatin-triamcinolone (MYCOLOG II) cream Apply 1 application topically daily as needed. For skin irritation 07/06/15   Historical Provider, MD  oxyCODONE-acetaminophen (PERCOCET/ROXICET) 5-325 MG per tablet Take 2 tablets by mouth every 6 (six) hours as needed for pain. Patient not taking: Reported on 07/10/2015 01/29/13   Delice Bison Ward, DO  oxyCODONE-acetaminophen (ROXICET) 5-325 MG per tablet Take 1 tablet by mouth every 4 (four) hours as needed for pain. Patient not taking: Reported on 07/10/2015 01/17/13   Newton Pigg, MD  polyethylene glycol [facility]) packet Take 17 g by mouth daily. Patient not taking: Reported on 07/10/2015 01/19/13   Seabron Spates, CNM  ramelteon (ROZEREM) 8 MG tablet Take 1 tablet (8 mg total) by mouth at bedtime as needed for sleep. 09/21/15   Lashala Laser, PA-C   BP 125/89 mmHg  Pulse 94  Temp(Src) 98.1 F (36.7 C) (Oral)  Resp 20  SpO2 99%  LMP 09/21/2015 Physical Exam  Constitutional: She is oriented to person, place, and time. She appears well-developed and well-nourished.  HENT:  Head: Normocephalic.  Eyes: Conjunctivae and EOM are normal.  Cardiovascular: Normal rate.   Pulmonary/Chest: Effort normal. No stridor.   Musculoskeletal: Normal range of motion.  Neurological: She is alert and oriented to person, place, and time.  Tearful, appears tired  Psychiatric: She has a normal mood and affect.  Nursing note and vitals reviewed.   ED Course  Procedures (including critical care time) Labs Review Labs Reviewed - No data to display  Imaging Review No results found. I have personally reviewed and evaluated these images and lab results as part of my medical decision-making.   EKG Interpretation None      MDM   Final diagnoses:  Insomnia  Situational depression   Filed Vitals:   09/21/15 2138  BP: 125/89  Pulse: 94  Temp: 98.1 F (36.7 C)  TempSrc: Oral  Resp: 20  SpO2: 99%    Medications  LORazepam (ATIVAN) injection 1 mg (1 mg Intramuscular Given 09/21/15 2226)    JALYAH HESSLER is 40 y.o. female presenting with Increasing depression, lack of sleep. No suicidal ideation or homicidal ideation, I don't believe is an indication for admission at this time, patient feels comfortable going home. Will  start her on Lexapro, she understands that he will have to follow with her psychiatrist for definitive medication management. Patient is also given prescription for Rozerem to help her sleep. Patient given 1 mg of Ativan while in the ED.   Evaluation does not show pathology that would require ongoing emergent intervention or inpatient treatment. Pt is hemodynamically stable and mentating appropriately. Discussed findings and plan with patient/guardian, who agrees with care plan. All questions answered. Return precautions discussed and outpatient follow up given.   Discharge Medication List as of 09/21/2015 10:16 PM    START taking these medications   Details  escitalopram (LEXAPRO) 10 MG tablet Take 1 tablet (10 mg total) by mouth daily., Starting 09/21/2015, Until Discontinued, Print    ramelteon (ROZEREM) 8 MG tablet Take 1 tablet (8 mg total) by mouth at bedtime as needed for sleep.,  Starting 09/21/2015, Until Discontinued, Print             Monico Blitz, PA-C 09/21/15 Osmond Liu, MD 09/22/15 1228

## 2015-09-21 NOTE — Discharge Instructions (Signed)
Can start taking the new medications tomorrow. You've had your shot today. Please call your psychiatrist as soon as possible so they can continue to manage the antidepressive medications.     Do not hesitate to return to the emergency room for any new, worsening or concerning symptoms.Community Resource Guide Outpatient Counseling/Substance Abuse Adult The [employer] 211 is a great source of information about community services available.  Access by dialing 2-1-1 from anywhere in [place], or by website -  CustodianSupply.fi.   Other Local Resources (Updated 06/2015)  [facility]  Crisis Hotline, available 24 hours a day, 7 days a week: [place], [place]   [facility]  Crisis Hotline, available 24 hours a day, 7 days a week: [place], [place]  [facility]  Suicide Prevention Hotline, available 24 hours a day, 7 days a week: [place], [place], available 24 hours a day, 7 days a week: [place], [place] Access to [employer], available 24 hours a day, 7 days a week: [phone] All   [facility]  Crisis Hotline, available 24 hours a day, 7 days a week: 2257030578 All   Other Local Resources (Updated 06/2015)  Outpatient Counseling/ Substance Abuse Programs  Services     Address and Phone Number  ADS ([facility]cohol [facility] Drug Services)   Options include Individual counseling, group counseling, intensive outpatient program (several hours a day, several days a week)  Offers depression assessments  Provides methadone maintenance program [phone] [address] [place], [place] partial hospitalization/day treatment and DUI/DWI programs  [employer], private insurance [phone] [phone] [place], [place] [address]  [place] include intensive outpatient program (several hours a day, several days a week), outpatient treatment, DUI/DWI services, family education  Also has some services specifically for [employer] transitional housing  [phone] [address] [place], [place] [address]     [place] Medicare, private pay, and private insurance [phone] [address], [place], [place] [address]  [facility]  Services include individual counseling, substance abuse intensive outpatient program (several hours a day, several days a week), day treatment  Blinda [place]ather[place], Medicaid, private insurance [phone] [address], [place], [place] [address]  [facility]   Offers substance abuse intensive outpatient program (several hours a day, several days a week), partial hospitalization program [phone] [address] [place], [place] [address]  [phone] [address] [place], [place] [address]  [phone] [place], [place] [address]  [phone], [place], [place] [address]  [facility]  Individual counseling only  Accepts private insurance only [phone] [address], [place], [place] [address]  [facility]: Methadone Clinic  Methadone maintenance program [phone] [place], [place] [address]  [facility] providing substance abuse and mental health counseling  Accepts Medicaid, Medicare, private insurance  Offers sliding scale for uninsured [phone] [place], [place] in [place] individual counseling, and intensive in-home services [phone] [address], [place], [place] [address]  [facility]  individual counseling,  family counseling, group therapy, domestic violence counseling, consumer credit counseling  Accepts Medicare, Medicaid, private insurance  Offers sliding scale for uninsured [phone] [address], [place] [address]  [phone] [facility], [address] [place], [place]  [facility]  Offers individual, family and group counseling  3 locations - [place], [place], and [place]  [address] [place], [place] [address]  [address] [place], [place] [address]  [place], [place] [address]  [facility]    Offers psychiatric assessment, 8-week Intensive Outpatient Program (several hours a day, several times a week, daytime or evenings), early recovery group, family Program, medication management  Private pay or private insurance only [phone], or  [phone] [address] [place], [place] [address]  [facility]  Offers individual, couples and family counseling  Accepts Medicaid, private insurance, and sliding scale for uninsured [phone] [address] [place], [place] [address]  Launa Flight, MD  Individual counseling  Private insurance [phone] [place], [place] [address]  [facility]   Offers assessment, substance abuse treatment, and behavioral health treatment [phone] [address] [place], [place] [address]  [place]  Individual counseling  Accepts private insurance [phone] [place], [place] [address]  [facility]  Individual counseling  Blinda Leatherwood, private insurance [phone] [place], [place] [address]  [facility]    Offers intensive outpatient program (several hours a day, several times a week)  Private pay, private insurance [phone] [place], [place]  Individual counseling  Medicare, private insurance [phone] [address] [place], [place] [address]  [place]    Offers intensive outpatient program (several hours a day, several times a week) and partial hospitalization program [phone] [place], [place] [address]  Letta Moynahan, MD  Individual counseling [phone] [address], [place], [place] [address]  [place] counseling to individuals, couples, and families  Accepts Medicare and private insurance; offers sliding scale for uninsured [phone] [place], [place] [address]  [facility]  Christian counseling [phone] [address] [place], [place] [address]  [facility] [employer] crisis counseling, individual counseling, group therapy, in-home therapy, domestic violence services, day treatment, DWI services, Conservation officer, nature (CST), Assertive Community Treatment Team (ACTT), substance abuse Intensive Outpatient Program (several hours a day, several times a week)  2 locations - [place] and [place], [place] [address]  [phone] [address] [place], [place] [address]  [place] counseling and group therapy  [place] insurance, [place], [place] [phone] [address] [place], [place]  [facility]  Offers individual and family counseling  Offers LGBTQ services  Accepts private insurance and private pay [phone] [place], [place] [address]  [facility]    Offers individual counseling, group therapy, and outpatient detox  Accepts private insurance [phone] [place], [place] Medicare, private insurance [phone] [address], [address] [place], [place] [address]  [facility]  Accepts Medicare, private insurance [phone] [address] [place],  [address]  [facility] [address] substance abuse Intensive Outpatient Program (several hours  a day, several times a week) [phone], or [place], [place]

## 2015-09-21 NOTE — ED Notes (Addendum)
Patient states that she has been felling depressed for the last 3 -4 weeks and it has been getting worse. The patient reports that she has been crying all day for the last 2 days and has not eaten or slept in 2 days. The patient reports that she does not want to get up and go to work. Patient denies any Si/Hi

## 2015-09-22 MED FILL — ESCITALOPRAM 10 MG TABLET: 10 | 20 days supply | Qty: 20 | Fill #0

## 2015-09-22 MED FILL — traZODone HCL 50 MG TABS: 50 | 90 days supply | Qty: 270 | Fill #0

## 2015-09-23 ENCOUNTER — Telehealth (HOSPITAL_BASED_OUTPATIENT_CLINIC_OR_DEPARTMENT_OTHER): Payer: Self-pay | Admitting: Emergency Medicine

## 2015-10-12 MED FILL — NYSTATIN/TRIAMCINOLONE CRM: 100000-0.1 | 20 days supply | Qty: 30 | Fill #0

## 2015-10-12 MED FILL — FLUCONAZOLE 150 MG TABLET: 150 | 1 days supply | Qty: 1 | Fill #1

## 2015-10-12 MED FILL — metroNIDAZOLE 0.75 % GEL: 0.75 | 5 days supply | Qty: 70 | Fill #2

## 2015-11-18 MED FILL — D-AMPHETAMINE ER 15 MG CAP: 15 | 90 days supply | Qty: 270 | Fill #0

## 2015-11-19 MED FILL — FLUCONAZOLE 150 MG TABLET: 150 | 1 days supply | Qty: 1 | Fill #0

## 2015-11-24 DIAGNOSIS — N898 Other specified noninflammatory disorders of vagina: Secondary | ICD-10-CM | POA: Diagnosis not present

## 2015-11-24 DIAGNOSIS — R300 Dysuria: Secondary | ICD-10-CM | POA: Diagnosis not present

## 2015-11-24 MED FILL — metroNIDAZOLE 500 MG TABS: 500 | 7 days supply | Qty: 14 | Fill #0

## 2015-11-24 MED FILL — FLUCONAZOLE 150 MG TABLET: 150 | 1 days supply | Qty: 1 | Fill #0

## 2015-11-25 MED FILL — IBUPROFEN 600 MG TABLET: 600 | 5 days supply | Qty: 30 | Fill #2

## 2015-12-23 MED FILL — FLUCONAZOLE 150 MG TABLET: 150 | 1 days supply | Qty: 1 | Fill #1

## 2015-12-31 MED FILL — metroNIDAZOLE 500 MG TABS: 500 | 7 days supply | Qty: 14 | Fill #0

## 2016-01-13 DIAGNOSIS — F988 Other specified behavioral and emotional disorders with onset usually occurring in childhood and adolescence: Secondary | ICD-10-CM | POA: Diagnosis not present

## 2016-01-13 DIAGNOSIS — Z13220 Encounter for screening for lipoid disorders: Secondary | ICD-10-CM | POA: Diagnosis not present

## 2016-01-13 DIAGNOSIS — J3089 Other allergic rhinitis: Secondary | ICD-10-CM | POA: Diagnosis not present

## 2016-01-13 DIAGNOSIS — Z0000 Encounter for general adult medical examination without abnormal findings: Secondary | ICD-10-CM | POA: Diagnosis not present

## 2016-01-13 DIAGNOSIS — F419 Anxiety disorder, unspecified: Secondary | ICD-10-CM | POA: Diagnosis not present

## 2016-01-13 DIAGNOSIS — R5383 Other fatigue: Secondary | ICD-10-CM | POA: Diagnosis not present

## 2016-01-13 DIAGNOSIS — Z23 Encounter for immunization: Secondary | ICD-10-CM | POA: Diagnosis not present

## 2016-01-13 DIAGNOSIS — G43109 Migraine with aura, not intractable, without status migrainosus: Secondary | ICD-10-CM | POA: Diagnosis not present

## 2016-01-13 DIAGNOSIS — K219 Gastro-esophageal reflux disease without esophagitis: Secondary | ICD-10-CM | POA: Diagnosis not present

## 2016-01-13 MED FILL — SUMATRIPTAN SUCC 50 MG TAB: 50 | 30 days supply | Qty: 9 | Fill #0

## 2016-01-13 MED FILL — raNITIdine HCL 150 MG TABS: 150 | 30 days supply | Qty: 30 | Fill #0

## 2016-01-13 MED FILL — MONTELUKAST SOD 10 MG TAB: 10 | 30 days supply | Qty: 30 | Fill #0

## 2016-01-14 MED FILL — VIT D2 1.25 MG (50,000 UNIT: 1.25 MG | 84 days supply | Qty: 12 | Fill #0

## 2016-01-14 MED FILL — FLUCONAZOLE 150 MG TABLET: 150 | 1 days supply | Qty: 1 | Fill #2

## 2016-01-21 DIAGNOSIS — N946 Dysmenorrhea, unspecified: Secondary | ICD-10-CM | POA: Diagnosis not present

## 2016-01-21 DIAGNOSIS — Z01419 Encounter for gynecological examination (general) (routine) without abnormal findings: Secondary | ICD-10-CM | POA: Diagnosis not present

## 2016-01-21 DIAGNOSIS — Z124 Encounter for screening for malignant neoplasm of cervix: Secondary | ICD-10-CM | POA: Diagnosis not present

## 2016-01-21 DIAGNOSIS — Z130 Encounter for screening for diseases of the blood and blood-forming organs and certain disorders involving the immune mechanism: Secondary | ICD-10-CM | POA: Diagnosis not present

## 2016-01-21 DIAGNOSIS — N898 Other specified noninflammatory disorders of vagina: Secondary | ICD-10-CM | POA: Diagnosis not present

## 2016-01-21 DIAGNOSIS — Z1389 Encounter for screening for other disorder: Secondary | ICD-10-CM | POA: Diagnosis not present

## 2016-01-31 MED FILL — metroNIDAZOLE 0.75 % GEL: 0.75 | 5 days supply | Qty: 70 | Fill #0

## 2016-02-11 MED FILL — FLUCONAZOLE 150 MG TABLET: 150 | 1 days supply | Qty: 1 | Fill #1

## 2016-06-02 MED FILL — FLUCONAZOLE 150 MG TABLET: 150 | 5 days supply | Qty: 2 | Fill #0

## 2016-06-07 DIAGNOSIS — M546 Pain in thoracic spine: Secondary | ICD-10-CM | POA: Diagnosis not present

## 2016-06-07 DIAGNOSIS — J45990 Exercise induced bronchospasm: Secondary | ICD-10-CM | POA: Diagnosis not present

## 2016-06-07 DIAGNOSIS — R300 Dysuria: Secondary | ICD-10-CM | POA: Diagnosis not present

## 2016-06-07 DIAGNOSIS — D509 Iron deficiency anemia, unspecified: Secondary | ICD-10-CM | POA: Diagnosis not present

## 2016-06-07 DIAGNOSIS — E559 Vitamin D deficiency, unspecified: Secondary | ICD-10-CM | POA: Diagnosis not present

## 2016-06-07 MED FILL — FLUCONAZOLE 150 MG TABLET: 150 | 2 days supply | Qty: 2 | Fill #0

## 2016-06-07 MED FILL — CIPROFLOXACIN HCL 500 MG TA: 500 | 5 days supply | Qty: 10 | Fill #0

## 2016-06-22 MED FILL — VENTOLIN HFA 90 MCG INHALER: 108 (90 BAS | 16 days supply | Qty: 18 | Fill #0

## 2016-06-22 MED FILL — NYSTATIN-TRIAMCINOLONE CRM: 100000-0.1 | 14 days supply | Qty: 30 | Fill #0

## 2016-06-22 MED FILL — VIT D2 1.25 MG (50,000 UNIT: 1.25 MG | 84 days supply | Qty: 12 | Fill #0

## 2016-06-28 MED FILL — BIMATOPROST 0.03% EYELASH S: 0.03 | 30 days supply | Qty: 5 | Fill #0

## 2016-07-31 MED FILL — ALPRAZolam 1 MG TABS: 1 | 90 days supply | Qty: 360 | Fill #0

## 2016-07-31 MED FILL — D-AMPHETAMINE ER 15 MG CAP: 15 | 90 days supply | Qty: 270 | Fill #0

## 2016-08-18 DIAGNOSIS — R102 Pelvic and perineal pain: Secondary | ICD-10-CM | POA: Diagnosis not present

## 2016-08-18 DIAGNOSIS — Z113 Encounter for screening for infections with a predominantly sexual mode of transmission: Secondary | ICD-10-CM | POA: Diagnosis not present

## 2016-08-25 MED FILL — FLUCONAZOLE 150 MG TABLET: 150 | 3 days supply | Qty: 2 | Fill #0

## 2016-08-28 MED FILL — metroNIDAZOLE 500 MG TABS: 500 | 7 days supply | Qty: 14 | Fill #0

## 2016-10-07 DIAGNOSIS — S39012A Strain of muscle, fascia and tendon of lower back, initial encounter: Secondary | ICD-10-CM | POA: Diagnosis not present

## 2016-10-07 DIAGNOSIS — M545 Low back pain: Secondary | ICD-10-CM | POA: Diagnosis not present

## 2016-10-09 ENCOUNTER — Emergency Department (HOSPITAL_BASED_OUTPATIENT_CLINIC_OR_DEPARTMENT_OTHER)
Admission: EM | Admit: 2016-10-09 | Discharge: 2016-10-09 | Disposition: A | Discharge status: Home / Self Care | Payer: 59 | Attending: Emergency Medicine | Admitting: Emergency Medicine

## 2016-10-09 ENCOUNTER — Encounter (HOSPITAL_BASED_OUTPATIENT_CLINIC_OR_DEPARTMENT_OTHER): Payer: Self-pay

## 2016-10-09 ENCOUNTER — Emergency Department (HOSPITAL_BASED_OUTPATIENT_CLINIC_OR_DEPARTMENT_OTHER): Payer: 59

## 2016-10-09 DIAGNOSIS — K439 Ventral hernia without obstruction or gangrene: Secondary | ICD-10-CM | POA: Diagnosis not present

## 2016-10-09 DIAGNOSIS — N946 Dysmenorrhea, unspecified: Secondary | ICD-10-CM | POA: Diagnosis not present

## 2016-10-09 DIAGNOSIS — R110 Nausea: Secondary | ICD-10-CM | POA: Insufficient documentation

## 2016-10-09 DIAGNOSIS — R1031 Right lower quadrant pain: Secondary | ICD-10-CM | POA: Insufficient documentation

## 2016-10-09 DIAGNOSIS — R109 Unspecified abdominal pain: Secondary | ICD-10-CM

## 2016-10-09 DIAGNOSIS — R630 Anorexia: Secondary | ICD-10-CM | POA: Diagnosis not present

## 2016-10-09 LAB — PREGNANCY, URINE: Preg Test, Ur: NEGATIVE

## 2016-10-09 LAB — COMPREHENSIVE METABOLIC PANEL
ALT: 23 U/L (ref 14–54)
AST: 21 U/L (ref 15–41)
Albumin: 3.6 g/dL (ref 3.5–5.0)
Alkaline Phosphatase: 52 U/L (ref 38–126)
Anion gap: 7 (ref 5–15)
BUN: 9 mg/dL (ref 6–20)
CO2: 26 mmol/L (ref 22–32)
Calcium: 10 mg/dL (ref 8.9–10.3)
Chloride: 105 mmol/L (ref 101–111)
Creatinine, Ser: 0.84 mg/dL (ref 0.44–1.00)
GFR calc Af Amer: >60 mL/min (ref >60)
GFR calc non Af Amer: >60 mL/min (ref >60)
Glucose, Bld: 96 mg/dL (ref 65–99)
Potassium: 4 mmol/L (ref 3.5–5.1)
Sodium: 138 mmol/L (ref 135–145)
Total Bilirubin: 0.5 mg/dL (ref 0.3–1.2)
Total Protein: 6.9 g/dL (ref 6.5–8.1)

## 2016-10-09 LAB — CBC WITH DIFFERENTIAL/PLATELET
Basophils Absolute: 0 10*3/uL (ref 0.0–0.1)
Basophils Relative: 0 %
Eosinophils Absolute: 0.1 10*3/uL (ref 0.0–0.7)
Eosinophils Relative: 2 %
HCT: 38.9 % (ref 36.0–46.0)
Hemoglobin: 12.6 g/dL (ref 12.0–15.0)
Lymphocytes Relative: 51 %
Lymphs Abs: 2.6 10*3/uL (ref 0.7–4.0)
MCH: 25.4 pg — ABNORMAL LOW (ref 26.0–34.0)
MCHC: 32.4 g/dL (ref 30.0–36.0)
MCV: 78.3 fL (ref 78.0–100.0)
Monocytes Absolute: 0.5 10*3/uL (ref 0.1–1.0)
Monocytes Relative: 10 %
Neutro Abs: 1.9 10*3/uL (ref 1.7–7.7)
Neutrophils Relative %: 37 %
Platelets: 302 10*3/uL (ref 150–400)
RBC: 4.97 MIL/uL (ref 3.87–5.11)
RDW: 17.5 % — ABNORMAL HIGH (ref 11.5–15.5)
WBC: 5.2 10*3/uL (ref 4.0–10.5)

## 2016-10-09 LAB — URINALYSIS, ROUTINE W REFLEX MICROSCOPIC
Bilirubin Urine: NEGATIVE
Glucose, UA: NEGATIVE mg/dL
Hgb urine dipstick: NEGATIVE
Ketones, ur: NEGATIVE mg/dL
Leukocytes, UA: NEGATIVE
Nitrite: NEGATIVE
Protein, ur: NEGATIVE mg/dL
Specific Gravity, Urine: 1.017 (ref 1.005–1.030)
pH: 6 (ref 5.0–8.0)

## 2016-10-09 MED ORDER — IBUPROFEN 400 MG PO TABS
400.0000 mg | ORAL_TABLET | Freq: Once | ORAL | Status: AC
  Administered 2016-10-09: 400 mg via ORAL
  Filled 2016-10-09: qty 1

## 2016-10-09 MED ORDER — IOPAMIDOL (ISOVUE-300) INJECTION 61%
100.0000 mL | Freq: Once | INTRAVENOUS | Status: AC | PRN
Start: 2016-10-09 — End: 2016-10-09
  Administered 2016-10-09: 100 mL via INTRAVENOUS

## 2016-10-09 NOTE — ED Triage Notes (Signed)
PT reports one week history of lower back pain, alleviated by lying on the left side. States seen at clinic on Saturday and given tramadol, but that only makes her sleepy, and does not alleviate the pain. Denies known injury.

## 2016-10-09 NOTE — ED Provider Notes (Addendum)
[place] DEPT [facility] Provider Note   CSN: [id number] Arrival date & time: 10/09/16  4008     History   Chief Complaint Chief Complaint  Patient presents with  . Back Pain    HPI Misty [name] is a 41 y.o. female.Complains of right flank pain radiating to right lower quadrant onset one week ago, gradually becoming worse 2 days ago. Associated symptoms include diminished appetite, slight nausea no vomiting no fever. Last normal menstrual period 2 weeks ago. Last bowel movement 2 days ago, normal. Pain remotely feels like kidney stone she's had in the past pain is worse with changing positions improved with lying on her left side. She saw a physician 2 days ago at an [place] clinic prescribed tramadol and cyclobenzaprine, which she's taken without relief. No other associated symptoms  HPI  Past Medical History:  Diagnosis Date  . Constipation   . Dysuria   . Frequency of urination   . Hematuria   . History of kidney stones   . Nocturia   . Right ureteral stone   . Urgency of urination     Patient Active Problem List   Diagnosis Date Noted  . UTI 04/10/2007  . ALLERGIC RHINITIS 10/30/2006  . NEPHROLITHIASIS, HX OF 10/30/2006    Past Surgical History:  Procedure Laterality Date  . CYSTO W/ RIGHT URETEROSCOPY  2006  . CYSTO/ RIGHT URETERAL STENT PLACEMENT  07-29-2009   LARGE RIGHT RENAL CALCULUS  . CYSTOLITHOLAPAXY OF LARGE BLADDER CALCULI/   RIGHT URETERAL STENT EXCHANGE  04-15-2010  . CYSTOSCOPY WITH STENT PLACEMENT  07/04/2012   Procedure: CYSTOSCOPY WITH STENT PLACEMENT;  Surgeon: Franchot Gallo, MD;  Location: [facility];  Service: Urology;  Laterality: Right;  rt dbl j stent placement   . CYSTOSCOPY/RETROGRADE/URETEROSCOPY/STONE EXTRACTION WITH BASKET  07/25/2012   Procedure: CYSTOSCOPY/RETROGRADE/URETEROSCOPY/STONE EXTRACTION WITH BASKET;  Surgeon: Franchot Gallo, MD;  Location: [facility];  Service: Urology;  Laterality:  Right;  . EXTRACORPOREAL SHOCK WAVE LITHOTRIPSY  08-02-2009   RIGHT  . HOLMIUM LASER APPLICATION  11/23/6193   Procedure: HOLMIUM LASER APPLICATION;  Surgeon: Franchot Gallo, MD;  Location: [facility];  Service: Urology;  Laterality: Right;  . LAPAROSCOPIC TUBAL LIGATION Bilateral 01/17/2013   Procedure: LAPAROSCOPIC TUBAL LIGATION;  Surgeon: Melina Schools, MD;  Location: [facility];  Service: Gynecology;  Laterality: Bilateral;  1hr OR time     OB History    Gravida Para Term Preterm AB Living   7       2 5    SAB TAB Ectopic Multiple Live Births   1 1     5        Home Medications    Prior to Admission medications   Medication Sig Start Date End Date Taking? Authorizing Provider  ALPRAZolam Duanne Moron) 1 MG tablet Take 1 mg by mouth daily as needed for anxiety.    Yes Historical Provider, MD  amphetamine-dextroamphetamine (ADDERALL) 15 MG tablet Take 15 mg by mouth daily.   Yes Historical Provider, MD  Cyclobenzaprine HCl (FLEXERIL PO) Take by mouth.   Yes Historical Provider, MD  TRAMADOL HCL ER PO Take by mouth.   Yes Historical Provider, MD  aspirin-acetaminophen-caffeine (EXCEDRIN MIGRAINE) [phone] MG tablet Take 1 tablet by mouth every 6 (six) hours as needed for headache.    Historical Provider, MD  dextroamphetamine (DEXEDRINE SPANSULE) 15 MG 24 hr capsule Take 1 capsule by mouth 3 (three) times daily. 06/29/15   Historical Provider, MD  escitalopram (  LEXAPRO) 10 MG tablet Take 1 tablet (10 mg total) by mouth daily. 09/21/15   Nicole Pisciotta, PA-C  etonogestrel (NEXPLANON) 68 MG IMPL implant Inject 1 each into the skin once. Reported on 07/10/2015    Historical Provider, MD  gabapentin (NEURONTIN) 300 MG capsule Take 1 capsule (300 mg total) by mouth 3 (three) times daily. Patient not taking: Reported on 07/10/2015 01/29/13   Kristen N Ward, DO  ibuprofen (ADVIL,MOTRIN) 600 MG tablet Take 1 tablet (600 mg total) by mouth every 6 (six) hours as needed. Patient taking  differently: Take 600 mg by mouth every 6 (six) hours as needed for headache or moderate pain.  06/04/13   Robyn Haber, MD  montelukast (SINGULAIR) 10 MG tablet Take 1 tablet by mouth daily. 06/28/15   Historical Provider, MD  naproxen sodium (ANAPROX) 220 MG tablet Take 220 mg by mouth daily as needed (for pain).     Historical Provider, MD  nystatin-triamcinolone (MYCOLOG II) cream Apply 1 application topically daily as needed. For skin irritation 07/06/15   Historical Provider, MD  oxyCODONE-acetaminophen (PERCOCET/ROXICET) 5-325 MG per tablet Take 2 tablets by mouth every 6 (six) hours as needed for pain. Patient not taking: Reported on 07/10/2015 01/29/13   Delice Bison Ward, DO  oxyCODONE-acetaminophen (ROXICET) 5-325 MG per tablet Take 1 tablet by mouth every 4 (four) hours as needed for pain. Patient not taking: Reported on 07/10/2015 01/17/13   Newton Pigg, MD  polyethylene glycol [facility]) packet Take 17 g by mouth daily. Patient not taking: Reported on 07/10/2015 01/19/13   Seabron Spates, CNM  ramelteon (ROZEREM) 8 MG tablet Take 1 tablet (8 mg total) by mouth at bedtime as needed for sleep. 09/21/15   Elmyra Ricks Pisciotta, PA-C    Family History History reviewed. No pertinent family history.  Social History Social History  Substance Use Topics  . Smoking status: Never Smoker  . Smokeless tobacco: Never Used  . Alcohol use Yes     Comment: occ   No illicit drug use  Allergies   Dilaudid [hydromorphone hcl]; Ceftriaxone sodium; and Latex   Review of Systems Review of Systems  Constitutional: Negative.   HENT: Negative.   Respiratory: Negative.   Cardiovascular: Negative.   Gastrointestinal: Positive for abdominal pain.  Genitourinary: Positive for flank pain.  Skin: Negative.   Neurological: Negative.   Psychiatric/Behavioral: Negative.   All other systems reviewed and are negative.    Physical Exam Updated Vital Signs BP (!) 127/91 (BP Location: Left Arm)   Pulse 80    Temp 98.9 F (37.2 C) (Oral)   Resp 18   Ht 5\' 9"  (1.753 m)   Wt 210 lb (95.3 kg)   LMP 09/25/2016   SpO2 100%   BMI 31.01 kg/m   Physical Exam  Constitutional: She appears well-developed and well-nourished.  HENT:  Head: Normocephalic and atraumatic.  Eyes: Conjunctivae are normal. Pupils are equal, round, and reactive to light.  Neck: Neck supple. No tracheal deviation present. No thyromegaly present.  Cardiovascular: Normal rate and regular rhythm.   No murmur heard. Pulmonary/Chest: Effort normal and breath sounds normal.  Abdominal: Soft. Bowel sounds are normal. She exhibits no distension. There is tenderness.  Minimally tender right lower quadrant  Genitourinary:  Genitourinary Comments: Right flank tenderness  Musculoskeletal: Normal range of motion. She exhibits no edema or tenderness.  Neurological: She is alert. Coordination normal.  Skin: Skin is warm and dry. No rash noted.  Psychiatric: She has a normal mood and affect.  Nursing note and vitals reviewed.    ED Treatments / Results  Labs (all labs ordered are listed, but only abnormal results are displayed) Labs Reviewed  URINALYSIS, ROUTINE W REFLEX MICROSCOPIC  PREGNANCY, URINE  COMPREHENSIVE METABOLIC PANEL  CBC WITH DIFFERENTIAL/PLATELET    EKG  EKG Interpretation None       Radiology No results found.  Procedures Procedures (including critical care time)  Medications Ordered in ED Medications - No data to display Results for orders placed or performed during the hospital encounter of 10/09/16  Urinalysis, Routine w reflex microscopic  Result Value Ref Range   Color, Urine YELLOW YELLOW   APPearance CLOUDY (A) CLEAR   Specific Gravity, Urine 1.017 1.005 - 1.030   pH 6.0 5.0 - 8.0   Glucose, UA NEGATIVE NEGATIVE mg/dL   Hgb urine dipstick NEGATIVE NEGATIVE   Bilirubin Urine NEGATIVE NEGATIVE   Ketones, ur NEGATIVE NEGATIVE mg/dL   Protein, ur NEGATIVE NEGATIVE mg/dL   Nitrite  NEGATIVE NEGATIVE   Leukocytes, UA NEGATIVE NEGATIVE  Pregnancy, urine  Result Value Ref Range   Preg Test, Ur NEGATIVE NEGATIVE  Comprehensive metabolic panel  Result Value Ref Range   Sodium 138 135 - 145 mmol/L   Potassium 4.0 3.5 - 5.1 mmol/L   Chloride 105 101 - 111 mmol/L   CO2 26 22 - 32 mmol/L   Glucose, Bld 96 65 - 99 mg/dL   BUN 9 6 - 20 mg/dL   Creatinine, Ser 0.84 0.44 - 1.00 mg/dL   Calcium 10.0 8.9 - 10.3 mg/dL   Total Protein 6.9 6.5 - 8.1 g/dL   Albumin 3.6 3.5 - 5.0 g/dL   AST 21 15 - 41 U/L   ALT 23 14 - 54 U/L   Alkaline Phosphatase 52 38 - 126 U/L   Total Bilirubin 0.5 0.3 - 1.2 mg/dL   GFR calc non Af Amer >60 >60 mL/min   GFR calc Af Amer >60 >60 mL/min   Anion gap 7 5 - 15  CBC with Differential/Platelet  Result Value Ref Range   WBC 5.2 4.0 - 10.5 K/uL   RBC 4.97 3.87 - 5.11 MIL/uL   Hemoglobin 12.6 12.0 - 15.0 g/dL   HCT 38.9 36.0 - 46.0 %   MCV 78.3 78.0 - 100.0 fL   MCH 25.4 (L) 26.0 - 34.0 pg   MCHC 32.4 30.0 - 36.0 g/dL   RDW 17.5 (H) 11.5 - 15.5 %   Platelets 302 150 - 400 K/uL   Neutrophils Relative % 37 %   Neutro Abs 1.9 1.7 - 7.7 K/uL   Lymphocytes Relative 51 %   Lymphs Abs 2.6 0.7 - 4.0 K/uL   Monocytes Relative 10 %   Monocytes Absolute 0.5 0.1 - 1.0 K/uL   Eosinophils Relative 2 %   Eosinophils Absolute 0.1 0.0 - 0.7 K/uL   Basophils Relative 0 %   Basophils Absolute 0.0 0.0 - 0.1 K/uL   Ct Abdomen Pelvis W Contrast  Result Date: 10/09/2016 CLINICAL DATA:  One week history of right flank pain EXAM: CT ABDOMEN AND PELVIS WITH CONTRAST TECHNIQUE: Multidetector CT imaging of the abdomen and pelvis was performed using the standard protocol following bolus administration of intravenous contrast. Oral contrast was also administered. CONTRAST:  187mL ISOVUE-300 IOPAMIDOL (ISOVUE-300) INJECTION 61% COMPARISON:  July 04, 2012 FINDINGS: Lower chest: Lung bases are clear. Hepatobiliary: No focal liver lesions are evident. Gallbladder wall  is not appreciably thickened. There is no biliary duct dilatation. Pancreas: No  pancreatic mass or inflammatory focus. Spleen: No splenic lesions are evident. Adrenals/Urinary Tract: Adrenals appear normal bilaterally. There is a cyst arising from the lateral mid left kidney measuring 1.5 x 1.6 cm. There is a cyst in the medial mid right kidney measuring 1.3 x 1.3 cm. 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 There is no hydronephrosis on either side. There is no appreciable intrarenal or ureteral calculus on either side. Urinary bladder is midline with wall thickness within normal limits. Stomach/Bowel: There is no appreciable bowel wall or mesenteric thickening. There is no appreciable bowel obstruction. No free air or portal venous air. Vascular/Lymphatic: There is no abdominal aortic aneurysm. No vascular lesions are evident. There is no appreciable adenopathy in the abdomen or pelvis. Reproductive: The uterus is anteverted. Uterus appears mildly inhomogeneous and attenuation. A dominant mass is not appreciable in the uterus. There is no pelvic mass elsewhere. Other: Appendix appears normal. There is no ascites or abscess in the abdomen or pelvis. There is a small ventral hernia containing only fat. Musculoskeletal: There are no blastic or lytic bone lesions. No intramuscular or abdominal wall lesion apparent. IMPRESSION: Uterus appears mildly inhomogeneous. There may be leiomyomatous change within the uterus, but no dominant masses evident. No renal or ureteral calculus.  No hydronephrosis. Small ventral hernia containing only fat. No bowel obstruction.  No abscess.  Appendix appears normal. Electronically Signed   By: Lowella Grip III M.D.   On: 10/09/2016 07:59    Initial Impression / Assessment and Plan / ED Course  I have reviewed the triage vital signs and the nursing notes.  Pertinent labs & imaging results that were available during my  care of the patient were reviewed by me and considered in my medical decision making (see chart for details).     No definite etiology of symptoms. I advised patient to take ibuprofen as directed is ibuprofen has helped her in the past for pain. She'll be given a dose prior to discharge. 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

## 2016-10-09 NOTE — Discharge Instructions (Signed)
Take Advil or ibuprofen as directed for pain. Follow-up with your primary care physician if not better by next week.

## 2016-10-11 MED FILL — MONTELUKAST SOD 10 MG TAB: 10 | 30 days supply | Qty: 30 | Fill #1

## 2016-10-27 MED FILL — ALPRAZolam 1 MG TABS: 1 | 90 days supply | Qty: 360 | Fill #1

## 2016-11-10 MED FILL — FLUCONAZOLE 150 MG TABLET: 150 | 1 days supply | Qty: 1 | Fill #0

## 2016-11-10 MED FILL — TERCONAZOLE 0.4% VAG CREAM: 0.4 | 7 days supply | Qty: 45 | Fill #0

## 2016-11-14 DIAGNOSIS — N760 Acute vaginitis: Secondary | ICD-10-CM | POA: Diagnosis not present

## 2016-11-14 DIAGNOSIS — N898 Other specified noninflammatory disorders of vagina: Secondary | ICD-10-CM | POA: Diagnosis not present

## 2016-11-14 MED FILL — metroNIDAZOLE 500 MG TABS: 500 | 7 days supply | Qty: 14 | Fill #0

## 2016-12-06 MED FILL — FLUCONAZOLE 150 MG TABLET: 150 | 1 days supply | Qty: 1 | Fill #1

## 2016-12-12 MED FILL — D-AMPHETAMINE ER 15 MG CAP: 15 | 90 days supply | Qty: 360 | Fill #0

## 2016-12-13 DIAGNOSIS — N920 Excessive and frequent menstruation with regular cycle: Secondary | ICD-10-CM | POA: Diagnosis not present

## 2016-12-13 MED FILL — IBUPROFEN 600 MG TABLET: 600 | 3 days supply | Qty: 12 | Fill #0

## 2016-12-13 MED FILL — MEDROXYPROGESTERONE 10 MG T: 10 | 33 days supply | Qty: 40 | Fill #0

## 2017-01-24 MED FILL — FLUCONAZOLE 150 MG TABLET: 150 | 3 days supply | Qty: 2 | Fill #0

## 2017-02-06 MED FILL — ALPRAZolam 1 MG TABS: 1 | 30 days supply | Qty: 120 | Fill #0

## 2017-02-27 MED FILL — NYSTATIN-TRIAMCINOLONE CRM: 100000-0.1 | 14 days supply | Qty: 30 | Fill #1

## 2017-03-23 MED FILL — FLUCONAZOLE 150 MG TABLET: 150 | 3 days supply | Qty: 2 | Fill #0

## 2017-04-06 MED FILL — metroNIDAZOLE 500 MG TABS: 500 | 7 days supply | Qty: 14 | Fill #0

## 2017-04-12 DIAGNOSIS — Z111 Encounter for screening for respiratory tuberculosis: Secondary | ICD-10-CM | POA: Diagnosis not present

## 2017-04-12 DIAGNOSIS — E559 Vitamin D deficiency, unspecified: Secondary | ICD-10-CM | POA: Diagnosis not present

## 2017-05-25 DIAGNOSIS — J069 Acute upper respiratory infection, unspecified: Secondary | ICD-10-CM | POA: Diagnosis not present

## 2017-06-19 DIAGNOSIS — E210 Primary hyperparathyroidism: Secondary | ICD-10-CM

## 2017-06-19 HISTORY — DX: Primary hyperparathyroidism: E21.0

## 2017-07-02 MED FILL — D-AMPHETAMINE ER 15 MG CAP: 15 | 90 days supply | Qty: 360 | Fill #0

## 2017-07-02 MED FILL — CITALOPRAM HBR 20 MG TABLET: 20 | 90 days supply | Qty: 90 | Fill #0

## 2017-07-02 MED FILL — ALPRAZolam 1 MG TABS: 1 | 90 days supply | Qty: 360 | Fill #0

## 2017-07-03 DIAGNOSIS — J309 Allergic rhinitis, unspecified: Secondary | ICD-10-CM | POA: Diagnosis not present

## 2017-07-03 DIAGNOSIS — E7800 Pure hypercholesterolemia, unspecified: Secondary | ICD-10-CM | POA: Diagnosis not present

## 2017-07-03 DIAGNOSIS — R319 Hematuria, unspecified: Secondary | ICD-10-CM | POA: Diagnosis not present

## 2017-07-03 DIAGNOSIS — E559 Vitamin D deficiency, unspecified: Secondary | ICD-10-CM | POA: Diagnosis not present

## 2017-07-03 DIAGNOSIS — G43109 Migraine with aura, not intractable, without status migrainosus: Secondary | ICD-10-CM | POA: Diagnosis not present

## 2017-07-03 DIAGNOSIS — D509 Iron deficiency anemia, unspecified: Secondary | ICD-10-CM | POA: Diagnosis not present

## 2017-07-03 DIAGNOSIS — F419 Anxiety disorder, unspecified: Secondary | ICD-10-CM | POA: Diagnosis not present

## 2017-07-03 DIAGNOSIS — J4520 Mild intermittent asthma, uncomplicated: Secondary | ICD-10-CM | POA: Diagnosis not present

## 2017-07-03 DIAGNOSIS — Z0000 Encounter for general adult medical examination without abnormal findings: Secondary | ICD-10-CM | POA: Diagnosis not present

## 2017-07-03 DIAGNOSIS — F909 Attention-deficit hyperactivity disorder, unspecified type: Secondary | ICD-10-CM | POA: Diagnosis not present

## 2017-07-03 MED FILL — VENTOLIN HFA 90 MCG INHALER: 108 (90 BAS | 16 days supply | Qty: 18 | Fill #0

## 2017-07-03 MED FILL — FLUCONAZOLE 150 MG TABLET: 150 | 2 days supply | Qty: 2 | Fill #0

## 2017-07-03 MED FILL — MONTELUKAST SOD 10 MG TAB: 10 | 90 days supply | Qty: 90 | Fill #0

## 2017-07-03 MED FILL — metroNIDAZOLE 500 MG TABS: 500 | 7 days supply | Qty: 14 | Fill #0

## 2017-07-26 DIAGNOSIS — R3121 Asymptomatic microscopic hematuria: Secondary | ICD-10-CM | POA: Diagnosis not present

## 2017-07-26 DIAGNOSIS — R1084 Generalized abdominal pain: Secondary | ICD-10-CM | POA: Diagnosis not present

## 2017-07-26 MED FILL — ONDANSETRON HCL 4 MG TABLET: 4 | 10 days supply | Qty: 30 | Fill #0

## 2017-07-26 MED FILL — DICYCLOMINE 20 MG TABLET: 20 | 10 days supply | Qty: 30 | Fill #0

## 2017-08-01 ENCOUNTER — Other Ambulatory Visit: Payer: Self-pay | Admitting: Obstetrics and Gynecology

## 2017-08-01 DIAGNOSIS — Z139 Encounter for screening, unspecified: Secondary | ICD-10-CM

## 2017-08-01 MED FILL — VITAMIN D3 50000 UNIT CAPS: 1.25 MG | 84 days supply | Qty: 12 | Fill #0

## 2017-08-01 MED FILL — FERREX 150 CAPSULE: 150 | 30 days supply | Qty: 30 | Fill #0

## 2017-08-07 DIAGNOSIS — M7541 Impingement syndrome of right shoulder: Secondary | ICD-10-CM | POA: Diagnosis not present

## 2017-08-07 DIAGNOSIS — M79601 Pain in right arm: Secondary | ICD-10-CM | POA: Diagnosis not present

## 2017-08-07 DIAGNOSIS — M5030 Other cervical disc degeneration, unspecified cervical region: Secondary | ICD-10-CM | POA: Diagnosis not present

## 2017-08-20 DIAGNOSIS — M79601 Pain in right arm: Secondary | ICD-10-CM | POA: Diagnosis not present

## 2017-08-24 ENCOUNTER — Ambulatory Visit: Payer: 59

## 2017-08-29 DIAGNOSIS — E559 Vitamin D deficiency, unspecified: Secondary | ICD-10-CM | POA: Diagnosis not present

## 2017-08-29 DIAGNOSIS — E210 Primary hyperparathyroidism: Secondary | ICD-10-CM | POA: Diagnosis not present

## 2017-09-28 DIAGNOSIS — L293 Anogenital pruritus, unspecified: Secondary | ICD-10-CM | POA: Diagnosis not present

## 2017-09-28 DIAGNOSIS — L292 Pruritus vulvae: Secondary | ICD-10-CM | POA: Diagnosis not present

## 2017-09-28 MED FILL — TRIAMCINOLONE ACETONIDE 0.1: 0.1 | 10 days supply | Qty: 30 | Fill #0

## 2017-09-28 MED FILL — ALPRAZolam 1 MG TABS: 1 | 90 days supply | Qty: 360 | Fill #1

## 2017-09-28 MED FILL — NYSTATIN 100,000 UNIT/GM CR: 100000 | 10 days supply | Qty: 15 | Fill #0

## 2017-09-30 MED FILL — D-AMPHETAMINE ER 15 MG CAP: 15 | 90 days supply | Qty: 360 | Fill #0

## 2017-10-18 ENCOUNTER — Emergency Department (HOSPITAL_BASED_OUTPATIENT_CLINIC_OR_DEPARTMENT_OTHER): Payer: 59

## 2017-10-18 ENCOUNTER — Emergency Department (HOSPITAL_BASED_OUTPATIENT_CLINIC_OR_DEPARTMENT_OTHER)
Admission: EM | Admit: 2017-10-18 | Discharge: 2017-10-18 | Disposition: A | Discharge status: Home / Self Care | Payer: 59 | Attending: Emergency Medicine | Admitting: Emergency Medicine

## 2017-10-18 ENCOUNTER — Other Ambulatory Visit: Payer: Self-pay

## 2017-10-18 ENCOUNTER — Encounter (HOSPITAL_BASED_OUTPATIENT_CLINIC_OR_DEPARTMENT_OTHER): Payer: Self-pay | Admitting: *Deleted

## 2017-10-18 DIAGNOSIS — Z79899 Other long term (current) drug therapy: Secondary | ICD-10-CM | POA: Diagnosis not present

## 2017-10-18 DIAGNOSIS — R109 Unspecified abdominal pain: Secondary | ICD-10-CM | POA: Diagnosis present

## 2017-10-18 DIAGNOSIS — R1011 Right upper quadrant pain: Secondary | ICD-10-CM | POA: Diagnosis not present

## 2017-10-18 DIAGNOSIS — N200 Calculus of kidney: Secondary | ICD-10-CM | POA: Diagnosis not present

## 2017-10-18 DIAGNOSIS — R1084 Generalized abdominal pain: Secondary | ICD-10-CM | POA: Diagnosis not present

## 2017-10-18 DIAGNOSIS — R1012 Left upper quadrant pain: Secondary | ICD-10-CM | POA: Diagnosis not present

## 2017-10-18 DIAGNOSIS — R1110 Vomiting, unspecified: Secondary | ICD-10-CM | POA: Diagnosis not present

## 2017-10-18 DIAGNOSIS — J45909 Unspecified asthma, uncomplicated: Secondary | ICD-10-CM | POA: Insufficient documentation

## 2017-10-18 DIAGNOSIS — R112 Nausea with vomiting, unspecified: Secondary | ICD-10-CM | POA: Diagnosis not present

## 2017-10-18 HISTORY — DX: Unspecified asthma, uncomplicated: J45.909

## 2017-10-18 LAB — COMPREHENSIVE METABOLIC PANEL
ALT: 38 U/L (ref 14–54)
AST: 32 U/L (ref 15–41)
Albumin: 4.2 g/dL (ref 3.5–5.0)
Alkaline Phosphatase: 60 U/L (ref 38–126)
Anion gap: 9 (ref 5–15)
BUN: 12 mg/dL (ref 6–20)
CO2: 23 mmol/L (ref 22–32)
Calcium: 10 mg/dL (ref 8.9–10.3)
Chloride: 104 mmol/L (ref 101–111)
Creatinine, Ser: 0.84 mg/dL (ref 0.44–1.00)
GFR calc Af Amer: >60 mL/min (ref >60)
GFR calc non Af Amer: >60 mL/min (ref >60)
Glucose, Bld: 103 mg/dL — ABNORMAL HIGH (ref 65–99)
Potassium: 3.9 mmol/L (ref 3.5–5.1)
Sodium: 136 mmol/L (ref 135–145)
Total Bilirubin: 0.7 mg/dL (ref 0.3–1.2)
Total Protein: 7.6 g/dL (ref 6.5–8.1)

## 2017-10-18 LAB — CBC
HCT: 42.3 % (ref 36.0–46.0)
Hemoglobin: 14.1 g/dL (ref 12.0–15.0)
MCH: 25.7 pg — ABNORMAL LOW (ref 26.0–34.0)
MCHC: 33.3 g/dL (ref 30.0–36.0)
MCV: 77 fL — ABNORMAL LOW (ref 78.0–100.0)
Platelets: 269 10*3/uL (ref 150–400)
RBC: 5.49 MIL/uL — ABNORMAL HIGH (ref 3.87–5.11)
RDW: 17.2 % — ABNORMAL HIGH (ref 11.5–15.5)
WBC: 7.9 10*3/uL (ref 4.0–10.5)

## 2017-10-18 LAB — LIPASE, BLOOD: Lipase: 26 U/L (ref 11–51)

## 2017-10-18 LAB — PREGNANCY, URINE: Preg Test, Ur: NEGATIVE

## 2017-10-18 LAB — URINALYSIS, ROUTINE W REFLEX MICROSCOPIC
Bilirubin Urine: NEGATIVE
Glucose, UA: NEGATIVE mg/dL
Hgb urine dipstick: NEGATIVE
Ketones, ur: NEGATIVE mg/dL
Leukocytes, UA: NEGATIVE
Nitrite: NEGATIVE
Protein, ur: NEGATIVE mg/dL
Specific Gravity, Urine: 1.01 (ref 1.005–1.030)
pH: 7 (ref 5.0–8.0)

## 2017-10-18 MED ORDER — IOPAMIDOL (ISOVUE-300) INJECTION 61%
100.0000 mL | Freq: Once | INTRAVENOUS | Status: AC | PRN
  Administered 2017-10-18: 100 mL via INTRAVENOUS

## 2017-10-18 MED ORDER — ONDANSETRON 8 MG PO TBDP: 8.0000 mg | ORAL_TABLET | Freq: Three times a day (TID) | ORAL | 0 refills | Status: DC | PRN

## 2017-10-18 MED ORDER — MORPHINE SULFATE (PF) 4 MG/ML IV SOLN
8.0000 mg | Freq: Once | INTRAVENOUS | Status: AC
  Administered 2017-10-18: 8 mg via INTRAVENOUS
  Filled 2017-10-18: qty 2

## 2017-10-18 MED ORDER — HYDROCODONE-ACETAMINOPHEN 5-325 MG PO TABS: 1.0000 | ORAL_TABLET | ORAL | 0 refills | Status: DC | PRN

## 2017-10-18 MED ORDER — SODIUM CHLORIDE 0.9 % IV BOLUS
1000.0000 mL | Freq: Once | INTRAVENOUS | Status: AC
  Administered 2017-10-18: 1000 mL via INTRAVENOUS

## 2017-10-18 MED ORDER — MORPHINE SULFATE (PF) 4 MG/ML IV SOLN
4.0000 mg | Freq: Once | INTRAVENOUS | Status: AC
  Administered 2017-10-18: 4 mg via INTRAVENOUS
  Filled 2017-10-18: qty 1

## 2017-10-18 MED ORDER — IOPAMIDOL (ISOVUE-300) INJECTION 61%
30.0000 mL | Freq: Once | INTRAVENOUS | Status: AC | PRN
  Administered 2017-10-18: 15 mL via ORAL

## 2017-10-18 MED ORDER — METOCLOPRAMIDE HCL 5 MG/ML IJ SOLN
10.0000 mg | Freq: Once | INTRAMUSCULAR | Status: AC
  Administered 2017-10-18: 10 mg via INTRAVENOUS
  Filled 2017-10-18: qty 2

## 2017-10-18 MED ORDER — ONDANSETRON HCL 4 MG/2ML IJ SOLN
4.0000 mg | Freq: Once | INTRAMUSCULAR | Status: AC
  Administered 2017-10-18: 4 mg via INTRAVENOUS
  Filled 2017-10-18: qty 2

## 2017-10-18 MED FILL — ONDANSETRON ODT 8 MG TABLET: 8 | 3 days supply | Qty: 10 | Fill #0

## 2017-10-18 MED FILL — HYDROCODON-APAP 5-325: 5-325 | 2 days supply | Qty: 15 | Fill #0

## 2017-10-18 NOTE — ED Triage Notes (Signed)
Pt reports right upper and lower abd pain x last night, aching in nature, with vomiting. Last bm this am, liquid diarrhea. Denies any fevers.

## 2017-10-18 NOTE — ED Notes (Signed)
Pt vomited while edp in the room. New order for Reglan.  VO to wait 20 min prior to [place] pt.

## 2017-10-18 NOTE — Discharge Instructions (Signed)
You have a cyst on your right kidney that will need repeat Ultrasound in 12 months  Please return to the emergency department for new or worsening symptoms

## 2017-10-23 MED FILL — raNITIdine HCL 300 MG TABS: 300 | 30 days supply | Qty: 30 | Fill #0

## 2017-10-23 NOTE — ED Provider Notes (Signed)
[place] EMERGENCY DEPARTMENT Provider Note   CSN: [id number] Arrival date & time: 10/18/17  0909     History   Chief Complaint Chief Complaint  Patient presents with  . Abdominal Pain    HPI Misty [name] is a 42 y.o. female.  HPI Patient is a 42 year old female presents the emergency department with complaints of upper abdominal pain.  She reports nausea vomiting and some diarrhea.  Her symptoms began overnight.  No fevers or chills.  She is never had pain or discomfort like this before.  Her nursing evaluation she does have some tenderness in the right lower quadrant.  This was not initially present on her abdominal exam.  She admits right-sided abdominal pain both right upper quadrant and right lower quadrant.  No flank pain.  No urinary symptoms.  No fevers or chills.  Tried over-the-counter medication without improvement in her symptoms.  No history of gallstones.   Past Medical History:  Diagnosis Date  . Asthma   . Constipation   . Dysuria   . Frequency of urination   . Hematuria   . History of kidney stones   . Nocturia   . Right ureteral stone   . Urgency of urination     Patient Active Problem List   Diagnosis Date Noted  . UTI 04/10/2007  . ALLERGIC RHINITIS 10/30/2006  . NEPHROLITHIASIS, HX OF 10/30/2006    Past Surgical History:  Procedure Laterality Date  . CYSTO W/ RIGHT URETEROSCOPY  2006  . CYSTO/ RIGHT URETERAL STENT PLACEMENT  07-29-2009   LARGE RIGHT RENAL CALCULUS  . CYSTOLITHOLAPAXY OF LARGE BLADDER CALCULI/   RIGHT URETERAL STENT EXCHANGE  04-15-2010  . CYSTOSCOPY WITH STENT PLACEMENT  07/04/2012   Procedure: CYSTOSCOPY WITH STENT PLACEMENT;  Surgeon: Franchot Gallo, MD;  Location: [facility];  Service: Urology;  Laterality: Right;  rt dbl j stent placement   . CYSTOSCOPY/RETROGRADE/URETEROSCOPY/STONE EXTRACTION WITH BASKET  07/25/2012   Procedure: CYSTOSCOPY/RETROGRADE/URETEROSCOPY/STONE EXTRACTION WITH  BASKET;  Surgeon: Franchot Gallo, MD;  Location: [facility];  Service: Urology;  Laterality: Right;  . EXTRACORPOREAL SHOCK WAVE LITHOTRIPSY  08-02-2009   RIGHT  . HOLMIUM LASER APPLICATION  0/10/3974   Procedure: HOLMIUM LASER APPLICATION;  Surgeon: Franchot Gallo, MD;  Location: [facility];  Service: Urology;  Laterality: Right;  . LAPAROSCOPIC TUBAL LIGATION Bilateral 01/17/2013   Procedure: LAPAROSCOPIC TUBAL LIGATION;  Surgeon: Melina Schools, MD;  Location: [facility];  Service: Gynecology;  Laterality: Bilateral;  1hr OR time      OB History    Gravida  7   Para      Term      Preterm      AB  2   Living  5     SAB  1   TAB  1   Ectopic      Multiple      Live Births  5            Home Medications    Prior to Admission medications   Medication Sig Start Date End Date Taking? Authorizing Provider  ALPRAZolam Duanne Moron) 1 MG tablet Take 1 mg by mouth daily as needed for anxiety.     [provider]  amphetamine-dextroamphetamine (ADDERALL) 15 MG tablet Take 15 mg by mouth daily.    [provider]  aspirin-acetaminophen-caffeine (EXCEDRIN MIGRAINE) [phone] MG tablet Take 1 tablet by mouth every 6 (six) hours as needed for headache.  [provider]  Cyclobenzaprine HCl (FLEXERIL PO) Take by mouth.    [provider]  dextroamphetamine (DEXEDRINE SPANSULE) 15 MG 24 hr capsule Take 1 capsule by mouth 3 (three) times daily. 06/29/15   [provider]  escitalopram (LEXAPRO) 10 MG tablet Take 1 tablet (10 mg total) by mouth daily. 09/21/15   Pisciotta, Elmyra Ricks, PA-C  etonogestrel (NEXPLANON) 68 MG IMPL implant Inject 1 each into the skin once. Reported on 07/10/2015    [provider]  gabapentin (NEURONTIN) 300 MG capsule Take 1 capsule (300 mg total) by mouth 3 (three) times daily. Patient not taking: Reported on 07/10/2015 01/29/13   Ward, Delice Bison, DO  HYDROcodone-acetaminophen  (NORCO/VICODIN) 5-325 MG tablet Take 1 tablet by mouth every 4 (four) hours as needed for moderate pain. 10/18/17   Jola Schmidt, MD  ibuprofen (ADVIL,MOTRIN) 600 MG tablet Take 1 tablet (600 mg total) by mouth every 6 (six) hours as needed. Patient taking differently: Take 600 mg by mouth every 6 (six) hours as needed for headache or moderate pain.  06/04/13   Robyn Haber, MD  montelukast (SINGULAIR) 10 MG tablet Take 1 tablet by mouth daily. 06/28/15   [provider]  naproxen sodium (ANAPROX) 220 MG tablet Take 220 mg by mouth daily as needed (for pain).     [provider]  nystatin-triamcinolone (MYCOLOG II) cream Apply 1 application topically daily as needed. For skin irritation 07/06/15   [provider]  ondansetron (ZOFRAN ODT) 8 MG disintegrating tablet Take 1 tablet (8 mg total) by mouth every 8 (eight) hours as needed for nausea or vomiting. 10/18/17   Jola Schmidt, MD  oxyCODONE-acetaminophen (PERCOCET/ROXICET) 5-325 MG per tablet Take 2 tablets by mouth every 6 (six) hours as needed for pain. Patient not taking: Reported on 07/10/2015 01/29/13   Ward, Delice Bison, DO  oxyCODONE-acetaminophen (ROXICET) 5-325 MG per tablet Take 1 tablet by mouth every 4 (four) hours as needed for pain. Patient not taking: Reported on 07/10/2015 01/17/13   Newton Pigg, MD  polyethylene glycol [facility]) packet Take 17 g by mouth daily. Patient not taking: Reported on 07/10/2015 01/19/13   Seabron Spates, CNM  ramelteon (ROZEREM) 8 MG tablet Take 1 tablet (8 mg total) by mouth at bedtime as needed for sleep. 09/21/15   Pisciotta, Elmyra Ricks, PA-C  TRAMADOL HCL ER PO Take by mouth.    [provider]    Family History History reviewed. No pertinent family history.  Social History Social History   Tobacco Use  . Smoking status: Never Smoker  . Smokeless tobacco: Never Used  Substance Use Topics  . Alcohol use: Yes    Comment: occ  . Drug use: No     Allergies     Dilaudid [hydromorphone hcl]; Ceftriaxone sodium; and Latex   Review of Systems Review of Systems  All other systems reviewed and are negative.    Physical Exam Updated Vital Signs BP 119/86 (BP Location: Right Arm)   Pulse 91   Temp 98.8 F (37.1 C) (Oral)   Resp 18   Ht 5\' 9"  (1.753 m)   Wt 94.3 kg (208 lb)   LMP 10/04/2017   SpO2 98%   BMI 30.72 kg/m   Physical Exam  Constitutional: She is oriented to person, place, and time. She appears well-developed and well-nourished. No distress.  HENT:  Head: Normocephalic and atraumatic.  Eyes: EOM are normal.  Neck: Normal range of motion.  Cardiovascular: Normal rate, regular rhythm and normal  heart sounds.  Pulmonary/Chest: Effort normal and breath sounds normal.  Abdominal: Soft. She exhibits no distension.  Mild right upper quadrant right lower quadrant tenderness without guarding rebound.  No peritoneal signs.  Musculoskeletal: Normal range of motion.  Neurological: She is alert and oriented to person, place, and time.  Skin: Skin is warm and dry.  Psychiatric: She has a normal mood and affect. Judgment normal.  Nursing note and vitals reviewed.    ED Treatments / Results  Labs (all labs ordered are listed, but only abnormal results are displayed) Labs Reviewed  CBC - Abnormal; Notable for the following components:      Result Value   RBC 5.49 (*)    MCV 77.0 (*)    MCH 25.7 (*)    RDW 17.2 (*)    All other components within normal limits  COMPREHENSIVE METABOLIC PANEL - Abnormal; Notable for the following components:   Glucose, Bld 103 (*)    All other components within normal limits  LIPASE, BLOOD  PREGNANCY, URINE  URINALYSIS, ROUTINE W REFLEX MICROSCOPIC    EKG None  Radiology No results found.  Procedures Procedures (including critical care time)  Medications Ordered in ED Medications  ondansetron (ZOFRAN) injection 4 mg (4 mg Intravenous Given 10/18/17 1015)  morphine 4 MG/ML injection 8 mg  (8 mg Intravenous Given 10/18/17 1016)  sodium chloride 0.9 % bolus 1,000 mL (0 mLs Intravenous Stopped 10/18/17 1238)  morphine 4 MG/ML injection 4 mg (4 mg Intravenous Given 10/18/17 1238)  iopamidol (ISOVUE-300) 61 % injection 30 mL (15 mLs Oral Contrast Given 10/18/17 1325)  iopamidol (ISOVUE-300) 61 % injection 100 mL (100 mLs Intravenous Contrast Given 10/18/17 1324)  metoCLOPramide (REGLAN) injection 10 mg (10 mg Intravenous Given 10/18/17 1444)     Initial Impression / Assessment and Plan / ED Course  I have reviewed the triage vital signs and the nursing notes.  Pertinent labs & imaging results that were available during my care of the patient were reviewed by me and considered in my medical decision making (see chart for details).     Initially the patient's symptoms were concerning for cholelithiasis.  Ultrasound demonstrates no signs of cholecystitis or cholelithiasis.  Developed some mild right lower quadrant pain while in the emergency department.  CT scan was performed which demonstrates no acute abnormality which could explain the patient's symptoms.  Appendix is normal in appearance.  She was found to have an abnormal appearing cyst on her kidney.  This will need follow-up imaging.  She is aware of this.  She did vomit one more time in the emergency department prior to discharge and this was improved with Reglan.  She will be discharged home with antiemetics.  She understands return the emergency department for new or worsening symptoms.  Repeat abdominal exam is without peritoneal signs.  She is overall improved in the emergency department.  Final Clinical Impressions(s) / ED Diagnoses   Final diagnoses:  Abdominal pain, unspecified abdominal location    ED Discharge Orders        Ordered    ondansetron (ZOFRAN ODT) 8 MG disintegrating tablet  Every 8 hours PRN     10/18/17 1416    HYDROcodone-acetaminophen (NORCO/VICODIN) 5-325 MG tablet  Every 4 hours PRN     10/18/17 1515        Jola Schmidt, MD 10/23/17 1022

## 2017-12-03 DIAGNOSIS — Z01419 Encounter for gynecological examination (general) (routine) without abnormal findings: Secondary | ICD-10-CM | POA: Diagnosis not present

## 2017-12-03 DIAGNOSIS — N840 Polyp of corpus uteri: Secondary | ICD-10-CM | POA: Diagnosis not present

## 2017-12-03 DIAGNOSIS — B372 Candidiasis of skin and nail: Secondary | ICD-10-CM | POA: Diagnosis not present

## 2017-12-03 DIAGNOSIS — Z130 Encounter for screening for diseases of the blood and blood-forming organs and certain disorders involving the immune mechanism: Secondary | ICD-10-CM | POA: Diagnosis not present

## 2017-12-03 DIAGNOSIS — N946 Dysmenorrhea, unspecified: Secondary | ICD-10-CM | POA: Diagnosis not present

## 2017-12-03 DIAGNOSIS — Z1389 Encounter for screening for other disorder: Secondary | ICD-10-CM | POA: Diagnosis not present

## 2017-12-03 MED FILL — NYSTATIN 100,000 UNIT/GM PO: 100000 | 30 days supply | Qty: 30 | Fill #0

## 2017-12-03 MED FILL — IBUPROFEN 800 MG TAB: 800 | 10 days supply | Qty: 30 | Fill #0

## 2017-12-04 MED FILL — raNITIdine HCL 300 MG TABS: 300 | 30 days supply | Qty: 30 | Fill #0

## 2017-12-28 MED FILL — D-AMPHETAMINE ER 15 MG CAP: 15 | 90 days supply | Qty: 360 | Fill #0

## 2017-12-31 MED FILL — ALPRAZolam 1 MG TABS: 1 | 90 days supply | Qty: 360 | Fill #0

## 2018-01-10 DIAGNOSIS — F900 Attention-deficit hyperactivity disorder, predominantly inattentive type: Secondary | ICD-10-CM | POA: Diagnosis not present

## 2018-01-10 DIAGNOSIS — F411 Generalized anxiety disorder: Secondary | ICD-10-CM | POA: Diagnosis not present

## 2018-03-28 MED FILL — ALPRAZolam 1 MG TABS: 1 | 90 days supply | Qty: 360 | Fill #0

## 2018-03-29 MED FILL — D-AMPHETAMINE ER 15 MG CAP: 15 | 90 days supply | Qty: 360 | Fill #0

## 2018-06-06 MED FILL — FLUCONAZOLE 150 MG TABS: 150 | 3 days supply | Qty: 2 | Fill #0

## 2018-06-17 MED FILL — IBUPROFEN 600 MG TABLET: 600 | 4 days supply | Qty: 12 | Fill #0

## 2018-06-17 MED FILL — MEDROXYPROGESTERONE 10 MG T: 10 | 30 days supply | Qty: 60 | Fill #0

## 2018-06-28 DIAGNOSIS — H18413 Arcus senilis, bilateral: Secondary | ICD-10-CM | POA: Diagnosis not present

## 2018-06-28 DIAGNOSIS — H5213 Myopia, bilateral: Secondary | ICD-10-CM | POA: Diagnosis not present

## 2018-06-28 DIAGNOSIS — H524 Presbyopia: Secondary | ICD-10-CM | POA: Diagnosis not present

## 2018-07-04 DIAGNOSIS — F410 Panic disorder [episodic paroxysmal anxiety] without agoraphobia: Secondary | ICD-10-CM | POA: Diagnosis not present

## 2018-07-04 DIAGNOSIS — F900 Attention-deficit hyperactivity disorder, predominantly inattentive type: Secondary | ICD-10-CM | POA: Diagnosis not present

## 2018-07-04 DIAGNOSIS — F411 Generalized anxiety disorder: Secondary | ICD-10-CM | POA: Diagnosis not present

## 2018-07-04 MED FILL — ALPRAZolam 1 MG TABS: 1 | 90 days supply | Qty: 360 | Fill #0

## 2018-07-04 MED FILL — AMPHETAMINE SALTS 30 MG TAB: 30 | 90 days supply | Qty: 180 | Fill #0

## 2018-07-04 MED FILL — ADDERALL XR 30 MG CAP SA: 30 | 90 days supply | Qty: 90 | Fill #0

## 2018-07-04 MED FILL — LATISSE 0.03% EYELASH SOLN: 0.03 | 20 days supply | Qty: 3 | Fill #0

## 2018-07-16 DIAGNOSIS — S51851A Open bite of right forearm, initial encounter: Secondary | ICD-10-CM | POA: Diagnosis not present

## 2018-07-16 DIAGNOSIS — Z23 Encounter for immunization: Secondary | ICD-10-CM | POA: Diagnosis not present

## 2018-08-01 DIAGNOSIS — F331 Major depressive disorder, recurrent, moderate: Secondary | ICD-10-CM | POA: Diagnosis not present

## 2018-08-01 DIAGNOSIS — F4311 Post-traumatic stress disorder, acute: Secondary | ICD-10-CM | POA: Diagnosis not present

## 2018-08-27 DIAGNOSIS — F331 Major depressive disorder, recurrent, moderate: Secondary | ICD-10-CM | POA: Diagnosis not present

## 2018-08-27 DIAGNOSIS — F4311 Post-traumatic stress disorder, acute: Secondary | ICD-10-CM | POA: Diagnosis not present

## 2018-09-12 DIAGNOSIS — F4311 Post-traumatic stress disorder, acute: Secondary | ICD-10-CM | POA: Diagnosis not present

## 2018-09-12 DIAGNOSIS — F331 Major depressive disorder, recurrent, moderate: Secondary | ICD-10-CM | POA: Diagnosis not present

## 2018-09-30 MED FILL — ALPRAZolam 1 MG TABS: 1 | 90 days supply | Qty: 360 | Fill #1

## 2018-10-02 MED FILL — AMPHETAMINE-DEXTROAMPHETAMI: 30 | 90 days supply | Qty: 180 | Fill #0

## 2018-10-02 MED FILL — ADDERALL XR 30 MG CAP SA: 30 | 90 days supply | Qty: 90 | Fill #0

## 2018-10-11 MED FILL — SUMATRIPTAN SUCC 50 MG TAB: 50 | 30 days supply | Qty: 9 | Fill #0

## 2018-10-17 MED FILL — FLUCONAZOLE 150 MG TABS: 150 | 3 days supply | Qty: 2 | Fill #0

## 2018-11-20 DIAGNOSIS — N898 Other specified noninflammatory disorders of vagina: Secondary | ICD-10-CM | POA: Diagnosis not present

## 2018-11-20 DIAGNOSIS — R309 Painful micturition, unspecified: Secondary | ICD-10-CM | POA: Diagnosis not present

## 2018-11-20 DIAGNOSIS — N760 Acute vaginitis: Secondary | ICD-10-CM | POA: Diagnosis not present

## 2018-11-20 DIAGNOSIS — L292 Pruritus vulvae: Secondary | ICD-10-CM | POA: Diagnosis not present

## 2018-11-20 MED FILL — TRIAMCINOLONE 0.1% OINTMENT: 0.1 | 20 days supply | Qty: 30 | Fill #0

## 2018-11-20 MED FILL — METRONIDAZOLE 500 MG TABS: 500 | 7 days supply | Qty: 14 | Fill #0

## 2018-12-09 DIAGNOSIS — Z130 Encounter for screening for diseases of the blood and blood-forming organs and certain disorders involving the immune mechanism: Secondary | ICD-10-CM | POA: Diagnosis not present

## 2018-12-09 DIAGNOSIS — N946 Dysmenorrhea, unspecified: Secondary | ICD-10-CM | POA: Diagnosis not present

## 2018-12-09 DIAGNOSIS — Z6829 Body mass index (BMI) 29.0-29.9, adult: Secondary | ICD-10-CM | POA: Diagnosis not present

## 2018-12-09 DIAGNOSIS — L989 Disorder of the skin and subcutaneous tissue, unspecified: Secondary | ICD-10-CM | POA: Diagnosis not present

## 2018-12-09 DIAGNOSIS — Z1231 Encounter for screening mammogram for malignant neoplasm of breast: Secondary | ICD-10-CM | POA: Diagnosis not present

## 2018-12-09 DIAGNOSIS — Z124 Encounter for screening for malignant neoplasm of cervix: Secondary | ICD-10-CM | POA: Diagnosis not present

## 2018-12-09 DIAGNOSIS — Z01419 Encounter for gynecological examination (general) (routine) without abnormal findings: Secondary | ICD-10-CM | POA: Diagnosis not present

## 2018-12-09 DIAGNOSIS — Z1389 Encounter for screening for other disorder: Secondary | ICD-10-CM | POA: Diagnosis not present

## 2018-12-13 ENCOUNTER — Other Ambulatory Visit: Payer: Self-pay | Admitting: Family Medicine

## 2018-12-13 ENCOUNTER — Other Ambulatory Visit: Payer: Self-pay | Admitting: Obstetrics and Gynecology

## 2018-12-13 DIAGNOSIS — N63 Unspecified lump in unspecified breast: Secondary | ICD-10-CM

## 2018-12-13 DIAGNOSIS — N6489 Other specified disorders of breast: Secondary | ICD-10-CM

## 2018-12-18 ENCOUNTER — Other Ambulatory Visit: Payer: Self-pay

## 2018-12-18 ENCOUNTER — Ambulatory Visit
Admission: RE | Admit: 2018-12-18 | Discharge: 2018-12-18 | Disposition: A | Discharge status: Home / Self Care | Payer: 59 | Source: Ambulatory Visit | Attending: Obstetrics and Gynecology | Admitting: Obstetrics and Gynecology

## 2018-12-18 DIAGNOSIS — N63 Unspecified lump in unspecified breast: Secondary | ICD-10-CM

## 2018-12-18 DIAGNOSIS — N6001 Solitary cyst of right breast: Secondary | ICD-10-CM | POA: Diagnosis not present

## 2018-12-18 DIAGNOSIS — N6489 Other specified disorders of breast: Secondary | ICD-10-CM

## 2018-12-18 DIAGNOSIS — N6321 Unspecified lump in the left breast, upper outer quadrant: Secondary | ICD-10-CM | POA: Diagnosis not present

## 2018-12-18 DIAGNOSIS — R922 Inconclusive mammogram: Secondary | ICD-10-CM | POA: Diagnosis not present

## 2018-12-18 DIAGNOSIS — N6313 Unspecified lump in the right breast, lower outer quadrant: Secondary | ICD-10-CM | POA: Diagnosis not present

## 2018-12-19 DIAGNOSIS — Z0000 Encounter for general adult medical examination without abnormal findings: Secondary | ICD-10-CM | POA: Diagnosis not present

## 2018-12-19 DIAGNOSIS — Z13220 Encounter for screening for lipoid disorders: Secondary | ICD-10-CM | POA: Diagnosis not present

## 2018-12-19 DIAGNOSIS — F419 Anxiety disorder, unspecified: Secondary | ICD-10-CM | POA: Diagnosis not present

## 2018-12-19 DIAGNOSIS — E559 Vitamin D deficiency, unspecified: Secondary | ICD-10-CM | POA: Diagnosis not present

## 2018-12-19 DIAGNOSIS — J3089 Other allergic rhinitis: Secondary | ICD-10-CM | POA: Diagnosis not present

## 2018-12-19 DIAGNOSIS — F324 Major depressive disorder, single episode, in partial remission: Secondary | ICD-10-CM | POA: Diagnosis not present

## 2018-12-19 DIAGNOSIS — J4520 Mild intermittent asthma, uncomplicated: Secondary | ICD-10-CM | POA: Diagnosis not present

## 2018-12-19 DIAGNOSIS — K219 Gastro-esophageal reflux disease without esophagitis: Secondary | ICD-10-CM | POA: Diagnosis not present

## 2018-12-19 DIAGNOSIS — F909 Attention-deficit hyperactivity disorder, unspecified type: Secondary | ICD-10-CM | POA: Diagnosis not present

## 2018-12-19 MED FILL — MONTELUKAST SOD 10 MG TAB: 10 | 90 days supply | Qty: 90 | Fill #0

## 2018-12-19 MED FILL — ALBUTEROL SULFATE HFA 108 (: 108 (90 BAS | 17 days supply | Qty: 9 | Fill #0

## 2018-12-26 DIAGNOSIS — F4322 Adjustment disorder with anxiety: Secondary | ICD-10-CM | POA: Diagnosis not present

## 2018-12-26 DIAGNOSIS — F900 Attention-deficit hyperactivity disorder, predominantly inattentive type: Secondary | ICD-10-CM | POA: Diagnosis not present

## 2018-12-26 MED FILL — AMPHETAMINE-DEXTROAMPHETAMI: 30 | 90 days supply | Qty: 180 | Fill #0

## 2018-12-26 MED FILL — ESCITALOPRAM 20 MG TABLET: 20 | 90 days supply | Qty: 90 | Fill #0

## 2018-12-26 MED FILL — IBUPROFEN 800 MG TAB: 800 | 10 days supply | Qty: 30 | Fill #0

## 2018-12-27 MED FILL — ALPRAZolam 1 MG TABS: 1 | 90 days supply | Qty: 360 | Fill #0

## 2019-02-19 MED FILL — FLUCONAZOLE 150 MG TABS: 150 | 3 days supply | Qty: 2 | Fill #0

## 2019-02-26 MED FILL — AMPHETAMINE-DEXTROAMPHETAMI: 30 | 90 days supply | Qty: 270 | Fill #0

## 2019-04-11 ENCOUNTER — Encounter: Payer: Self-pay | Admitting: Gastroenterology

## 2019-05-13 ENCOUNTER — Other Ambulatory Visit (INDEPENDENT_AMBULATORY_CARE_PROVIDER_SITE_OTHER): Payer: 59

## 2019-05-13 ENCOUNTER — Encounter: Payer: Self-pay | Admitting: Gastroenterology

## 2019-05-13 ENCOUNTER — Ambulatory Visit (INDEPENDENT_AMBULATORY_CARE_PROVIDER_SITE_OTHER): Payer: 59 | Admitting: Gastroenterology

## 2019-05-13 VITALS — BP 128/80 | HR 73 | Temp 97.4°F | Ht 69.0 in | Wt 207.0 lb

## 2019-05-13 DIAGNOSIS — K219 Gastro-esophageal reflux disease without esophagitis: Secondary | ICD-10-CM

## 2019-05-13 DIAGNOSIS — Z1159 Encounter for screening for other viral diseases: Secondary | ICD-10-CM | POA: Diagnosis not present

## 2019-05-13 DIAGNOSIS — R1013 Epigastric pain: Secondary | ICD-10-CM | POA: Diagnosis not present

## 2019-05-13 LAB — CBC WITH DIFFERENTIAL/PLATELET
Basophils Absolute: 0 10*3/uL (ref 0.0–0.1)
Basophils Relative: 0.6 % (ref 0.0–3.0)
Eosinophils Absolute: 0.1 10*3/uL (ref 0.0–0.7)
Eosinophils Relative: 1.1 % (ref 0.0–5.0)
HCT: 36.8 % (ref 36.0–46.0)
Hemoglobin: 11.7 g/dL — ABNORMAL LOW (ref 12.0–15.0)
Lymphocytes Relative: 50.9 % — ABNORMAL HIGH (ref 12.0–46.0)
Lymphs Abs: 2.4 10*3/uL (ref 0.7–4.0)
MCHC: 31.9 g/dL (ref 30.0–36.0)
MCV: 75.8 fl — ABNORMAL LOW (ref 78.0–100.0)
Monocytes Absolute: 0.5 10*3/uL (ref 0.1–1.0)
Monocytes Relative: 9.9 % (ref 3.0–12.0)
Neutro Abs: 1.8 10*3/uL (ref 1.4–7.7)
Neutrophils Relative %: 37.5 % — ABNORMAL LOW (ref 43.0–77.0)
Platelets: 275 10*3/uL (ref 150.0–400.0)
RBC: 4.86 Mil/uL (ref 3.87–5.11)
RDW: 18.1 % — ABNORMAL HIGH (ref 11.5–15.5)
WBC: 4.7 10*3/uL (ref 4.0–10.5)

## 2019-05-13 LAB — COMPREHENSIVE METABOLIC PANEL
ALT: 16 U/L (ref 0–35)
AST: 13 U/L (ref 0–37)
Albumin: 3.9 g/dL (ref 3.5–5.2)
Alkaline Phosphatase: 56 U/L (ref 39–117)
BUN: 13 mg/dL (ref 6–23)
CO2: 27 mEq/L (ref 19–32)
Calcium: 10.5 mg/dL (ref 8.4–10.5)
Chloride: 105 mEq/L (ref 96–112)
Creatinine, Ser: 0.77 mg/dL (ref 0.40–1.20)
GFR: 97.86 mL/min (ref >60.00)
Glucose, Bld: 92 mg/dL (ref 70–99)
Potassium: 3.5 mEq/L (ref 3.5–5.1)
Sodium: 138 mEq/L (ref 135–145)
Total Bilirubin: 0.4 mg/dL (ref 0.2–1.2)
Total Protein: 6.9 g/dL (ref 6.0–8.3)

## 2019-05-13 MED ORDER — OMEPRAZOLE 40 MG PO CPDR: 40.0000 mg | DELAYED_RELEASE_CAPSULE | Freq: Every day | ORAL | 11 refills | Status: DC

## 2019-05-13 MED FILL — OMEPRAZOLE 40 MG CPDR: 40 | 30 days supply | Qty: 30 | Fill #0

## 2019-05-13 NOTE — Patient Instructions (Signed)
Your provider has requested that you go to the basement level for lab work before leaving today. Press "B" on the elevator. The lab is located at the first door on the left as you exit the elevator.  We have sent the following medications to your pharmacy for you to pick up at your convenience: omeprazole  Refrain from taking over th counter NSAID's.   You have been scheduled for an endoscopy. Please follow written instructions given to you at your visit today. If you use inhalers (even only as needed), please bring them with you on the day of your procedure.

## 2019-05-13 NOTE — Progress Notes (Signed)
HPI: This is a pleasant 43 year old woman who was referred to me by [name], MD  to evaluate intermittent abdominal pains GERD.    For the past 2 to 3 months she has had intermittent abdominal pains in the epigastric region associate with tightness nausea.  She describes it as a burning sensation.  It can last for hours.  She has not had nausea or vomiting with it.  She has chronic GERD is well, without dysphagia.  She used to take Zantac but stopped when it was felt to potentially cause cancer.  She is not on any antiacid medicines routinely right now.  She has lost about 3 pounds in the past month or 2.  She takes a variety of NSAID type medicines for a variety of pains.  She has menstrual pains.  She has headaches.  She takes Aleve, ibuprofen, Goody powders, Excedrin.  Not all on the same day and not all every day but it seems like she does take a variety of NSAIDs pretty routinely.  Certainly around the time of her menstrual cycle she takes what is probably an excessive amount of NSAIDs even.  Old Data Reviewed: CT scan abdomen pelvis with IV and oral contrast May 2019, indication "abdominal pain and cramping" findings.  Right renal calculi, fibroid uterus, minimal ventral hernia containing only fat Stable small left adrenal mass of uncertain etiology, no evidence of bowel obstruction or abscess appendix feels normal.    Review of systems: Pertinent positive and negative review of systems were noted in the above HPI section. All other review negative.   Past Medical History:  Diagnosis Date  . Asthma   . Constipation   . Dysuria   . Frequency of urination   . Hematuria   . History of kidney stones   . Nocturia   . Right ureteral stone   . Urgency of urination     Past Surgical History:  Procedure Laterality Date  . CYSTO W/ RIGHT URETEROSCOPY  2006  . CYSTO/ RIGHT URETERAL STENT PLACEMENT  07-29-2009   LARGE RIGHT RENAL CALCULUS  . CYSTOLITHOLAPAXY OF LARGE BLADDER  CALCULI/   RIGHT URETERAL STENT EXCHANGE  04-15-2010  . CYSTOSCOPY WITH STENT PLACEMENT  07/04/2012   Procedure: CYSTOSCOPY WITH STENT PLACEMENT;  Surgeon: Franchot Gallo, MD;  Location: [facility];  Service: Urology;  Laterality: Right;  rt dbl j stent placement   . CYSTOSCOPY/RETROGRADE/URETEROSCOPY/STONE EXTRACTION WITH BASKET  07/25/2012   Procedure: CYSTOSCOPY/RETROGRADE/URETEROSCOPY/STONE EXTRACTION WITH BASKET;  Surgeon: Franchot Gallo, MD;  Location: [facility];  Service: Urology;  Laterality: Right;  . EXTRACORPOREAL SHOCK WAVE LITHOTRIPSY  08-02-2009   RIGHT  . HOLMIUM LASER APPLICATION  [id number]   Procedure: HOLMIUM LASER APPLICATION;  Surgeon: Franchot Gallo, MD;  Location: [facility];  Service: Urology;  Laterality: Right;  . LAPAROSCOPIC TUBAL LIGATION Bilateral 01/17/2013   Procedure: LAPAROSCOPIC TUBAL LIGATION;  Surgeon: Melina Schools, MD;  Location: [facility];  Service: Gynecology;  Laterality: Bilateral;  1hr OR time     Current Outpatient Medications  Medication Sig Dispense Refill  . ALPRAZolam (XANAX) 1 MG tablet Take 1 mg by mouth daily as needed for anxiety.     Marland Kitchen amphetamine-dextroamphetamine (ADDERALL) 15 MG tablet Take 15 mg by mouth daily.    Marland Kitchen ibuprofen (ADVIL,MOTRIN) 600 MG tablet Take 1 tablet (600 mg total) by mouth every 6 (six) hours as needed. (Patient taking differently: Take 600 mg by mouth every 6 (six) hours as  needed for headache or moderate pain. ) 30 tablet 5  . montelukast (SINGULAIR) 10 MG tablet Take 1 tablet by mouth daily.  4   No current facility-administered medications for this visit.     Allergies as of 05/13/2019 - Review Complete 05/13/2019  Allergen Reaction Noted  . Dilaudid [hydromorphone hcl] Shortness Of Breath and Nausea And Vomiting 07/04/2012  . Ceftriaxone sodium Other (See Comments) 07/22/2007  . Latex Rash 10/31/2011    Family History  Problem Relation Age of Onset  .  Breast cancer Maternal Aunt   . Breast cancer Maternal Grandmother   . Stomach cancer Neg Hx   . Colon cancer Neg Hx   . Pancreatic cancer Neg Hx   . Esophageal cancer Neg Hx     Social History   Socioeconomic History  . Marital status: Married    Spouse name: Not on file  . Number of children: Not on file  . Years of education: Not on file  . Highest education level: Not on file  Occupational History  . Not on file  Social Needs  . Financial resource strain: Not on file  . Food insecurity    Worry: Not on file    Inability: Not on file  . Transportation needs    Medical: Not on file    Non-medical: Not on file  Tobacco Use  . Smoking status: Never Smoker  . Smokeless tobacco: Never Used  Substance and Sexual Activity  . Alcohol use: Yes    Comment: occ  . Drug use: No  . Sexual activity: Not on file  Lifestyle  . Physical activity    Days per week: Not on file    Minutes per session: Not on file  . Stress: Not on file  Relationships  . Social Herbalist on phone: Not on file    Gets together: Not on file    Attends religious service: Not on file    Active member of club or organization: Not on file    Attends meetings of clubs or organizations: Not on file    Relationship status: Not on file  . Intimate partner violence    Fear of current or ex partner: Not on file    Emotionally abused: Not on file    Physically abused: Not on file    Forced sexual activity: Not on file  Other Topics Concern  . Not on file  Social History Narrative  . Not on file     Physical Exam: BP 128/80   Pulse 73   Temp (!) 97.4 F (36.3 C)   Ht 5\' 9"  (1.753 m)   Wt 207 lb (93.9 kg)   BMI 30.57 kg/m  Constitutional: generally well-appearing Psychiatric: alert and oriented x3 Eyes: extraocular movements intact Mouth: oral pharynx moist, no lesions Neck: supple no lymphadenopathy Cardiovascular: heart regular rate and rhythm Lungs: clear to auscultation  bilaterally Abdomen: soft, nontender, nondistended, no obvious ascites, no peritoneal signs, normal bowel sounds Extremities: no lower extremity edema bilaterally Skin: no lesions on visible extremities   Assessment and plan: 43 y.o. female with intermittent abdominal pain, chronic GERD, intermittent nausea.  Excessive NSAID use  I think there is certainly a good chance that her NSAID use is contributing to her GI symptoms.  I recommended that she try to cut back on her NSAIDs as best that she can.  I want to try her on once daily omeprazole 40 mg and I recommended an EGD at  her soonest convenience to check for peptic ulcer disease, H. pylori, significant reflux related damage.  She will also get a basic set of labs today including a CBC and a complete metabolic profile.   Please see the "Patient Instructions" section for addition details about the plan.   Owens Loffler, MD [facility] 05/13/2019, 2:50 PM  Cc: [name], MD

## 2019-05-20 ENCOUNTER — Encounter: Payer: Self-pay | Admitting: Gastroenterology

## 2019-05-21 ENCOUNTER — Ambulatory Visit (INDEPENDENT_AMBULATORY_CARE_PROVIDER_SITE_OTHER): Payer: 59

## 2019-05-21 ENCOUNTER — Other Ambulatory Visit: Payer: Self-pay | Admitting: Gastroenterology

## 2019-05-21 DIAGNOSIS — Z1159 Encounter for screening for other viral diseases: Secondary | ICD-10-CM | POA: Diagnosis not present

## 2019-05-22 LAB — SARS CORONAVIRUS 2 (TAT 6-24 HRS): SARS Coronavirus 2: NEGATIVE

## 2019-05-23 ENCOUNTER — Other Ambulatory Visit: Payer: Self-pay

## 2019-05-23 ENCOUNTER — Ambulatory Visit (AMBULATORY_SURGERY_CENTER): Payer: 59 | Admitting: Gastroenterology

## 2019-05-23 ENCOUNTER — Encounter: Payer: Self-pay | Admitting: Gastroenterology

## 2019-05-23 VITALS — BP 154/102 | HR 79 | Temp 99.2°F | Resp 17 | Ht 69.0 in | Wt 207.0 lb

## 2019-05-23 DIAGNOSIS — R12 Heartburn: Secondary | ICD-10-CM

## 2019-05-23 DIAGNOSIS — K219 Gastro-esophageal reflux disease without esophagitis: Secondary | ICD-10-CM

## 2019-05-23 DIAGNOSIS — K30 Functional dyspepsia: Secondary | ICD-10-CM

## 2019-05-23 DIAGNOSIS — F4322 Adjustment disorder with anxiety: Secondary | ICD-10-CM | POA: Diagnosis not present

## 2019-05-23 DIAGNOSIS — R1013 Epigastric pain: Secondary | ICD-10-CM | POA: Diagnosis not present

## 2019-05-23 HISTORY — PX: COLONOSCOPY: SHX174

## 2019-05-23 MED ORDER — SODIUM CHLORIDE 0.9 % IV SOLN: 500.0000 mL | INTRAVENOUS | Status: DC

## 2019-05-23 NOTE — Patient Instructions (Signed)
Thank you for allowing [place] to care for you today!  Follow up as needed with Dr Ardis Hughs.   Continue taking Omeprazole as it seems to be working for you.   When  possible to use Tylenol instead of NSAIDS (Ibuprofen, Alleve, Aspirin)  for pain.  Resume previous diet and other medications today.  Return to your normal activities tomorrow.      YOU HAD AN ENDOSCOPIC PROCEDURE TODAY AT [facility]:   Refer to the procedure report that was given to you for any specific questions about what was found during the examination.  If the procedure report does not answer your questions, please call your gastroenterologist to clarify.  If you requested that your care partner not be given the details of your procedure findings, then the procedure report has been included in a sealed envelope for you to review at your convenience later.  YOU SHOULD EXPECT: Some feelings of bloating in the abdomen. Passage of more gas than usual.  Walking can help get rid of the air that was put into your GI tract during the procedure and reduce the bloating. If you had a lower endoscopy (such as a colonoscopy or flexible sigmoidoscopy) you may notice spotting of blood in your stool or on the toilet paper. If you underwent a bowel prep for your procedure, you may not have a normal bowel movement for a few days.  Please Note:  You might notice some irritation and congestion in your nose or some drainage.  This is from the oxygen used during your procedure.  There is no need for concern and it should clear up in a day or so.  SYMPTOMS TO REPORT IMMEDIATELY:    Following upper endoscopy (EGD)  Vomiting of blood or coffee ground material  New chest pain or pain under the shoulder blades  Painful or persistently difficult swallowing  New shortness of breath  Fever of 100F or higher  Black, tarry-looking stools  For urgent or emergent issues, a gastroenterologist can be reached at any hour by calling [phone]  [phone].   DIET:  We do recommend a small meal at first, but then you may proceed to your regular diet.  Drink plenty of fluids but you should avoid alcoholic beverages for 24 hours.  ACTIVITY:  You should plan to take it easy for the rest of today and you should NOT DRIVE or use heavy machinery until tomorrow (because of the sedation medicines used during the test).    FOLLOW UP: Our staff will call the number listed on your records 48-72 hours following your procedure to check on you and address any questions or concerns that you may have regarding the information given to you following your procedure. If we do not reach you, we will leave a message.  We will attempt to reach you two times.  During this call, we will ask if you have developed any symptoms of COVID 19. If you develop any symptoms (ie: fever, flu-like symptoms, shortness of breath, cough etc.) before then, please call [phone].  If you test positive for Covid 19 in the 2 weeks post procedure, please call and report this information to [place].    If any biopsies were taken you will be contacted by phone or by letter within the next 1-3 weeks.  Please call us at [phone] if you have not heard about the biopsies in 3 weeks.    SIGNATURES/CONFIDENTIALITY: You and/or your care partner have signed paperwork which will  be entered into your electronic medical record.  These signatures attest to the fact that that the information above on your After Visit Summary has been reviewed and is understood.  Full responsibility of the confidentiality of this discharge information lies with you and/or your care-partner. 

## 2019-05-23 NOTE — Progress Notes (Signed)
Report to PACU, RN, vss, BBS= Clear.  

## 2019-05-23 NOTE — Op Note (Signed)
[facility] Patient Name: Misty [name] Procedure Date: 05/23/2019 1:58 PM MRN: [id number] Endoscopist: Milus Banister , MD Age: 43 Referring MD:  Date of Birth: 10-22-1975 Gender: Female Account #: [id number] Procedure:                Upper GI endoscopy Indications:              Dyspepsia, Heartburn Medicines:                Monitored Anesthesia Care Procedure:                Pre-Anesthesia Assessment:                           - Prior to the procedure, a History and Physical                            was performed, and patient medications and                            allergies were reviewed. The patient's tolerance of                            previous anesthesia was also reviewed. The risks                            and benefits of the procedure and the sedation                            options and risks were discussed with the patient.                            All questions were answered, and informed consent                            was obtained. Prior Anticoagulants: The patient has                            taken no previous anticoagulant or antiplatelet                            agents. ASA Grade Assessment: II - A patient with                            mild systemic disease. After reviewing the risks                            and benefits, the patient was deemed in                            satisfactory condition to undergo the procedure.                           After obtaining informed consent, the endoscope was  passed under direct vision. Throughout the                            procedure, the patient's blood pressure, pulse, and                            oxygen saturations were monitored continuously. The                            Endoscope was introduced through the mouth, and                            advanced to the second part of duodenum. The upper                            GI endoscopy was accomplished  without difficulty.                            The patient tolerated the procedure well. Scope In: Scope Out: Findings:                 The esophagus was normal.                           The stomach was normal.                           The examined duodenum was normal. Complications:            No immediate complications. Estimated blood loss:                            None. Estimated Blood Loss:     Estimated blood loss: none. Impression:               - Normal UGI tract. Recommendation:           - Patient has a contact number available for                            emergencies. The signs and symptoms of potential                            delayed complications were discussed with the                            patient. Return to normal activities tomorrow.                            Written discharge instructions were provided to the                            patient.                           - Resume previous diet.                           -  Continue present medications. Stay on once daily                            omeprazole, it seems to have really helped. Milus Banister, MD 05/23/2019 2:07:58 PM This report has been signed electronically.

## 2019-05-26 MED FILL — AMPHETAMINE-DEXTROAMPHETAMI: 30 | 90 days supply | Qty: 270 | Fill #0

## 2019-05-27 ENCOUNTER — Telehealth: Payer: Self-pay | Admitting: *Deleted

## 2019-05-27 NOTE — Telephone Encounter (Signed)
  Follow up Call-  Call back number 05/23/2019  Post procedure Call Back phone  # [phone]  Permission to leave phone message Yes  Some recent data might be hidden     Patient questions:  Do you have a fever, pain , or abdominal swelling? No. Pain Score  0 *  Have you tolerated food without any problems? Yes.    Have you been able to return to your normal activities? Yes.    Do you have any questions about your discharge instructions: Diet   No. Medications  No. Follow up visit  No.  Do you have questions or concerns about your Care? No.  Actions: * If pain score is 4 or above: No action needed, pain <4.   1. Have you developed a fever since your procedure? no  2.   Have you had an respiratory symptoms (SOB or cough) since your procedure? no  3.   Have you tested positive for COVID 19 since your procedure no  4.   Have you had any family members/close contacts diagnosed with the COVID 19 since your procedure?  no   If yes to any of these questions please route to Joylene John, RN and Alphonsa Gin, Therapist, sports.

## 2019-05-27 NOTE — Telephone Encounter (Signed)
Follow up call made, no opportunity to leave a message. 

## 2019-06-16 DIAGNOSIS — F411 Generalized anxiety disorder: Secondary | ICD-10-CM | POA: Diagnosis not present

## 2019-06-16 DIAGNOSIS — F900 Attention-deficit hyperactivity disorder, predominantly inattentive type: Secondary | ICD-10-CM | POA: Diagnosis not present

## 2019-06-24 MED FILL — OMEPRAZOLE 40 MG CPDR: 40 | 30 days supply | Qty: 30 | Fill #1

## 2019-06-24 MED FILL — ALPRAZolam 1 MG TABS: 1 | 90 days supply | Qty: 360 | Fill #0

## 2019-07-02 DIAGNOSIS — H5202 Hypermetropia, left eye: Secondary | ICD-10-CM | POA: Diagnosis not present

## 2019-07-02 DIAGNOSIS — H52222 Regular astigmatism, left eye: Secondary | ICD-10-CM | POA: Diagnosis not present

## 2019-07-02 DIAGNOSIS — H5201 Hypermetropia, right eye: Secondary | ICD-10-CM | POA: Diagnosis not present

## 2019-07-02 DIAGNOSIS — H52221 Regular astigmatism, right eye: Secondary | ICD-10-CM | POA: Diagnosis not present

## 2019-08-05 MED FILL — OMEPRAZOLE 40 MG CPDR: 40 | 30 days supply | Qty: 30 | Fill #2

## 2019-08-13 DIAGNOSIS — G43109 Migraine with aura, not intractable, without status migrainosus: Secondary | ICD-10-CM | POA: Diagnosis not present

## 2019-08-13 DIAGNOSIS — F419 Anxiety disorder, unspecified: Secondary | ICD-10-CM | POA: Diagnosis not present

## 2019-08-13 DIAGNOSIS — E213 Hyperparathyroidism, unspecified: Secondary | ICD-10-CM | POA: Diagnosis not present

## 2019-08-13 MED FILL — SUMATRIPTAN SUCC 50 MG TAB: 50 | 30 days supply | Qty: 9 | Fill #0

## 2019-08-13 MED FILL — ESCITALOPRAM 20 MG TABLET: 20 | 90 days supply | Qty: 90 | Fill #1

## 2019-08-13 MED FILL — MONTELUKAST SOD 10 MG TAB: 10 | 90 days supply | Qty: 90 | Fill #1

## 2019-08-24 MED FILL — AMPHETAMINE-DEXTROAMPHETAMI: 30 | 90 days supply | Qty: 270 | Fill #0

## 2019-08-25 ENCOUNTER — Telehealth: Payer: Self-pay

## 2019-08-25 NOTE — Telephone Encounter (Signed)
Left message on voicemail for patient to call back to schedule new patient appointment with Dr Kelton Pillar.   Patient called back and will call back when she gets her calender in front of her.

## 2019-09-22 MED FILL — ALPRAZolam 1 MG TABS: 1 | 90 days supply | Qty: 360 | Fill #1

## 2019-10-13 MED FILL — NYSTATIN 100,000 UNITS/GM O: 100000 | 10 days supply | Qty: 30 | Fill #0

## 2019-10-13 MED FILL — FLUCONAZOLE 150 MG TABS: 150 | 3 days supply | Qty: 2 | Fill #0

## 2019-10-15 MED FILL — ACETAMINOPHEN/COD #3 TABLET: 300-30 | 7 days supply | Qty: 21 | Fill #0

## 2019-11-20 MED FILL — OMEPRAZOLE 40 MG CPDR: 40 | 30 days supply | Qty: 30 | Fill #3

## 2019-11-21 MED FILL — AMPHETAMINE-DEXTROAMPHETAMI: 30 | 90 days supply | Qty: 270 | Fill #0

## 2019-12-15 DIAGNOSIS — J019 Acute sinusitis, unspecified: Secondary | ICD-10-CM | POA: Diagnosis not present

## 2019-12-29 DIAGNOSIS — Z1389 Encounter for screening for other disorder: Secondary | ICD-10-CM | POA: Diagnosis not present

## 2019-12-29 DIAGNOSIS — F410 Panic disorder [episodic paroxysmal anxiety] without agoraphobia: Secondary | ICD-10-CM | POA: Diagnosis not present

## 2019-12-29 DIAGNOSIS — Z6831 Body mass index (BMI) 31.0-31.9, adult: Secondary | ICD-10-CM | POA: Diagnosis not present

## 2019-12-29 DIAGNOSIS — D259 Leiomyoma of uterus, unspecified: Secondary | ICD-10-CM | POA: Diagnosis not present

## 2019-12-29 DIAGNOSIS — N946 Dysmenorrhea, unspecified: Secondary | ICD-10-CM | POA: Diagnosis not present

## 2019-12-29 DIAGNOSIS — F900 Attention-deficit hyperactivity disorder, predominantly inattentive type: Secondary | ICD-10-CM | POA: Diagnosis not present

## 2019-12-29 DIAGNOSIS — F4322 Adjustment disorder with anxiety: Secondary | ICD-10-CM | POA: Diagnosis not present

## 2019-12-29 DIAGNOSIS — E669 Obesity, unspecified: Secondary | ICD-10-CM | POA: Diagnosis not present

## 2019-12-29 DIAGNOSIS — Z130 Encounter for screening for diseases of the blood and blood-forming organs and certain disorders involving the immune mechanism: Secondary | ICD-10-CM | POA: Diagnosis not present

## 2019-12-29 DIAGNOSIS — Z01419 Encounter for gynecological examination (general) (routine) without abnormal findings: Secondary | ICD-10-CM | POA: Diagnosis not present

## 2019-12-30 MED FILL — traZODone HCL 50 MG TABS: 50 | 90 days supply | Qty: 270 | Fill #0

## 2019-12-30 MED FILL — ALPRAZolam 1 MG TABS: 1 | 90 days supply | Qty: 360 | Fill #0

## 2020-02-12 ENCOUNTER — Other Ambulatory Visit: Payer: Self-pay | Admitting: Obstetrics and Gynecology

## 2020-02-12 DIAGNOSIS — Z1231 Encounter for screening mammogram for malignant neoplasm of breast: Secondary | ICD-10-CM

## 2020-02-25 ENCOUNTER — Ambulatory Visit: Payer: Self-pay | Admitting: Podiatry

## 2020-02-25 MED FILL — FLUCONAZOLE 150 MG TABS: 150 | 3 days supply | Qty: 2 | Fill #0

## 2020-03-01 MED FILL — AMPHETAMINE-DEXTROAMPHETAMI: 30 | 90 days supply | Qty: 180 | Fill #0

## 2020-03-03 ENCOUNTER — Ambulatory Visit
Admission: RE | Admit: 2020-03-03 | Discharge: 2020-03-03 | Disposition: A | Discharge status: Home / Self Care | Payer: 59 | Source: Ambulatory Visit | Attending: Obstetrics and Gynecology | Admitting: Obstetrics and Gynecology

## 2020-03-03 ENCOUNTER — Other Ambulatory Visit: Payer: Self-pay

## 2020-03-03 DIAGNOSIS — Z1231 Encounter for screening mammogram for malignant neoplasm of breast: Secondary | ICD-10-CM

## 2020-03-22 ENCOUNTER — Other Ambulatory Visit (HOSPITAL_COMMUNITY): Payer: Self-pay | Admitting: Specialist

## 2020-03-24 MED FILL — ALPRAZOLAM 1 MG TABS: 1 | 90 days supply | Qty: 360 | Fill #0

## 2020-03-31 MED FILL — OMEPRAZOLE 40 MG CPDR: 40 | 30 days supply | Qty: 30 | Fill #4

## 2020-04-05 ENCOUNTER — Ambulatory Visit (INDEPENDENT_AMBULATORY_CARE_PROVIDER_SITE_OTHER): Payer: 59

## 2020-04-05 ENCOUNTER — Ambulatory Visit: Payer: 59 | Admitting: Podiatry

## 2020-04-05 ENCOUNTER — Other Ambulatory Visit: Payer: Self-pay | Admitting: Podiatry

## 2020-04-05 ENCOUNTER — Other Ambulatory Visit: Payer: Self-pay

## 2020-04-05 DIAGNOSIS — M722 Plantar fascial fibromatosis: Secondary | ICD-10-CM

## 2020-04-05 MED ORDER — METHYLPREDNISOLONE 4 MG PO TBPK: ORAL_TABLET | ORAL | 0 refills | Status: DC

## 2020-04-05 MED FILL — METHYLPREDNISOLONE 4 MG TBP: 4 | 6 days supply | Qty: 21 | Fill #0

## 2020-04-05 NOTE — Progress Notes (Signed)
   Subjective: 44 y.o. female presenting today as a new patient for evaluation of left arch pain. Patient states that she developed the pain a few months prior. She states that she used to wear heels however during Covid she has not been wearing heels. She recently went to a combination O and wear heels and that is when the pain developed. Since then she bought some crocs to help alleviate her pain since her coworkers wear crocs but that only aggravated the pain more. She experiences sharp pain beginning in the arch of the foot and it shoots all the way up her leg. She presents for further treatment evaluation   Past Medical History:  Diagnosis Date  . Anemia   . Asthma   . Constipation   . Dysuria   . Frequency of urination   . GERD (gastroesophageal reflux disease)   . Hematuria   . History of kidney stones   . Nocturia   . Right ureteral stone   . Thyroid disease   . Urgency of urination      Objective: Physical Exam General: The patient is alert and oriented x3 in no acute distress.  Dermatology: Skin is warm, dry and supple bilateral lower extremities. Negative for open lesions or macerations bilateral.   Vascular: Dorsalis Pedis and Posterior Tibial pulses palpable bilateral.  Capillary fill time is immediate to all digits.  Neurological: Epicritic and protective threshold intact bilateral. There is positive Tinel's sign with percussion of the arch of the foot that extends proximal all the way up the leg, likely irritating the tibial nerve  Musculoskeletal: Tenderness to palpation to the plantar aspect of the left mid arch along the plantar fascia. All other joints range of motion within normal limits bilateral. Strength 5/5 in all groups bilateral.   Radiographic exam: Normal osseous mineralization. Joint spaces preserved. No fracture/dislocation/boney destruction. No other soft tissue abnormalities or radiopaque foreign bodies. Accessory os peroneum noted  Assessment: 1.  Plantar fasciitis left foot-mid substance  Plan of Care:  1. Patient evaluated. Xrays reviewed.   2. Injection of 0.5cc Celestone soluspan injected into the left plantar fascia.  3. Rx for Medrol Dose Pak placed 4. No NSAIDs prescribed. Patient has history of GI issues with oral NSAIDs 5. Short cam boot dispensed. Weightbearing as tolerated x3 weeks  6. Return to clinic in 3 weeks.    *Works at [employer] [facility]  Edrick Kins, DPM [facility]  Dr. Edrick Kins, DPM    [address], [place] [address]                Office [phone]  Fax [phone]

## 2020-04-09 ENCOUNTER — Telehealth: Payer: Self-pay | Admitting: Podiatry

## 2020-04-09 NOTE — Telephone Encounter (Signed)
Pt called stating she is unable to take the medication you prescribed due to stomach issues. She would like to know if you could prescribe her, Gel Diclofenac. Please advise.

## 2020-04-19 DIAGNOSIS — M722 Plantar fascial fibromatosis: Secondary | ICD-10-CM

## 2020-04-19 HISTORY — DX: Plantar fascial fibromatosis: M72.2

## 2020-04-26 ENCOUNTER — Ambulatory Visit (INDEPENDENT_AMBULATORY_CARE_PROVIDER_SITE_OTHER): Payer: 59 | Admitting: Podiatry

## 2020-04-26 ENCOUNTER — Other Ambulatory Visit: Payer: Self-pay

## 2020-04-26 DIAGNOSIS — M722 Plantar fascial fibromatosis: Secondary | ICD-10-CM

## 2020-04-26 NOTE — Progress Notes (Signed)
   Subjective: 44 y.o. female presenting today for follow-up evaluation of plantar fasciitis to the left mid substance of the arch.  Patient states that she does feel slightly better since last visit.  She states that the injection and the prednisone pack helped.  She continues to have some residual pain with overall improvement though.  She presents for further treatment evaluation  Past Medical History:  Diagnosis Date  . Anemia   . Asthma   . Constipation   . Dysuria   . Frequency of urination   . GERD (gastroesophageal reflux disease)   . Hematuria   . History of kidney stones   . Nocturia   . Right ureteral stone   . Thyroid disease   . Urgency of urination      Objective: Physical Exam General: The patient is alert and oriented x3 in no acute distress.  Dermatology: Skin is warm, dry and supple bilateral lower extremities. Negative for open lesions or macerations bilateral.   Vascular: Dorsalis Pedis and Posterior Tibial pulses palpable bilateral.  Capillary fill time is immediate to all digits.  Neurological: Epicritic and protective threshold intact bilateral. There is positive Tinel's sign with percussion of the arch of the foot that extends proximal all the way up the leg, likely irritating the tibial nerve  Musculoskeletal: Tenderness to palpation to the plantar aspect of the left mid arch along the plantar fascia. All other joints range of motion within normal limits bilateral. Strength 5/5 in all groups bilateral.   Assessment: 1. Plantar fasciitis left foot-mid substance  Plan of Care:  1. Patient evaluated.  2.  Night splint dispensed.  Wear nightly 3.  Recommend OTC Voltaren 1% gel 4.  Compression ankle sleeve dispensed.  Wear daily 5.  Continue OTC Motrin as needed 6.  Return to clinic in 4 weeks  *Works at [employer] [facility]  Edrick Kins, DPM [facility]  Dr. Edrick Kins, DPM    [address],  [address]                Office [phone]  Fax [phone]

## 2020-04-30 MED FILL — AMPHETAMINE-DEXTROAMPHETAMI: 30 | 18 days supply | Qty: 45 | Fill #0

## 2020-05-25 NOTE — Telephone Encounter (Signed)
Gel diclofenac does not need a prescription. It is now available at any pharmacy over-the-counter. - Dr. Amalia Hailey

## 2020-05-27 ENCOUNTER — Other Ambulatory Visit (HOSPITAL_COMMUNITY): Payer: Self-pay | Admitting: Specialist

## 2020-05-27 MED FILL — AMPHETAMINE-DEXTROAMPHETAMI: 30 | 90 days supply | Qty: 225 | Fill #0

## 2020-06-07 ENCOUNTER — Inpatient Hospital Stay: Payer: 59

## 2020-06-07 ENCOUNTER — Other Ambulatory Visit: Payer: Self-pay

## 2020-06-12 ENCOUNTER — Encounter (HOSPITAL_BASED_OUTPATIENT_CLINIC_OR_DEPARTMENT_OTHER): Payer: Self-pay | Admitting: Emergency Medicine

## 2020-06-12 ENCOUNTER — Other Ambulatory Visit: Payer: Self-pay

## 2020-06-12 ENCOUNTER — Emergency Department (HOSPITAL_BASED_OUTPATIENT_CLINIC_OR_DEPARTMENT_OTHER)
Admission: EM | Admit: 2020-06-12 | Discharge: 2020-06-12 | Disposition: A | Discharge status: Home / Self Care | Payer: 59 | Attending: Emergency Medicine | Admitting: Emergency Medicine

## 2020-06-12 DIAGNOSIS — J45909 Unspecified asthma, uncomplicated: Secondary | ICD-10-CM | POA: Insufficient documentation

## 2020-06-12 DIAGNOSIS — U071 COVID-19: Secondary | ICD-10-CM | POA: Insufficient documentation

## 2020-06-12 DIAGNOSIS — Z91040 Latex allergy status: Secondary | ICD-10-CM | POA: Diagnosis not present

## 2020-06-12 DIAGNOSIS — Z8616 Personal history of COVID-19: Secondary | ICD-10-CM

## 2020-06-12 DIAGNOSIS — R519 Headache, unspecified: Secondary | ICD-10-CM | POA: Diagnosis present

## 2020-06-12 HISTORY — DX: Personal history of COVID-19: Z86.16

## 2020-06-12 LAB — RESP PANEL BY RT-PCR (FLU A&B, COVID) ARPGX2
Influenza A by PCR: NEGATIVE
Influenza B by PCR: NEGATIVE
SARS Coronavirus 2 by RT PCR: POSITIVE — AB

## 2020-06-12 MED ORDER — ONDANSETRON HCL 4 MG/2ML IJ SOLN
4.0000 mg | Freq: Once | INTRAMUSCULAR | Status: AC
  Administered 2020-06-12: 4 mg via INTRAVENOUS
  Filled 2020-06-12: qty 2

## 2020-06-12 MED ORDER — ONDANSETRON 8 MG PO TBDP: 8.0000 mg | ORAL_TABLET | Freq: Three times a day (TID) | ORAL | 0 refills | Status: DC | PRN

## 2020-06-12 MED ORDER — SODIUM CHLORIDE 0.9 % IV BOLUS
1000.0000 mL | Freq: Once | INTRAVENOUS | Status: AC
  Administered 2020-06-12: 1000 mL via INTRAVENOUS

## 2020-06-12 MED ORDER — KETOROLAC TROMETHAMINE 15 MG/ML IJ SOLN
15.0000 mg | Freq: Once | INTRAMUSCULAR | Status: AC
  Administered 2020-06-12: 15 mg via INTRAVENOUS
  Filled 2020-06-12: qty 1

## 2020-06-12 MED ORDER — METOCLOPRAMIDE HCL 5 MG/ML IJ SOLN
10.0000 mg | Freq: Once | INTRAMUSCULAR | Status: AC
  Administered 2020-06-12: 10 mg via INTRAVENOUS

## 2020-06-12 NOTE — ED Triage Notes (Signed)
Reports headaches, fever tmax 100.2, n/v since Thursday. States feeling awful.

## 2020-06-12 NOTE — ED Provider Notes (Signed)
[facility] Provider Note: Lowella Dell, MD, FACEP  CSN: [id number] MRN: [id number] ARRIVAL: 06/12/20 at 0526 ROOM: MH01/MH01   CHIEF COMPLAINT  covid symptoms   HISTORY OF PRESENT ILLNESS  06/12/20 5:39 AM Misty [name] is a 44 y.o. female with 2 days of flulike symptoms.  Specifically she has had headache, body aches, nasal congestion, scratchy throat for 1 day, nausea and vomiting.  She has had a decreased appetite and decreased oral intake.  She has not had a cough, shortness of breath or diarrhea.  She states she "feels terrible" and rates her pain as a 10 out of 10 despite taking acetaminophen, ibuprofen, TheraFlu and Mucinex.  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   Past Medical History:  Diagnosis Date  . Anemia   . Asthma   . Constipation   . Dysuria   . Frequency of urination   . GERD (gastroesophageal reflux disease)   . Hematuria   . History of kidney stones   . Nocturia   . Right ureteral stone   . Thyroid disease   . Urgency of urination     Past Surgical History:  Procedure Laterality Date  . CYSTO W/ RIGHT URETEROSCOPY  2006  . CYSTO/ RIGHT URETERAL STENT PLACEMENT  07-29-2009   LARGE RIGHT RENAL CALCULUS  . CYSTOLITHOLAPAXY OF LARGE BLADDER CALCULI/   RIGHT URETERAL STENT EXCHANGE  04-15-2010  . CYSTOSCOPY WITH STENT PLACEMENT  07/04/2012   Procedure: CYSTOSCOPY WITH STENT PLACEMENT;  Surgeon: Marcine Matar, MD;  Location: [facility];  Service: Urology;  Laterality: Right;  rt dbl j stent placement   . CYSTOSCOPY/RETROGRADE/URETEROSCOPY/STONE EXTRACTION WITH BASKET  07/25/2012   Procedure: CYSTOSCOPY/RETROGRADE/URETEROSCOPY/STONE EXTRACTION WITH BASKET;  Surgeon: Marcine Matar, MD;  Location: [facility];  Service: Urology;  Laterality: Right;  . EXTRACORPOREAL SHOCK WAVE LITHOTRIPSY  08-02-2009   RIGHT  . HOLMIUM  LASER APPLICATION  07/25/2012   Procedure: HOLMIUM LASER APPLICATION;  Surgeon: Marcine Matar, MD;  Location: [facility];  Service: Urology;  Laterality: Right;  . LAPAROSCOPIC TUBAL LIGATION Bilateral 01/17/2013   Procedure: LAPAROSCOPIC TUBAL LIGATION;  Surgeon: Bing Plume, MD;  Location: [facility];  Service: Gynecology;  Laterality: Bilateral;  1hr OR time     Family History  Problem Relation Age of Onset  . Breast cancer Maternal Aunt   . Breast cancer Maternal Grandmother   . Esophageal cancer Maternal Uncle   . Stomach cancer Neg Hx   . Colon cancer Neg Hx   . Pancreatic cancer Neg Hx     Social History   Tobacco Use  . Smoking status: Never Smoker  . Smokeless tobacco: Never Used  Vaping Use  . Vaping Use: Never used  Substance Use Topics  . Alcohol use: Yes    Comment: occ  . Drug use: No    Prior to Admission medications   Medication Sig Start Date End Date Taking? Authorizing Provider  ALPRAZolam Prudy Feeler) 0.5 MG tablet Take 0.5 mg by mouth.    [provider]  ALPRAZolam Prudy Feeler) 1 MG tablet Take 1 mg by mouth daily as needed for anxiety.     [provider]  amphetamine-dextroamphetamine (ADDERALL) 15 MG tablet Take 15 mg by mouth daily.    [provider]  montelukast (SINGULAIR) 10 MG tablet Take 1 tablet by mouth daily. 06/28/15   [provider]  omeprazole (  PRILOSEC) 40 MG capsule Take 1 capsule (40 mg total) by mouth daily before breakfast. 05/13/19   Milus Banister, MD  ondansetron (ZOFRAN ODT) 8 MG disintegrating tablet Take 1 tablet (8 mg total) by mouth every 8 (eight) hours as needed for nausea or vomiting. 06/12/20   Avril Busser, MD    Allergies Dilaudid [hydromorphone hcl], Ceftriaxone sodium, and Latex   REVIEW OF SYSTEMS  Negative except as noted here or in the History of Present Illness.   PHYSICAL EXAMINATION  Initial Vital Signs Blood pressure 132/88, pulse 92, temperature 99.3 F (37.4  C), last menstrual period 06/02/2020, SpO2 98 %.  Examination General: Well-developed, well-nourished female in no acute distress; appearance consistent with age of record HENT: normocephalic; atraumatic; nasal congestion; no pharyngeal erythema or exudate Eyes: pupils equal, round and reactive to light; extraocular muscles intact; arcus senilis bilaterally Neck: supple Heart: regular rate and rhythm Lungs: clear to auscultation bilaterally Abdomen: soft; nondistended; nontender; bowel sounds present Extremities: No deformity; full range of motion; pulses normal Neurologic: Awake, alert and oriented; motor function intact in all extremities and symmetric; no facial droop Skin: Warm and dry Psychiatric: Flat affect   RESULTS  Summary of this visit's results, reviewed and interpreted by myself:   EKG Interpretation  Date/Time:    Ventricular Rate:    PR Interval:    QRS Duration:   QT Interval:    QTC Calculation:   R Axis:     Text Interpretation:        Laboratory Studies: Results for orders placed or performed during the hospital encounter of 06/12/20 (from the past 24 hour(s))  Resp Panel by RT-PCR (Flu A&B, Covid) Nasopharyngeal Swab     Status: Abnormal   Collection Time: 06/12/20  5:39 AM   Specimen: Nasopharyngeal Swab; Nasopharyngeal(NP) swabs in vial transport medium  Result Value Ref Range   SARS Coronavirus 2 by RT PCR POSITIVE (A) NEGATIVE   Influenza A by PCR NEGATIVE NEGATIVE   Influenza B by PCR NEGATIVE NEGATIVE   Imaging Studies: No results found.  ED COURSE and MDM  Nursing notes, initial and subsequent vitals signs, including pulse oximetry, reviewed and interpreted by myself.  Vitals:   06/12/20 0536 06/12/20 0545 06/12/20 0600  BP: 132/88 132/80 128/78  Pulse: 92 90 88  Resp:  17 17  Temp: 99.3 F (37.4 C) 99.3 F (37.4 C)   SpO2: 98% 100% 99%   Medications  sodium chloride 0.9 % bolus 1,000 mL (0 mLs Intravenous Stopped 06/12/20 0639)   ondansetron (ZOFRAN) injection 4 mg (4 mg Intravenous Given 06/12/20 0555)  ketorolac (TORADOL) 15 MG/ML injection 15 mg (15 mg Intravenous Given 06/12/20 0555)   6:56 AM Nausea relieved, headache improved after IV fluids and medications.  We are unable to provide the Regeneron monoclonal antibody infusion due to being out of stock.  Patient does not meet any inpatient criteria at this time.   PROCEDURES  Procedures   ED DIAGNOSES     ICD-10-CM   1. COVID-19 virus infection  U07.1        Manmeet Arzola, MD 06/12/20 0700

## 2020-06-13 ENCOUNTER — Encounter: Payer: Self-pay | Admitting: Physician Assistant

## 2020-06-13 DIAGNOSIS — E669 Obesity, unspecified: Secondary | ICD-10-CM | POA: Insufficient documentation

## 2020-06-13 DIAGNOSIS — Z6830 Body mass index (BMI) 30.0-30.9, adult: Secondary | ICD-10-CM | POA: Insufficient documentation

## 2020-06-22 ENCOUNTER — Other Ambulatory Visit (HOSPITAL_COMMUNITY): Payer: Self-pay | Admitting: Specialist

## 2020-06-23 MED FILL — ALPRAZOLAM 1 MG TABS: 1 | 90 days supply | Qty: 360 | Fill #0

## 2020-06-25 DIAGNOSIS — F900 Attention-deficit hyperactivity disorder, predominantly inattentive type: Secondary | ICD-10-CM | POA: Diagnosis not present

## 2020-07-06 ENCOUNTER — Other Ambulatory Visit: Payer: Self-pay | Admitting: Gastroenterology

## 2020-07-06 MED FILL — OMEPRAZOLE 40 MG CPDR: 40 | 30 days supply | Qty: 30 | Fill #0

## 2020-07-22 MED FILL — OMEPRAZOLE 40 MG CPDR: 40 | 30 days supply | Qty: 30 | Fill #0

## 2020-08-27 ENCOUNTER — Other Ambulatory Visit (HOSPITAL_COMMUNITY): Payer: Self-pay | Admitting: Specialist

## 2020-08-28 MED FILL — AMPHETAMINE-DEXTROAMPHETAMI: 30 | 90 days supply | Qty: 225 | Fill #0

## 2020-09-08 ENCOUNTER — Other Ambulatory Visit (HOSPITAL_COMMUNITY): Payer: Self-pay | Admitting: Emergency Medicine

## 2020-09-11 ENCOUNTER — Other Ambulatory Visit (HOSPITAL_COMMUNITY): Payer: Self-pay | Admitting: Emergency Medicine

## 2020-09-11 MED FILL — NITROFURANTOIN MONO-MCR 100: 100 | 5 days supply | Qty: 10 | Fill #0

## 2020-09-11 MED FILL — FLUCONAZOLE 150 MG TABS: 150 | 1 days supply | Qty: 1 | Fill #0

## 2020-09-21 ENCOUNTER — Other Ambulatory Visit (HOSPITAL_COMMUNITY): Payer: Self-pay

## 2020-09-22 ENCOUNTER — Other Ambulatory Visit (HOSPITAL_COMMUNITY): Payer: Self-pay

## 2020-09-22 MED ORDER — ALPRAZOLAM 1 MG PO TABS
1.0000 mg | ORAL_TABLET | Freq: Four times a day (QID) | ORAL | 0 refills | Status: DC | PRN
  Filled 2020-09-22: qty 360, 90d supply, fill #0

## 2020-10-14 ENCOUNTER — Other Ambulatory Visit (HOSPITAL_COMMUNITY): Payer: Self-pay

## 2020-11-16 DIAGNOSIS — G43909 Migraine, unspecified, not intractable, without status migrainosus: Secondary | ICD-10-CM | POA: Diagnosis not present

## 2020-11-19 ENCOUNTER — Other Ambulatory Visit (HOSPITAL_COMMUNITY): Payer: Self-pay

## 2020-11-19 DIAGNOSIS — Z049 Encounter for examination and observation for unspecified reason: Secondary | ICD-10-CM | POA: Diagnosis not present

## 2020-11-19 DIAGNOSIS — G43719 Chronic migraine without aura, intractable, without status migrainosus: Secondary | ICD-10-CM | POA: Diagnosis not present

## 2020-11-19 MED ORDER — ZONISAMIDE 25 MG PO CAPS
100.0000 mg | ORAL_CAPSULE | Freq: Every evening | ORAL | 1 refills | Status: DC
  Filled 2020-11-19: qty 120, 30d supply, fill #0

## 2020-11-19 MED ORDER — BACLOFEN 10 MG PO TABS
10.0000 mg | ORAL_TABLET | Freq: Two times a day (BID) | ORAL | 0 refills | Status: DC | PRN
  Filled 2020-11-19: qty 20, 35d supply, fill #0

## 2020-11-19 MED FILL — Omeprazole Cap Delayed Release 40 MG: ORAL | 30 days supply | Qty: 30 | Fill #0 | Status: AC

## 2020-11-23 ENCOUNTER — Other Ambulatory Visit (HOSPITAL_COMMUNITY): Payer: Self-pay

## 2020-11-23 MED ORDER — AMPHETAMINE-DEXTROAMPHETAMINE 30 MG PO TABS
ORAL_TABLET | ORAL | 0 refills | Status: DC
  Filled 2020-11-25: qty 225, 90d supply, fill #0

## 2020-11-25 ENCOUNTER — Other Ambulatory Visit (HOSPITAL_COMMUNITY): Payer: Self-pay

## 2020-12-14 ENCOUNTER — Other Ambulatory Visit (HOSPITAL_COMMUNITY): Payer: Self-pay

## 2020-12-14 MED ORDER — CARESTART COVID-19 HOME TEST VI KIT
PACK | 0 refills | Status: DC
  Filled 2020-12-14: qty 4, 4d supply, fill #0

## 2020-12-21 DIAGNOSIS — F4310 Post-traumatic stress disorder, unspecified: Secondary | ICD-10-CM | POA: Diagnosis not present

## 2020-12-22 ENCOUNTER — Other Ambulatory Visit (HOSPITAL_COMMUNITY): Payer: Self-pay

## 2020-12-22 MED ORDER — ALPRAZOLAM 1 MG PO TABS
1.0000 mg | ORAL_TABLET | Freq: Four times a day (QID) | ORAL | 0 refills | Status: DC | PRN
  Filled 2020-12-22: qty 360, 90d supply, fill #0

## 2021-01-07 ENCOUNTER — Other Ambulatory Visit (HOSPITAL_COMMUNITY): Payer: Self-pay

## 2021-01-07 MED FILL — Omeprazole Cap Delayed Release 40 MG: ORAL | 30 days supply | Qty: 30 | Fill #1 | Status: AC

## 2021-02-02 ENCOUNTER — Other Ambulatory Visit (HOSPITAL_COMMUNITY): Payer: Self-pay

## 2021-02-02 ENCOUNTER — Other Ambulatory Visit: Payer: Self-pay | Admitting: Obstetrics and Gynecology

## 2021-02-02 DIAGNOSIS — Z130 Encounter for screening for diseases of the blood and blood-forming organs and certain disorders involving the immune mechanism: Secondary | ICD-10-CM | POA: Diagnosis not present

## 2021-02-02 DIAGNOSIS — Z6830 Body mass index (BMI) 30.0-30.9, adult: Secondary | ICD-10-CM | POA: Diagnosis not present

## 2021-02-02 DIAGNOSIS — N762 Acute vulvitis: Secondary | ICD-10-CM | POA: Diagnosis not present

## 2021-02-02 DIAGNOSIS — R109 Unspecified abdominal pain: Secondary | ICD-10-CM | POA: Diagnosis not present

## 2021-02-02 DIAGNOSIS — Z113 Encounter for screening for infections with a predominantly sexual mode of transmission: Secondary | ICD-10-CM | POA: Diagnosis not present

## 2021-02-02 DIAGNOSIS — Z1389 Encounter for screening for other disorder: Secondary | ICD-10-CM | POA: Diagnosis not present

## 2021-02-02 DIAGNOSIS — Z01411 Encounter for gynecological examination (general) (routine) with abnormal findings: Secondary | ICD-10-CM | POA: Diagnosis not present

## 2021-02-02 DIAGNOSIS — Z01419 Encounter for gynecological examination (general) (routine) without abnormal findings: Secondary | ICD-10-CM | POA: Diagnosis not present

## 2021-02-02 DIAGNOSIS — R350 Frequency of micturition: Secondary | ICD-10-CM | POA: Diagnosis not present

## 2021-02-02 DIAGNOSIS — Z1231 Encounter for screening mammogram for malignant neoplasm of breast: Secondary | ICD-10-CM

## 2021-02-02 DIAGNOSIS — Z124 Encounter for screening for malignant neoplasm of cervix: Secondary | ICD-10-CM | POA: Diagnosis not present

## 2021-02-02 MED ORDER — FLUCONAZOLE 150 MG PO TABS
ORAL_TABLET | ORAL | 0 refills | Status: DC
  Filled 2021-02-02: qty 2, 3d supply, fill #0

## 2021-02-02 MED ORDER — SULFAMETHOXAZOLE-TRIMETHOPRIM 800-160 MG PO TABS
ORAL_TABLET | ORAL | 0 refills | Status: DC
  Filled 2021-02-02: qty 20, 10d supply, fill #0

## 2021-02-04 ENCOUNTER — Other Ambulatory Visit (HOSPITAL_COMMUNITY): Payer: Self-pay

## 2021-02-04 MED ORDER — METRONIDAZOLE 500 MG PO TABS
ORAL_TABLET | ORAL | 0 refills | Status: DC
  Filled 2021-02-04: qty 14, 7d supply, fill #0

## 2021-02-11 DIAGNOSIS — A599 Trichomoniasis, unspecified: Secondary | ICD-10-CM | POA: Diagnosis not present

## 2021-02-22 ENCOUNTER — Other Ambulatory Visit (HOSPITAL_COMMUNITY): Payer: Self-pay

## 2021-02-22 MED ORDER — AMPHETAMINE-DEXTROAMPHETAMINE 30 MG PO TABS
ORAL_TABLET | ORAL | 0 refills | Status: DC
  Filled 2021-02-23: qty 75, 30d supply, fill #0

## 2021-02-23 ENCOUNTER — Other Ambulatory Visit (HOSPITAL_COMMUNITY): Payer: Self-pay

## 2021-03-21 ENCOUNTER — Ambulatory Visit
Admission: RE | Admit: 2021-03-21 | Discharge: 2021-03-21 | Disposition: A | Discharge status: Home / Self Care | Payer: 59 | Source: Ambulatory Visit | Attending: Obstetrics and Gynecology | Admitting: Obstetrics and Gynecology

## 2021-03-21 ENCOUNTER — Other Ambulatory Visit (HOSPITAL_COMMUNITY): Payer: Self-pay

## 2021-03-21 ENCOUNTER — Other Ambulatory Visit: Payer: Self-pay

## 2021-03-21 DIAGNOSIS — M25532 Pain in left wrist: Secondary | ICD-10-CM | POA: Diagnosis not present

## 2021-03-21 DIAGNOSIS — Z1231 Encounter for screening mammogram for malignant neoplasm of breast: Secondary | ICD-10-CM | POA: Diagnosis not present

## 2021-03-21 MED ORDER — ALPRAZOLAM 1 MG PO TABS
1.0000 mg | ORAL_TABLET | Freq: Four times a day (QID) | ORAL | 0 refills | Status: DC | PRN
  Filled 2021-03-25: qty 360, 90d supply, fill #0

## 2021-03-21 MED ORDER — AMPHETAMINE-DEXTROAMPHETAMINE 20 MG PO TABS
20.0000 mg | ORAL_TABLET | Freq: Three times a day (TID) | ORAL | 0 refills | Status: DC
  Filled 2021-03-25: qty 270, 90d supply, fill #0

## 2021-03-24 ENCOUNTER — Other Ambulatory Visit (HOSPITAL_COMMUNITY): Payer: Self-pay

## 2021-03-25 ENCOUNTER — Other Ambulatory Visit (HOSPITAL_COMMUNITY): Payer: Self-pay

## 2021-03-25 ENCOUNTER — Other Ambulatory Visit: Payer: Self-pay | Admitting: Obstetrics and Gynecology

## 2021-03-25 DIAGNOSIS — R928 Other abnormal and inconclusive findings on diagnostic imaging of breast: Secondary | ICD-10-CM

## 2021-04-04 ENCOUNTER — Other Ambulatory Visit (HOSPITAL_COMMUNITY): Payer: Self-pay

## 2021-04-04 MED ORDER — TRIAMCINOLONE ACETONIDE 0.1 % EX OINT
TOPICAL_OINTMENT | CUTANEOUS | 0 refills | Status: DC
  Filled 2021-04-04: qty 30, 14d supply, fill #0

## 2021-04-04 MED ORDER — FLUCONAZOLE 150 MG PO TABS
ORAL_TABLET | ORAL | 0 refills | Status: DC
  Filled 2021-04-04: qty 2, 4d supply, fill #0

## 2021-04-05 ENCOUNTER — Other Ambulatory Visit (HOSPITAL_COMMUNITY): Payer: Self-pay

## 2021-04-05 MED FILL — Omeprazole Cap Delayed Release 40 MG: ORAL | 30 days supply | Qty: 30 | Fill #2 | Status: AC

## 2021-04-15 ENCOUNTER — Other Ambulatory Visit: Payer: Self-pay | Admitting: Obstetrics and Gynecology

## 2021-04-15 ENCOUNTER — Other Ambulatory Visit: Payer: Self-pay

## 2021-04-15 ENCOUNTER — Ambulatory Visit
Admission: RE | Admit: 2021-04-15 | Discharge: 2021-04-15 | Disposition: A | Discharge status: Home / Self Care | Payer: 59 | Source: Ambulatory Visit | Attending: Obstetrics and Gynecology | Admitting: Obstetrics and Gynecology

## 2021-04-15 DIAGNOSIS — R922 Inconclusive mammogram: Secondary | ICD-10-CM | POA: Diagnosis not present

## 2021-04-15 DIAGNOSIS — R928 Other abnormal and inconclusive findings on diagnostic imaging of breast: Secondary | ICD-10-CM

## 2021-04-15 DIAGNOSIS — N6310 Unspecified lump in the right breast, unspecified quadrant: Secondary | ICD-10-CM

## 2021-04-15 DIAGNOSIS — N6314 Unspecified lump in the right breast, lower inner quadrant: Secondary | ICD-10-CM | POA: Diagnosis not present

## 2021-04-18 ENCOUNTER — Other Ambulatory Visit (HOSPITAL_COMMUNITY): Payer: Self-pay

## 2021-04-18 MED ORDER — MONTELUKAST SODIUM 10 MG PO TABS
ORAL_TABLET | ORAL | 0 refills | Status: DC
  Filled 2021-04-18: qty 90, 90d supply, fill #0

## 2021-04-19 NOTE — H&P (Signed)
PREOPERATIVE H&P  Chief Complaint: LT WRIST GANGLION and DEQUERVAINS  HPI: Misty [name] is a 45 y.o. female who presents with a diagnosis of LT WRIST GANGLION and DEQUERVAINS. Symptoms are rated as moderate to severe, and have been worsening.  This is significantly impairing activities of daily living.  She has elected for surgical management.   Past Medical History:  Diagnosis Date   Anemia    Asthma    BMI 30.0-30.9,adult    Constipation    Dysuria    Frequency of urination    GERD (gastroesophageal reflux disease)    Hematuria    History of kidney stones    Nocturia    Right ureteral stone    Thyroid disease    Urgency of urination    Past Surgical History:  Procedure Laterality Date   CYSTO W/ RIGHT URETEROSCOPY  2006   CYSTO/ RIGHT URETERAL STENT PLACEMENT  07-29-2009   LARGE RIGHT RENAL CALCULUS   CYSTOLITHOLAPAXY OF LARGE BLADDER CALCULI/   RIGHT URETERAL STENT EXCHANGE  04-15-2010   CYSTOSCOPY WITH STENT PLACEMENT  07/04/2012   Procedure: CYSTOSCOPY WITH STENT PLACEMENT;  Surgeon: Franchot Gallo, MD;  Location: [facility];  Service: Urology;  Laterality: Right;  rt dbl j stent placement    CYSTOSCOPY/RETROGRADE/URETEROSCOPY/STONE EXTRACTION WITH BASKET  07/25/2012   Procedure: CYSTOSCOPY/RETROGRADE/URETEROSCOPY/STONE EXTRACTION WITH BASKET;  Surgeon: Franchot Gallo, MD;  Location: [facility];  Service: Urology;  Laterality: Right;   EXTRACORPOREAL SHOCK WAVE LITHOTRIPSY  08-02-2009   RIGHT   HOLMIUM LASER APPLICATION  07/24/9561   Procedure: HOLMIUM LASER APPLICATION;  Surgeon: Franchot Gallo, MD;  [facility]  Service: Urology;  Laterality: Right;   LAPAROSCOPIC TUBAL LIGATION Bilateral 01/17/2013   Procedure: LAPAROSCOPIC TUBAL LIGATION;  Surgeon: Melina Schools, MD;  Location: [facility];  Service: Gynecology;  Laterality: Bilateral;  1hr OR time    Social History   Socioeconomic History   Marital  status: Married    Spouse name: Not on file   Number of children: Not on file   Years of education: Not on file   Highest education level: Not on file  Occupational History   Not on file  Tobacco Use   Smoking status: Never   Smokeless tobacco: Never  Vaping Use   Vaping Use: Never used  Substance and Sexual Activity   Alcohol use: Yes    Comment: occ   Drug use: No   Sexual activity: Not on file  Other Topics Concern   Not on file  Social History Narrative   Not on file   Social Determinants of Health   Financial Resource Strain: Not on file  Food Insecurity: Not on file  Transportation Needs: Not on file  Physical Activity: Not on file  Stress: Not on file  Social Connections: Not on file   Family History  Problem Relation Age of Onset   Breast cancer Maternal Aunt    Breast cancer Maternal Grandmother    Esophageal cancer Maternal Uncle    Stomach cancer Neg Hx    Colon cancer Neg Hx    Pancreatic cancer Neg Hx    Allergies  Allergen Reactions   Dilaudid [Hydromorphone Hcl] Shortness Of Breath and Nausea And Vomiting    Pt has takes percocet at home (see med rec).   Ceftriaxone Sodium Other (See Comments)    Hair loss   Latex Rash   Prior to Admission medications   Medication Sig Start Date End  Date Taking? Authorizing Provider  ALPRAZolam Duanne Moron) 0.5 MG tablet Take 0.5 mg by mouth.    [provider]  ALPRAZolam Duanne Moron) 1 MG tablet Take 1 mg by mouth daily as needed for anxiety.     [provider]  ALPRAZolam (XANAX) 1 MG tablet TAKE 1 TABLET BY MOUTH 4 TIMES DAILY AS NEEDED.. 09/22/20     ALPRAZolam (XANAX) 1 MG tablet Take 1 tablet (1 mg total) by mouth 4 (four) times daily as needed (fill 03/22/21) 03/22/21     amphetamine-dextroamphetamine (ADDERALL) 15 MG tablet Take 15 mg by mouth daily.    [provider]  amphetamine-dextroamphetamine (ADDERALL) 20 MG tablet Take 1 tablet (20 mg total) by mouth 3 (three) times daily (fill  03/25/21) 03/25/21     amphetamine-dextroamphetamine (ADDERALL) 30 MG tablet TAKE 1 TABLET BY MOUTH IN THE MORNING, 1 TABLET AT NOON, AND HALF A TABLET AT [facility] 08/27/20 02/23/21  Chucky May, MD  amphetamine-dextroamphetamine (ADDERALL) 30 MG tablet TAKE 1 TABLET BY MOUTH EVERY MORNING, 1 TABLET AT NOON AND 1/2 TABLET AT [facility] 05/27/20 11/23/20  Chucky May, MD  amphetamine-dextroamphetamine (ADDERALL) 30 MG tablet TAKE 1 TABLET BY MOUTH IN THE MORNING, ONE AT NOON, AND ONE-HALF AT 4PM. MAY FILL ON OR AFTER 11/25/20. 11/25/20     amphetamine-dextroamphetamine (ADDERALL) 30 MG tablet TAKE 1 TABLET BY MOUTH IN THE MORNING, ONE AT NOON, AND ONE-HALF AT 4PM.  Do not fill before 02/23/21 02/23/21     baclofen (LIORESAL) 10 MG tablet Take 1 tablet (10 mg total) by mouth 2 (two) times daily as needed Limit 1-2 treatments per week 11/19/20     COVID-19 At Home Antigen Test (CARESTART COVID-19 HOME TEST) KIT Use as directed 12/14/20   Edmon Crape, RPH  fluconazole (DIFLUCAN) 150 MG tablet TAKE BY MOUTH ONCE 09/08/20 09/08/21  Margarita Mail, PA-C  fluconazole (DIFLUCAN) 150 MG tablet Take 1 tablet by mouth now, then take 1 tablet in 3 days if symptoms persist. 04/04/21     metroNIDAZOLE (FLAGYL) 500 MG tablet Take 1 tablet by mouth every 12 hours for 7 days. 02/04/21     montelukast (SINGULAIR) 10 MG tablet Take 1 tablet by mouth daily. 06/28/15   [provider]  montelukast (SINGULAIR) 10 MG tablet Take 1 tablet by mouth every evening 04/18/21     nitrofurantoin, macrocrystal-monohydrate, (MACROBID) 100 MG capsule TAKE 1 CAPSULE BY MOUTH TWICE DAILY 09/11/20 09/11/21  Margarita Mail, PA-C  omeprazole (PRILOSEC) 40 MG capsule TAKE 1 CAPSULE BY MOUTH ONCE DAILY BEFORE BREAKFAST 07/06/20 07/06/21  Milus Banister, MD  ondansetron (ZOFRAN ODT) 8 MG disintegrating tablet Take 1 tablet (8 mg total) by mouth every 8 (eight) hours as needed for nausea or vomiting. 06/12/20   Molpus, John, MD  sulfamethoxazole-trimethoprim  (BACTRIM DS) 800-160 MG tablet Take 1 tablet by mouth 2 times a day for 10 days. 02/02/21     triamcinolone ointment (KENALOG) 0.1 % APPLY A THIN LAYER TO THE AFFECTED AREA(S) OF SKIN 2 TIMES PER DAY 04/04/21     zonisamide (ZONEGRAN) 25 MG capsule Take 4 capsules (100 mg total) by mouth every evening. 11/19/20        Positive ROS: All other systems have been reviewed and were otherwise negative with the exception of those mentioned in the HPI and as above.  Physical Exam: General: Alert, no acute distress Cardiovascular: No pedal edema Respiratory: No cyanosis, no use of accessory musculature GI: No organomegaly, abdomen is soft and non-tender Skin:  No lesions in the area of chief complaint Neurologic: Sensation intact distally Psychiatric: Patient is competent for consent with normal mood and affect Lymphatic: No axillary or cervical lymphadenopathy  MUSCULOSKELETAL: TTP radial aspect left wrist near 1st dorsal compartment, intact ROM, + Finkelstein, NVI. Hard round mobile nodule in above area that feels similar to a ganglion cyst.   Imaging: U/S confirmed the nodule is a ganglion cyst that appears to be coming from the tendon sheath of the 1st doral compartment close to the nerve.    Assessment: LT WRIST GANGLION and DEQUERVAINS  Plan: Plan for Procedure(s): MINOR RELEASE DORSAL COMPARTMENT (DEQUERVAINS) WITH GANGLION EXCISION  The risks benefits and alternatives were discussed with the patient including but not limited to the risks of nonoperative treatment, versus surgical intervention including infection, bleeding, nerve injury,  blood clots, cardiopulmonary complications, morbidity, mortality, among others, and they were willing to proceed.   Weightbearing: WBAT LUE Orthopedic devices: none Showering: POD 3 Dressing: reinforce PRN Medicines: Norco 10, Meloxicam, Zofran  Discharge: home Follow up: 2 weeks    Alisa Graff Office [phone] 04/19/2021 10:11 AM

## 2021-04-20 ENCOUNTER — Encounter (HOSPITAL_BASED_OUTPATIENT_CLINIC_OR_DEPARTMENT_OTHER): Payer: Self-pay | Admitting: Orthopedic Surgery

## 2021-04-21 ENCOUNTER — Encounter (HOSPITAL_BASED_OUTPATIENT_CLINIC_OR_DEPARTMENT_OTHER): Payer: Self-pay | Admitting: Orthopedic Surgery

## 2021-04-21 ENCOUNTER — Other Ambulatory Visit: Payer: Self-pay

## 2021-04-21 NOTE — Progress Notes (Signed)
Spoke w/ via phone for pre-op interview---pt Lab needs dos----  urine preg             Lab results------ no COVID test -----patient states asymptomatic no test needed Arrive at ------- 0630 on 04-26-2021 NPO after MN NO Solid Food.  Clear liquids from MN until--- 0530 Med rec completed Medications to take morning of surgery ----- prilosec Diabetic medication ----- n/a Patient instructed no nail polish to be worn day of surgery Patient instructed to bring photo id and insurance card day of surgery Patient aware to have Driver (ride ) / caregiver  for 24 hours after surgery --mother, delaine bowens Patient Special Instructions ----- n/a Pre-Op special Istructions ----- n/a Patient verbalized understanding of instructions that were given at this phone interview. Patient denies shortness of breath, chest pain, fever, cough at this phone interview.

## 2021-04-25 NOTE — Anesthesia Preprocedure Evaluation (Addendum)
Anesthesia Evaluation  Patient identified by MRN, date of birth, ID band Patient awake    Reviewed: Allergy & Precautions, NPO status , Patient's Chart, lab work & pertinent test results  Airway Mallampati: III  TM Distance: >3 FB Neck ROM: Full    Dental  (+) Partial Upper   Pulmonary neg pulmonary ROS,    Pulmonary exam normal breath sounds clear to auscultation       Cardiovascular negative cardio ROS Normal cardiovascular exam Rhythm:Regular Rate:Normal     Neuro/Psych PSYCHIATRIC DISORDERS Anxiety negative neurological ROS     GI/Hepatic Neg liver ROS, GERD  Controlled and Medicated,  Endo/Other  negative endocrine ROS  Renal/GU negative Renal ROS     Musculoskeletal negative musculoskeletal ROS (+)   Abdominal   Peds  (+) ADHD Hematology negative hematology ROS (+)   Anesthesia Other Findings LEFT WRIST GANGLION DEQUERVAINS  Reproductive/Obstetrics hcg negative                            Anesthesia Physical Anesthesia Plan  ASA: 2  Anesthesia Plan: General   Post-op Pain Management:    Induction: Intravenous  PONV Risk Score and Plan: 3 and Dexamethasone, Ondansetron, Midazolam and Treatment may vary due to age or medical condition  Airway Management Planned: LMA  Additional Equipment:   Intra-op Plan:   Post-operative Plan: Extubation in OR  Informed Consent: I have reviewed the patients History and Physical, chart, labs and discussed the procedure including the risks, benefits and alternatives for the proposed anesthesia with the patient or authorized representative who has indicated his/her understanding and acceptance.     Dental advisory given  Plan Discussed with: CRNA  Anesthesia Plan Comments:         Anesthesia Quick Evaluation

## 2021-04-26 ENCOUNTER — Ambulatory Visit (HOSPITAL_BASED_OUTPATIENT_CLINIC_OR_DEPARTMENT_OTHER)
Admission: RE | Admit: 2021-04-26 | Discharge: 2021-04-26 | Disposition: A | Discharge status: Home / Self Care | Payer: 59 | Attending: Orthopedic Surgery | Admitting: Orthopedic Surgery

## 2021-04-26 ENCOUNTER — Ambulatory Visit (HOSPITAL_BASED_OUTPATIENT_CLINIC_OR_DEPARTMENT_OTHER): Payer: 59 | Admitting: Anesthesiology

## 2021-04-26 ENCOUNTER — Other Ambulatory Visit (HOSPITAL_COMMUNITY): Payer: Self-pay

## 2021-04-26 ENCOUNTER — Encounter (HOSPITAL_BASED_OUTPATIENT_CLINIC_OR_DEPARTMENT_OTHER)
Admission: RE | Disposition: A | Discharge status: Home / Self Care | Payer: Self-pay | Source: Home / Self Care | Attending: Orthopedic Surgery

## 2021-04-26 ENCOUNTER — Encounter (HOSPITAL_BASED_OUTPATIENT_CLINIC_OR_DEPARTMENT_OTHER): Payer: Self-pay | Admitting: Orthopedic Surgery

## 2021-04-26 ENCOUNTER — Other Ambulatory Visit: Payer: Self-pay

## 2021-04-26 DIAGNOSIS — M654 Radial styloid tenosynovitis [de Quervain]: Secondary | ICD-10-CM | POA: Diagnosis not present

## 2021-04-26 DIAGNOSIS — M67432 Ganglion, left wrist: Secondary | ICD-10-CM | POA: Insufficient documentation

## 2021-04-26 DIAGNOSIS — D649 Anemia, unspecified: Secondary | ICD-10-CM | POA: Diagnosis not present

## 2021-04-26 DIAGNOSIS — F909 Attention-deficit hyperactivity disorder, unspecified type: Secondary | ICD-10-CM | POA: Diagnosis not present

## 2021-04-26 HISTORY — DX: Ganglion, left wrist: M67.432

## 2021-04-26 HISTORY — DX: Other allergic rhinitis: J30.89

## 2021-04-26 HISTORY — PX: MINOR RELEASE DORSAL COMPARTMENT (DEQUERVAINS): SHX5980

## 2021-04-26 HISTORY — DX: Radial styloid tenosynovitis (de quervain): M65.4

## 2021-04-26 HISTORY — DX: Attention-deficit hyperactivity disorder, unspecified type: F90.9

## 2021-04-26 HISTORY — DX: Presence of spectacles and contact lenses: Z97.3

## 2021-04-26 HISTORY — DX: Generalized anxiety disorder: F41.1

## 2021-04-26 HISTORY — DX: Primary hyperparathyroidism: E21.0

## 2021-04-26 LAB — POCT PREGNANCY, URINE: Preg Test, Ur: NEGATIVE

## 2021-04-26 SURGERY — MINOR RELEASE DORSAL COMPARTMENT (DEQUERVAINS)
Anesthesia: General | Site: Arm Lower | Laterality: Left

## 2021-04-26 MED ORDER — FENTANYL CITRATE (PF) 100 MCG/2ML IJ SOLN
INTRAMUSCULAR | Status: AC
  Filled 2021-04-26: qty 2

## 2021-04-26 MED ORDER — DEXAMETHASONE SODIUM PHOSPHATE 10 MG/ML IJ SOLN: 8.0000 mg | Freq: Once | INTRAMUSCULAR | Status: DC

## 2021-04-26 MED ORDER — PROPOFOL 10 MG/ML IV BOLUS
INTRAVENOUS | Status: DC | PRN
  Administered 2021-04-26: 200 mg via INTRAVENOUS

## 2021-04-26 MED ORDER — MIDAZOLAM HCL 2 MG/2ML IJ SOLN
INTRAMUSCULAR | Status: AC
  Filled 2021-04-26: qty 2

## 2021-04-26 MED ORDER — DEXAMETHASONE SODIUM PHOSPHATE 10 MG/ML IJ SOLN
INTRAMUSCULAR | Status: AC
  Filled 2021-04-26: qty 1

## 2021-04-26 MED ORDER — OXYCODONE HCL 5 MG PO TABS
5.0000 mg | ORAL_TABLET | Freq: Once | ORAL | Status: AC
  Administered 2021-04-26: 5 mg via ORAL

## 2021-04-26 MED ORDER — MIDAZOLAM HCL 5 MG/5ML IJ SOLN
INTRAMUSCULAR | Status: DC | PRN
  Administered 2021-04-26: 2 mg via INTRAVENOUS

## 2021-04-26 MED ORDER — ACETAMINOPHEN 500 MG PO TABS
1000.0000 mg | ORAL_TABLET | Freq: Once | ORAL | Status: AC
  Administered 2021-04-26: 1000 mg via ORAL

## 2021-04-26 MED ORDER — LACTATED RINGERS IV SOLN: INTRAVENOUS | Status: DC

## 2021-04-26 MED ORDER — CEFAZOLIN SODIUM-DEXTROSE 2-4 GM/100ML-% IV SOLN
2.0000 g | INTRAVENOUS | Status: AC
  Administered 2021-04-26: 2 g via INTRAVENOUS

## 2021-04-26 MED ORDER — DEXAMETHASONE SODIUM PHOSPHATE 4 MG/ML IJ SOLN
INTRAMUSCULAR | Status: DC | PRN
Start: 2021-04-26 — End: 2021-04-26
  Administered 2021-04-26: 10 mg via INTRAVENOUS

## 2021-04-26 MED ORDER — LIDOCAINE HCL (CARDIAC) PF 100 MG/5ML IV SOSY
PREFILLED_SYRINGE | INTRAVENOUS | Status: DC | PRN
  Administered 2021-04-26: 60 mg via INTRAVENOUS

## 2021-04-26 MED ORDER — AMISULPRIDE (ANTIEMETIC) 5 MG/2ML IV SOLN: 10.0000 mg | Freq: Once | INTRAVENOUS | Status: DC | PRN

## 2021-04-26 MED ORDER — OXYCODONE HCL 5 MG PO TABS
ORAL_TABLET | ORAL | Status: AC
  Filled 2021-04-26: qty 1

## 2021-04-26 MED ORDER — BUPIVACAINE HCL (PF) 0.25 % IJ SOLN
INTRAMUSCULAR | Status: DC | PRN
  Administered 2021-04-26: 10 mL

## 2021-04-26 MED ORDER — MELOXICAM 15 MG PO TABS
15.0000 mg | ORAL_TABLET | Freq: Every day | ORAL | 0 refills | Status: DC
  Filled 2021-04-26: qty 30, 30d supply, fill #0

## 2021-04-26 MED ORDER — POVIDONE-IODINE 10 % EX SWAB: 2.0000 "application " | Freq: Once | CUTANEOUS | Status: DC

## 2021-04-26 MED ORDER — KETOROLAC TROMETHAMINE 30 MG/ML IJ SOLN: 30.0000 mg | Freq: Once | INTRAMUSCULAR | Status: DC | PRN

## 2021-04-26 MED ORDER — KETOROLAC TROMETHAMINE 30 MG/ML IJ SOLN
INTRAMUSCULAR | Status: DC | PRN
  Administered 2021-04-26: 30 mg via INTRAVENOUS

## 2021-04-26 MED ORDER — ACETAMINOPHEN 500 MG PO TABS
ORAL_TABLET | ORAL | Status: AC
  Filled 2021-04-26: qty 2

## 2021-04-26 MED ORDER — ONDANSETRON HCL 4 MG/2ML IJ SOLN
INTRAMUSCULAR | Status: AC
  Filled 2021-04-26: qty 2

## 2021-04-26 MED ORDER — PROMETHAZINE HCL 25 MG/ML IJ SOLN
6.2500 mg | INTRAMUSCULAR | Status: DC | PRN
Start: 2021-04-26 — End: 2021-04-26

## 2021-04-26 MED ORDER — HYDROCODONE-ACETAMINOPHEN 10-325 MG PO TABS
1.0000 | ORAL_TABLET | Freq: Four times a day (QID) | ORAL | 0 refills | Status: DC | PRN
  Filled 2021-04-26: qty 28, 7d supply, fill #0

## 2021-04-26 MED ORDER — ONDANSETRON HCL 4 MG/2ML IJ SOLN
INTRAMUSCULAR | Status: DC | PRN
  Administered 2021-04-26: 4 mg via INTRAVENOUS

## 2021-04-26 MED ORDER — PROPOFOL 10 MG/ML IV BOLUS
INTRAVENOUS | Status: AC
  Filled 2021-04-26: qty 40

## 2021-04-26 MED ORDER — CEFAZOLIN SODIUM-DEXTROSE 2-4 GM/100ML-% IV SOLN
INTRAVENOUS | Status: AC
  Filled 2021-04-26: qty 100

## 2021-04-26 MED ORDER — LIDOCAINE 2% (20 MG/ML) 5 ML SYRINGE
INTRAMUSCULAR | Status: AC
  Filled 2021-04-26: qty 5

## 2021-04-26 MED ORDER — KETOROLAC TROMETHAMINE 30 MG/ML IJ SOLN
INTRAMUSCULAR | Status: AC
  Filled 2021-04-26: qty 1

## 2021-04-26 MED ORDER — 0.9 % SODIUM CHLORIDE (POUR BTL) OPTIME
TOPICAL | Status: DC | PRN
  Administered 2021-04-26: 500 mL

## 2021-04-26 MED ORDER — FENTANYL CITRATE (PF) 100 MCG/2ML IJ SOLN
INTRAMUSCULAR | Status: DC | PRN
  Administered 2021-04-26 (×2): 50 ug via INTRAVENOUS

## 2021-04-26 MED ORDER — FENTANYL CITRATE (PF) 100 MCG/2ML IJ SOLN
25.0000 ug | INTRAMUSCULAR | Status: DC | PRN
  Administered 2021-04-26: 25 ug via INTRAVENOUS

## 2021-04-26 MED ORDER — ONDANSETRON 4 MG PO TBDP
4.0000 mg | ORAL_TABLET | Freq: Two times a day (BID) | ORAL | 0 refills | Status: DC | PRN
  Filled 2021-04-26: qty 6, 3d supply, fill #0

## 2021-04-26 SURGICAL SUPPLY — 45 items
APL PRP STRL LF DISP 70% ISPRP (MISCELLANEOUS) ×1
BANDAGE ACE 3X5.8 VEL STRL LF (GAUZE/BANDAGES/DRESSINGS) ×3 IMPLANT
BLADE SURG 11 STRL SS (BLADE) IMPLANT
BLADE SURG 15 STRL LF DISP TIS (BLADE) ×1 IMPLANT
BLADE SURG 15 STRL SS (BLADE) ×3
BNDG CMPR 9X4 STRL LF SNTH (GAUZE/BANDAGES/DRESSINGS)
BNDG COHESIVE 3X5 TAN STRL LF (GAUZE/BANDAGES/DRESSINGS) IMPLANT
BNDG ELASTIC 3X5.8 VLCR STR LF (GAUZE/BANDAGES/DRESSINGS) ×2 IMPLANT
BNDG ESMARK 4X9 LF (GAUZE/BANDAGES/DRESSINGS) IMPLANT
BNDG GAUZE ELAST 4 BULKY (GAUZE/BANDAGES/DRESSINGS) ×3 IMPLANT
CHLORAPREP W/TINT 26 (MISCELLANEOUS) ×3 IMPLANT
CLOSURE WOUND 1/2 X4 (GAUZE/BANDAGES/DRESSINGS) ×1
CORD BIPOLAR FORCEPS 12FT (ELECTRODE) ×2 IMPLANT
COVER BACK TABLE 60X90IN (DRAPES) ×3 IMPLANT
CUFF TOURN SGL QUICK 18X4 (TOURNIQUET CUFF) ×3 IMPLANT
DRAPE EXTREMITY T 121X128X90 (DISPOSABLE) ×3 IMPLANT
DRAPE IMP U-DRAPE 54X76 (DRAPES) ×3 IMPLANT
DRAPE SURG 17X23 STRL (DRAPES) ×3 IMPLANT
DRSG EMULSION OIL 3X3 NADH (GAUZE/BANDAGES/DRESSINGS) ×3 IMPLANT
DRSG PAD ABDOMINAL 8X10 ST (GAUZE/BANDAGES/DRESSINGS) ×2 IMPLANT
GAUZE 4X4 16PLY ~~LOC~~+RFID DBL (SPONGE) ×3 IMPLANT
GAUZE SPONGE 4X4 12PLY STRL (GAUZE/BANDAGES/DRESSINGS) ×3 IMPLANT
GAUZE SPONGE 4X4 12PLY STRL LF (GAUZE/BANDAGES/DRESSINGS) ×2 IMPLANT
GLOVE SRG 8 PF TXTR STRL LF DI (GLOVE) ×2 IMPLANT
GLOVE SURG ENC MOIS LTX SZ7.5 (GLOVE) ×6 IMPLANT
GLOVE SURG UNDER POLY LF SZ8 (GLOVE) ×6
GOWN STRL REUS W/TWL LRG LVL3 (GOWN DISPOSABLE) ×9 IMPLANT
KIT TURNOVER CYSTO (KITS) ×3 IMPLANT
NDL HYPO 25X1 1.5 SAFETY (NEEDLE) IMPLANT
NEEDLE HYPO 25X1 1.5 SAFETY (NEEDLE) IMPLANT
NS IRRIG 1000ML POUR BTL (IV SOLUTION) ×3 IMPLANT
PACK BASIN DAY SURGERY FS (CUSTOM PROCEDURE TRAY) ×3 IMPLANT
PAD CAST 3X4 CTTN HI CHSV (CAST SUPPLIES) IMPLANT
PADDING CAST ABS 3INX4YD NS (CAST SUPPLIES)
PADDING CAST ABS 4INX4YD NS (CAST SUPPLIES) ×2
PADDING CAST ABS COTTON 3X4 (CAST SUPPLIES) IMPLANT
PADDING CAST ABS COTTON 4X4 ST (CAST SUPPLIES) ×1 IMPLANT
PADDING CAST COTTON 3X4 STRL (CAST SUPPLIES) ×3
STRIP CLOSURE SKIN 1/2X4 (GAUZE/BANDAGES/DRESSINGS) ×3 IMPLANT
SUT ETHILON 3 0 PS 1 (SUTURE) IMPLANT
SUT MNCRL AB 3-0 PS2 18 (SUTURE) IMPLANT
SUT MNCRL AB 4-0 PS2 18 (SUTURE) ×2 IMPLANT
SYR BULB EAR ULCER 3OZ GRN STR (SYRINGE) ×3 IMPLANT
SYR CONTROL 10ML LL (SYRINGE) IMPLANT
UNDERPAD 30X36 HEAVY ABSORB (UNDERPADS AND DIAPERS) ×3 IMPLANT

## 2021-04-26 NOTE — Discharge Instructions (Addendum)
POST-OPERATIVE OPIOID TAPER INSTRUCTIONS: It is important to wean off of your opioid medication as soon as possible. If you do not need pain medication after your surgery it is ok to stop day one. Opioids include: Codeine, Hydrocodone(Norco, Vicodin), Oxycodone(Percocet, oxycontin) and hydromorphone amongst others.  Long term and even short term use of opiods can cause: Increased pain response Dependence Constipation Depression Respiratory depression And more.  Withdrawal symptoms can include Flu like symptoms Nausea, vomiting And more Techniques to manage these symptoms Hydrate well Eat regular healthy meals Stay active Use relaxation techniques(deep breathing, meditating, yoga) Do Not substitute Alcohol to help with tapering If you have been on opioids for less than two weeks and do not have pain than it is ok to stop all together.  Plan to wean off of opioids This plan should start within one week post op of your joint replacement. Maintain the same interval or time between taking each dose and first decrease the dose.  Cut the total daily intake of opioids by one tablet each day Next start to increase the time between doses. The last dose that should be eliminated is the evening dose.    Do not take any Tylenol until after 1:00 pm today. Do not take any nonsteroidal anti inflammatories until after 3:00 pm today.   Post Anesthesia Home Care Instructions  Activity: Get plenty of rest for the remainder of the day. A responsible individual must stay with you for 24 hours following the procedure.  For the next 24 hours, DO NOT: -Drive a car -Paediatric nurse -Drink alcoholic beverages -Take any medication unless instructed by your physician -Make any legal decisions or sign important papers.  Meals: Start with liquid foods such as gelatin or soup. Progress to regular foods as tolerated. Avoid greasy, spicy, heavy foods. If nausea and/or vomiting occur, drink only clear  liquids until the nausea and/or vomiting subsides. Call your physician if vomiting continues.  Special Instructions/Symptoms: Your throat may feel dry or sore from the anesthesia or the breathing tube placed in your throat during surgery. If this causes discomfort, gargle with warm salt water. The discomfort should disappear within 24 hours.

## 2021-04-26 NOTE — Op Note (Signed)
04/26/2021  9:02 AM  PATIENT:  Misty [name]    PRE-OPERATIVE DIAGNOSIS:  LT WRIST GANGLION DEQUERVAINS  POST-OPERATIVE DIAGNOSIS:  Same  PROCEDURE:  MINOR RELEASE DORSAL COMPARTMENT (DEQUERVAINS) WITH GANGLION EXCISION  SURGEON:  Renette Butters, MD  ASSISTANT: Aggie Moats, PA-C, he was present and scrubbed throughout the case, critical for completion in a timely fashion, and for retraction, instrumentation, and closure.  ANESTHESIA:   I performed local anesthetic with 7cc of lidocaine/marcaine mixture.   PREOPERATIVE INDICATIONS:  Misty [name] is a  45 y.o. female with a diagnosis of LT WRIST GANGLION DEQUERVAINS who failed conservative measures and elected for surgical management.    The risks benefits and alternatives were discussed with the patient preoperatively including but not limited to the risks of infection, bleeding, nerve injury, cardiopulmonary complications, the need for revision surgery, among others, and the patient was willing to proceed.  OPERATIVE IMPLANTS: none  OPERATIVE FINDINGS: Compressive dorsal compartment with tenosynovitis of the tendons  BLOOD LOSS: min  COMPLICATIONS: none  TOURNIQUET TIME: 53min  OPERATIVE PROCEDURE:  Patient was identified in the preoperative holding area and site was marked by me She was transported to the operating theater and placed on the table in supine position taking care to pad all bony prominences. After a preincinduction time out anesthesia was induced. The left upper extremity was prepped and draped in normal sterile fashion and a pre-incision timeout was performed. She received ancef for preoperative antibiotics.   I made a transverse incision and dissected to the 1st dorsal compartments.   I identified sensory radial nerve branches and protected and retracted these.  I debrided around the ganglion cyst arising from the tendon sheath. I isolated this and removed it whole. Its contents were consisten with a  ganglion.  I then made a dorsal incision over the 1st dorsal compartment and released this.   I identified both Tendons of the compartment and confirmed no subsheath. These were then mobilized and translated well.   Tenolysis was performed of the tenosynovitis.   I then irrigated and closed the skin incision.   Sterile dressing was placed.   POST OPERATIVE PLAN: dressings for 5days. Mobilized for dvt px.

## 2021-04-26 NOTE — Anesthesia Postprocedure Evaluation (Signed)
Anesthesia Post Note  Patient: Misty [name]  Procedure(s) Performed: MINOR RELEASE DORSAL COMPARTMENT (DEQUERVAINS) WITH GANGLION EXCISION (Left: Arm Lower)     Patient location during evaluation: PACU Anesthesia Type: General Level of consciousness: awake Pain management: pain level controlled Vital Signs Assessment: post-procedure vital signs reviewed and stable Respiratory status: spontaneous breathing, nonlabored ventilation, respiratory function stable and patient connected to nasal cannula oxygen Cardiovascular status: blood pressure returned to baseline and stable Postop Assessment: no apparent nausea or vomiting Anesthetic complications: no   No notable events documented.  Last Vitals:  Vitals:   04/26/21 1000 04/26/21 1030  BP:  138/83  Pulse: 70 61  Resp: 14 16  Temp:  37.1 C  SpO2: 97% 98%    Last Pain:  Vitals:   04/26/21 1020  TempSrc:   PainSc: 5                  [name] P Harlea Goetzinger

## 2021-04-26 NOTE — Interval H&P Note (Signed)
History and Physical Interval Note:  04/26/2021 8:25 AM  [name]  has presented today for surgery, with the diagnosis of LT WRIST GANGLION DEQUERVAINS.  The various methods of treatment have been discussed with the patient and family. After consideration of risks, benefits and other options for treatment, the patient has consented to  Procedure(s): MINOR RELEASE DORSAL COMPARTMENT (DEQUERVAINS) WITH GANGLION EXCISION (Left) as a surgical intervention.  The patient's history has been reviewed, patient examined, no change in status, stable for surgery.  I have reviewed the patient's chart and labs.  Questions were answered to the patient's satisfaction.     Misty [name]

## 2021-04-26 NOTE — Transfer of Care (Signed)
Immediate Anesthesia Transfer of Care Note  Patient: Misty [name]  Procedure(s) Performed: Procedure(s) (LRB): MINOR RELEASE DORSAL COMPARTMENT (DEQUERVAINS) WITH GANGLION EXCISION (Left)  Patient Location: PACU  Anesthesia Type: General  Level of Consciousness: awake, sedated, patient cooperative and responds to stimulation  Airway & Oxygen Therapy: Patient Spontanous Breathing and Patient connected to [place] 02 and soft FM   Post-op Assessment: Report given to PACU RN, Post -op Vital signs reviewed and stable and Patient moving all extremities  Post vital signs: Reviewed and stable  Complications: No apparent anesthesia complications

## 2021-04-26 NOTE — Anesthesia Procedure Notes (Signed)
Procedure Name: LMA Insertion Date/Time: 04/26/2021 8:39 AM Performed by: Justice Rocher, CRNA Pre-anesthesia Checklist: Patient identified, Emergency Drugs available, Suction available, Patient being monitored and Timeout performed Patient Re-evaluated:Patient Re-evaluated prior to induction Oxygen Delivery Method: Circle system utilized Preoxygenation: Pre-oxygenation with 100% oxygen Induction Type: IV induction Ventilation: Mask ventilation without difficulty LMA: LMA inserted LMA Size: 4.0 Number of attempts: 1 Airway Equipment and Method: Bite block Placement Confirmation: positive ETCO2, breath sounds checked- equal and bilateral and CO2 detector Tube secured with: Tape Dental Injury: Teeth and Oropharynx as per pre-operative assessment

## 2021-04-27 ENCOUNTER — Encounter (HOSPITAL_BASED_OUTPATIENT_CLINIC_OR_DEPARTMENT_OTHER): Payer: Self-pay | Admitting: Orthopedic Surgery

## 2021-04-28 ENCOUNTER — Other Ambulatory Visit: Payer: Self-pay

## 2021-04-28 ENCOUNTER — Ambulatory Visit
Admission: RE | Admit: 2021-04-28 | Discharge: 2021-04-28 | Disposition: A | Discharge status: Home / Self Care | Payer: 59 | Source: Ambulatory Visit | Attending: Obstetrics and Gynecology | Admitting: Obstetrics and Gynecology

## 2021-04-28 DIAGNOSIS — N179 Acute kidney failure, unspecified: Secondary | ICD-10-CM

## 2021-04-28 DIAGNOSIS — C50919 Malignant neoplasm of unspecified site of unspecified female breast: Secondary | ICD-10-CM | POA: Insufficient documentation

## 2021-04-28 DIAGNOSIS — N6310 Unspecified lump in the right breast, unspecified quadrant: Secondary | ICD-10-CM

## 2021-04-28 DIAGNOSIS — D0511 Intraductal carcinoma in situ of right breast: Secondary | ICD-10-CM | POA: Diagnosis not present

## 2021-04-28 DIAGNOSIS — N6314 Unspecified lump in the right breast, lower inner quadrant: Secondary | ICD-10-CM | POA: Diagnosis not present

## 2021-04-28 DIAGNOSIS — G43909 Migraine, unspecified, not intractable, without status migrainosus: Secondary | ICD-10-CM

## 2021-04-28 DIAGNOSIS — F419 Anxiety disorder, unspecified: Secondary | ICD-10-CM

## 2021-04-28 HISTORY — DX: Malignant neoplasm of unspecified site of unspecified female breast: C50.919

## 2021-04-29 ENCOUNTER — Encounter (HOSPITAL_BASED_OUTPATIENT_CLINIC_OR_DEPARTMENT_OTHER): Payer: Self-pay

## 2021-04-29 ENCOUNTER — Emergency Department (HOSPITAL_BASED_OUTPATIENT_CLINIC_OR_DEPARTMENT_OTHER)
Admission: EM | Admit: 2021-04-29 | Discharge: 2021-04-29 | Disposition: A | Discharge status: Home / Self Care | Payer: 59 | Attending: Emergency Medicine | Admitting: Emergency Medicine

## 2021-04-29 ENCOUNTER — Other Ambulatory Visit: Payer: Self-pay

## 2021-04-29 ENCOUNTER — Emergency Department (HOSPITAL_BASED_OUTPATIENT_CLINIC_OR_DEPARTMENT_OTHER): Payer: 59

## 2021-04-29 DIAGNOSIS — Z91040 Latex allergy status: Secondary | ICD-10-CM | POA: Diagnosis not present

## 2021-04-29 DIAGNOSIS — N210 Calculus in bladder: Secondary | ICD-10-CM | POA: Diagnosis not present

## 2021-04-29 DIAGNOSIS — N202 Calculus of kidney with calculus of ureter: Secondary | ICD-10-CM | POA: Insufficient documentation

## 2021-04-29 DIAGNOSIS — R1031 Right lower quadrant pain: Secondary | ICD-10-CM | POA: Diagnosis present

## 2021-04-29 DIAGNOSIS — N200 Calculus of kidney: Secondary | ICD-10-CM

## 2021-04-29 DIAGNOSIS — N281 Cyst of kidney, acquired: Secondary | ICD-10-CM | POA: Insufficient documentation

## 2021-04-29 DIAGNOSIS — Z8616 Personal history of COVID-19: Secondary | ICD-10-CM | POA: Insufficient documentation

## 2021-04-29 DIAGNOSIS — N1330 Unspecified hydronephrosis: Secondary | ICD-10-CM | POA: Diagnosis not present

## 2021-04-29 DIAGNOSIS — D259 Leiomyoma of uterus, unspecified: Secondary | ICD-10-CM | POA: Diagnosis not present

## 2021-04-29 LAB — URINALYSIS, MICROSCOPIC (REFLEX)

## 2021-04-29 LAB — COMPREHENSIVE METABOLIC PANEL
ALT: 25 U/L (ref 0–44)
AST: 21 U/L (ref 15–41)
Albumin: 4 g/dL (ref 3.5–5.0)
Alkaline Phosphatase: 59 U/L (ref 38–126)
Anion gap: 9 (ref 5–15)
BUN: 12 mg/dL (ref 6–20)
CO2: 25 mmol/L (ref 22–32)
Calcium: 10.4 mg/dL — ABNORMAL HIGH (ref 8.9–10.3)
Chloride: 105 mmol/L (ref 98–111)
Creatinine, Ser: 0.83 mg/dL (ref 0.44–1.00)
GFR, Estimated: >60 mL/min (ref >60)
Glucose, Bld: 110 mg/dL — ABNORMAL HIGH (ref 70–99)
Potassium: 3.5 mmol/L (ref 3.5–5.1)
Sodium: 139 mmol/L (ref 135–145)
Total Bilirubin: 0.5 mg/dL (ref 0.3–1.2)
Total Protein: 7.1 g/dL (ref 6.5–8.1)

## 2021-04-29 LAB — CBC WITH DIFFERENTIAL/PLATELET
Abs Immature Granulocytes: 0.01 10*3/uL (ref 0.00–0.07)
Basophils Absolute: 0 10*3/uL (ref 0.0–0.1)
Basophils Relative: 1 %
Eosinophils Absolute: 0 10*3/uL (ref 0.0–0.5)
Eosinophils Relative: 1 %
HCT: 46.1 % — ABNORMAL HIGH (ref 36.0–46.0)
Hemoglobin: 14.4 g/dL (ref 12.0–15.0)
Immature Granulocytes: 0 %
Lymphocytes Relative: 32 %
Lymphs Abs: 2 10*3/uL (ref 0.7–4.0)
MCH: 25 pg — ABNORMAL LOW (ref 26.0–34.0)
MCHC: 31.2 g/dL (ref 30.0–36.0)
MCV: 80 fL (ref 80.0–100.0)
Monocytes Absolute: 0.5 10*3/uL (ref 0.1–1.0)
Monocytes Relative: 8 %
Neutro Abs: 3.7 10*3/uL (ref 1.7–7.7)
Neutrophils Relative %: 58 %
Platelets: 253 10*3/uL (ref 150–400)
RBC: 5.76 MIL/uL — ABNORMAL HIGH (ref 3.87–5.11)
RDW: 22.9 % — ABNORMAL HIGH (ref 11.5–15.5)
Smear Review: NORMAL
WBC Morphology: >10
WBC: 6.3 10*3/uL (ref 4.0–10.5)
nRBC: 0 % (ref 0.0–0.2)

## 2021-04-29 LAB — PREGNANCY, URINE: Preg Test, Ur: NEGATIVE

## 2021-04-29 LAB — URINALYSIS, ROUTINE W REFLEX MICROSCOPIC
Bilirubin Urine: NEGATIVE
Glucose, UA: NEGATIVE mg/dL
Ketones, ur: NEGATIVE mg/dL
Leukocytes,Ua: NEGATIVE
Nitrite: NEGATIVE
Protein, ur: NEGATIVE mg/dL
Specific Gravity, Urine: 1.02 (ref 1.005–1.030)
pH: 7.5 (ref 5.0–8.0)

## 2021-04-29 LAB — LIPASE, BLOOD: Lipase: 25 U/L (ref 11–51)

## 2021-04-29 MED ORDER — ONDANSETRON HCL 4 MG/2ML IJ SOLN
4.0000 mg | Freq: Once | INTRAMUSCULAR | Status: AC
  Administered 2021-04-29: 4 mg via INTRAVENOUS
  Filled 2021-04-29: qty 2

## 2021-04-29 MED ORDER — FENTANYL CITRATE PF 50 MCG/ML IJ SOSY
50.0000 ug | PREFILLED_SYRINGE | Freq: Once | INTRAMUSCULAR | Status: AC
  Administered 2021-04-29: 50 ug via INTRAVENOUS
  Filled 2021-04-29: qty 1

## 2021-04-29 MED ORDER — KETOROLAC TROMETHAMINE 15 MG/ML IJ SOLN
15.0000 mg | Freq: Once | INTRAMUSCULAR | Status: AC
  Administered 2021-04-29: 15 mg via INTRAVENOUS
  Filled 2021-04-29: qty 1

## 2021-04-29 NOTE — Discharge Instructions (Signed)
Please follow-up with Dr. Diona Fanti.  Discussed the possible kidney cyst and see if he recommends getting an ultrasound.  If you develop fever, uncontrolled pain or vomiting, come back to ER for reassessment.  He have any ongoing pain take Tylenol or Motrin as needed.

## 2021-04-29 NOTE — ED Notes (Signed)
Pt transported to CT ?

## 2021-04-29 NOTE — ED Notes (Signed)
Pt reports she feels like she may have passed a kidney stone while in the bathroom.

## 2021-04-29 NOTE — ED Triage Notes (Signed)
Patient reports lower abdominal pain "it feels like a kidney stone" patient has a history of kidney stones.  Patient reports left wrist surgery on Tuesday followed by mild abd pain and diarrhea, and biopsy on right breast yesterday, now having constipation and lower abdominal pain.

## 2021-04-29 NOTE — ED Notes (Signed)
D/c paperwork reviewed with pt, incl f/u care. Pt verbalized understanding, ambulatory to ED exit.  NAD.

## 2021-04-30 NOTE — ED Provider Notes (Signed)
[facility] EMERGENCY DEPARTMENT Provider Note   CSN: [id number] Arrival date & time: 04/29/21  6578     History Chief Complaint  Patient presents with   Abdominal Pain    Misty [name] is a 45 y.o. female.  Presents to ER with concern for abdominal pain.  Pain is worse on the right side, initially mild but now quite severe, up to 10 out of 10 in severity.  Right flank, right lower abdomen.  Some associated nausea.  No fevers or chills.  Feels like prior kidney stone. Reports seeing Dr. Kem Boroughs.   HPI     Past Medical History:  Diagnosis Date   ADHD (attention deficit hyperactivity disorder)    Anemia    Environmental and seasonal allergies    Frequency of urination    GAD (generalized anxiety disorder)    Ganglion cyst of wrist, left    GERD (gastroesophageal reflux disease)    History of COVID-19 06/12/2020   result in epic;  per pt mild symptoms that resolved   History of kidney stones    Hyperparathyroidism, primary ([place])    currently followed by pcp  (previously seen endocrinologist-- dr Louie Boston note in epic 08-29-2017, doctor moved away)   Tenosynovitis, de Quervain    left wrist   Wears glasses     Patient Active Problem List   Diagnosis Date Noted   BMI 30.0-30.9,adult    UTI 04/10/2007   ALLERGIC RHINITIS 10/30/2006   NEPHROLITHIASIS, HX OF 10/30/2006    Past Surgical History:  Procedure Laterality Date   CYSTO W/ RIGHT URETEROSCOPY  2006   CYSTO/ RIGHT URETERAL STENT PLACEMENT  07-29-2009   LARGE RIGHT RENAL CALCULUS   CYSTOLITHOLAPAXY OF LARGE BLADDER CALCULI/   RIGHT URETERAL STENT EXCHANGE  04-15-2010   CYSTOSCOPY WITH STENT PLACEMENT  07/04/2012   Procedure: CYSTOSCOPY WITH STENT PLACEMENT;  Surgeon: Franchot Gallo, MD;  Location: [facility];  Service: Urology;  Laterality: Right;  rt dbl j stent placement    CYSTOSCOPY/RETROGRADE/URETEROSCOPY/STONE EXTRACTION WITH BASKET  07/25/2012   Procedure:  CYSTOSCOPY/RETROGRADE/URETEROSCOPY/STONE EXTRACTION WITH BASKET;  Surgeon: Franchot Gallo, MD;  Location: [facility];  Service: Urology;  Laterality: Right;   EXTRACORPOREAL SHOCK WAVE LITHOTRIPSY  08-02-2009   RIGHT   HOLMIUM LASER APPLICATION  09/23/9627   Procedure: HOLMIUM LASER APPLICATION;  Surgeon: Franchot Gallo, MD;  Location: [facility];  Service: Urology;  Laterality: Right;   LAPAROSCOPIC TUBAL LIGATION Bilateral 01/17/2013   Procedure: LAPAROSCOPIC TUBAL LIGATION;  Surgeon: Melina Schools, MD;  Location: [facility];  Service: Gynecology;  Laterality: Bilateral;  1hr OR time    MINOR RELEASE DORSAL COMPARTMENT (DEQUERVAINS) Left 04/26/2021   Procedure: MINOR RELEASE DORSAL COMPARTMENT (DEQUERVAINS) WITH GANGLION EXCISION;  Surgeon: Renette Butters, MD;  Location: [place];  Service: Orthopedics;  Laterality: Left;     OB History     Gravida  7   Para      Term      Preterm      AB  2   Living  5      SAB  1   IAB  1   Ectopic      Multiple      Live Births  5           Family History  Problem Relation Age of Onset   Breast cancer Maternal Aunt    Breast cancer Maternal Grandmother    Esophageal cancer  Maternal Uncle    Stomach cancer Neg Hx    Colon cancer Neg Hx    Pancreatic cancer Neg Hx     Social History   Tobacco Use   Smoking status: Never   Smokeless tobacco: Never  Vaping Use   Vaping Use: Never used  Substance Use Topics   Alcohol use: Yes    Comment: occasional   Drug use: Never    Home Medications Prior to Admission medications   Medication Sig Start Date End Date Taking? Authorizing Provider  ALPRAZolam Duanne Moron) 1 MG tablet Take 1 tablet (1 mg total) by mouth 4 (four) times daily as needed (fill 03/22/21) 03/22/21     amphetamine-dextroamphetamine (ADDERALL) 20 MG tablet Take 1 tablet (20 mg total) by mouth 3 (three) times daily (fill 03/25/21) Patient taking differently: Take  20 mg by mouth 3 (three) times daily. 03/25/21     Ascorbic Acid (VITAMIN C PO) Take by mouth daily.    [provider]  baclofen (LIORESAL) 10 MG tablet Take 1 tablet (10 mg total) by mouth 2 (two) times daily as needed Limit 1-2 treatments per week Patient taking differently: Take 10 mg by mouth 2 (two) times daily as needed. 11/19/20     Cholecalciferol (VITAMIN D3 PO) Take by mouth daily.    [provider]  HYDROcodone-acetaminophen (NORCO) 10-325 MG tablet Take 1 tablet by mouth every 6 (six) hours as needed for severe pain. 04/26/21   Britt Bottom, PA-C  meloxicam (MOBIC) 15 MG tablet Take 1 tablet (15 mg total) by mouth once a day for pain and inflammation 04/26/21   Aggie Moats M, PA-C  montelukast (SINGULAIR) 10 MG tablet Take 1 tablet by mouth every evening Patient taking differently: Take 10 mg by mouth at bedtime. 04/18/21     omeprazole (PRILOSEC) 40 MG capsule TAKE 1 CAPSULE BY MOUTH ONCE DAILY BEFORE BREAKFAST Patient taking differently: Take 40 mg by mouth daily. 07/06/20 07/06/21  Milus Banister, MD  ondansetron (ZOFRAN ODT) 4 MG disintegrating tablet Dissolve1 tablet (4 mg total) by mouth 2 (two) times daily as needed for nausea or vomiting. 04/26/21   Britt Bottom, PA-C  triamcinolone ointment (KENALOG) 0.1 % APPLY A THIN LAYER TO THE AFFECTED AREA(S) OF SKIN 2 TIMES PER DAY 04/04/21     zonisamide (ZONEGRAN) 25 MG capsule Take 4 capsules (100 mg total) by mouth every evening. Patient taking differently: Take 100 mg by mouth as needed. 11/19/20       Allergies    Dilaudid [hydromorphone hcl], Ceftriaxone sodium, and Latex  Review of Systems   Review of Systems  Constitutional:  Negative for chills and fever.  HENT:  Negative for ear pain and sore throat.   Eyes:  Negative for pain and visual disturbance.  Respiratory:  Negative for cough and shortness of breath.   Cardiovascular:  Negative for chest pain and palpitations.  Gastrointestinal:  Positive  for abdominal pain. Negative for vomiting.  Genitourinary:  Positive for flank pain. Negative for dysuria and hematuria.  Musculoskeletal:  Negative for arthralgias and back pain.  Skin:  Negative for color change and rash.  Neurological:  Negative for seizures and syncope.  All other systems reviewed and are negative.  Physical Exam Updated Vital Signs BP (!) 133/94   Pulse 79   Temp 98.2 F (36.8 C) (Oral)   Resp 15   SpO2 100%   Physical Exam Vitals and nursing note reviewed.  Constitutional:  General: She is not in acute distress.    Appearance: She is well-developed.     Comments: Appears uncomfortable secondary to pain but not in distress  HENT:     Head: Normocephalic and atraumatic.  Eyes:     Conjunctiva/sclera: Conjunctivae normal.  Cardiovascular:     Rate and Rhythm: Normal rate and regular rhythm.     Heart sounds: No murmur heard. Pulmonary:     Effort: Pulmonary effort is normal. No respiratory distress.     Breath sounds: Normal breath sounds.  Abdominal:     Palpations: Abdomen is soft.     Tenderness: There is abdominal tenderness in the right lower quadrant.     Comments: Some pain in the right lower quadrant and right flank but no rebound or guarding  Musculoskeletal:     Cervical back: Neck supple.  Skin:    General: Skin is warm and dry.  Neurological:     Mental Status: She is alert.    ED Results / Procedures / Treatments   Labs (all labs ordered are listed, but only abnormal results are displayed) Labs Reviewed  URINALYSIS, ROUTINE W REFLEX MICROSCOPIC - Abnormal; Notable for the following components:      Result Value   APPearance CLOUDY (*)    Hgb urine dipstick TRACE (*)    All other components within normal limits  CBC WITH DIFFERENTIAL/PLATELET - Abnormal; Notable for the following components:   RBC 5.76 (*)    HCT 46.1 (*)    MCH 25.0 (*)    RDW 22.9 (*)    All other components within normal limits  COMPREHENSIVE METABOLIC  PANEL - Abnormal; Notable for the following components:   Glucose, Bld 110 (*)    Calcium 10.4 (*)    All other components within normal limits  URINALYSIS, MICROSCOPIC (REFLEX) - Abnormal; Notable for the following components:   Bacteria, UA MANY (*)    All other components within normal limits  PREGNANCY, URINE  LIPASE, BLOOD    EKG None  Radiology CT Renal Stone Study  Result Date: 04/29/2021 CLINICAL DATA:  Flank pain, kidney stone suspected. History of kidney stones. Laterality not specified. EXAM: CT ABDOMEN AND PELVIS WITHOUT CONTRAST TECHNIQUE: Multidetector CT imaging of the abdomen and pelvis was performed following the standard protocol without IV contrast. COMPARISON:  Abdominopelvic CT 10/18/2017. FINDINGS: Lower chest: Clear lung bases. No significant pleural or pericardial effusion. There is a small hiatal hernia. There is a small amount of soft tissue emphysema medially in the right breast related to recent right breast biopsy (yesterday). Hepatobiliary: The liver appears unremarkable as imaged in the noncontrast state. No evidence of gallstones, gallbladder wall thickening or biliary dilatation. Pancreas: Unremarkable. No pancreatic ductal dilatation or surrounding inflammatory changes. Spleen: Normal in size without focal abnormality. Adrenals/Urinary Tract: The adrenal glands appear stable, without suspicious findings. There are numerous caliceal calculi on the right which have increased in size and number, largest in the lower pole measuring 10 mm on image 39/2. There is a tiny nonobstructing calculus in the upper pole of the left kidney. There is moderate right-sided hydronephrosis and hydroureter with mild right-sided perinephric soft tissue stranding secondary to an obstructing 5 mm calculus at the right ureterovesical junction. This is seen on image 71/2 and is visible on the scout image. There is an adjacent tiny calculus in the bladder. No evidence of left ureteral  calculus. A simple cyst in the interpolar region of the left kidney has mildly enlarged, measuring  3.8 cm on image 31/2. More inferiorly in the lower pole of the left kidney, there is a new mild hyperdense lesion measuring 47 HU and 1.8 cm on image 34/2. Stomach/Bowel: No enteric contrast administered. As above, there is a small hiatal hernia. The stomach appears unremarkable for its degree of distension. No evidence of bowel wall thickening, distention or surrounding inflammatory change. The appendix appears normal. Vascular/Lymphatic: There are no enlarged abdominal or pelvic lymph nodes. No significant vascular findings on noncontrast imaging. Reproductive: Enlarging uterine fibroids are noted. No suspicious adnexal findings. Other: Stable small periumbilical hernia containing only fat. No ascites. Musculoskeletal: No acute or significant osseous findings. IMPRESSION: 1. Obstructing 5 mm calculus at the right ureterovesical junction. Additional tiny calculus in the bladder. 2. Bilateral nephrolithiasis with interval progression on the right. 3. New indeterminate, mildly hyperdense lesion in the lower pole of the left kidney, incompletely characterized without contrast. This is likely a cyst. Confirmation with ultrasound at urology follow-up recommended. 4. Enlarging uterine fibroids. 5. Recent right breast biopsy. Electronically Signed   By: Richardean Sale M.D.   On: 04/29/2021 10:37   MM CLIP PLACEMENT RIGHT  Result Date: 04/28/2021 CLINICAL DATA:  Status post ultrasound-guided core biopsy of RIGHT breast mass. EXAM: 3D DIAGNOSTIC RIGHT MAMMOGRAM POST ULTRASOUND BIOPSY COMPARISON:  Previous exam(s). FINDINGS: 3D Mammographic images were obtained following ultrasound guided biopsy of RIGHT breast mass and placement of a tribell clip. Due to the posterior location of the lesion, the clip is not visualized despite additional views. IMPRESSION: Tribell clip is not visible on mammographic images secondary to  its posterior location. Final Assessment: Post Procedure Mammograms for Marker Placement Electronically Signed   By: Nolon Nations M.D.   On: 04/28/2021 14:10  [place] RT BREAST BX W LOC DEV 1ST LESION IMG BX SPEC US GUIDE  Result Date: 04/28/2021 CLINICAL DATA:  Patient presents for ultrasound-guided core biopsy of RIGHT breast lesion. EXAM: ULTRASOUND GUIDED RIGHT BREAST CORE NEEDLE BIOPSY COMPARISON:  Previous exam(s). PROCEDURE: I met with the patient and we discussed the procedure of ultrasound-guided biopsy, including benefits and alternatives. We discussed the high likelihood of a successful procedure. We discussed the risks of the procedure, including infection, bleeding, tissue injury, clip migration, and inadequate sampling. Informed written consent was given. The usual time-out protocol was performed immediately prior to the procedure. Lesion quadrant: LOWER INNER QUADRANT RIGHT breast Using sterile technique and 1% Lidocaine as local anesthetic, under direct ultrasound visualization, a 12 gauge spring-loaded device was used to perform biopsy of mass in the 4 o'clock location of the RIGHT breast using a inferior to superior approach. At the conclusion of the procedure tribell tissue marker clip was deployed into the biopsy cavity. Follow up 2 view mammogram was performed and dictated separately. IMPRESSION: Ultrasound guided biopsy of RIGHT breast mass. No apparent complications. Electronically Signed   By: Nolon Nations M.D.   On: 04/28/2021 14:11   Procedures Procedures   Medications Ordered in ED Medications  ondansetron (ZOFRAN) injection 4 mg (4 mg Intravenous Given 04/29/21 0954)  ketorolac (TORADOL) 15 MG/ML injection 15 mg (15 mg Intravenous Given 04/29/21 0954)  fentaNYL (SUBLIMAZE) injection 50 mcg (50 mcg Intravenous Given 04/29/21 1011)  fentaNYL (SUBLIMAZE) injection 50 mcg (50 mcg Intravenous Given 04/29/21 1105)    ED Course  I have reviewed the triage vital signs and  the nursing notes.  Pertinent labs & imaging results that were available during my care of the patient were reviewed by me and considered  in my medical decision making (see chart for details).    MDM Rules/Calculators/A&P                          45 year old presents to ER with concern for right-sided pain, similar to prior kidney stones.  CT demonstrating 5 mm stone at right UVJ.  UA negative for infection.  Remainder of labs stable.  Patient was provided symptomatic control, while in ER after CT was complete, she thinks that she saw stone pass and her symptoms completely resolved.  Given location of stone was at UVJ, she may have in fact passed stone already.  The radiologist commented on possible cyst in the left kidney and recommended discussing with urology and possible ultrasound.  Discussed this finding with patient and recommended that she still follow-up with urology to discuss this.  Reviewed return precautions and discharged home.  After the discussed management above, the patient was determined to be safe for discharge.  The patient was in agreement with this plan and all questions regarding their care were answered.  ED return precautions were discussed and the patient will return to the ED with any significant worsening of condition.   Final Clinical Impression(s) / ED Diagnoses Final diagnoses:  Nephrolithiasis  Cyst of left kidney    Rx / DC Orders ED Discharge Orders     None        Lucrezia Starch, MD 04/30/21 1233

## 2021-05-04 ENCOUNTER — Telehealth: Payer: Self-pay | Admitting: Hematology and Oncology

## 2021-05-04 NOTE — Progress Notes (Signed)
[place] NOTE  Patient Care Team: [name], MD as PCP - General (Family Medicine)  CHIEF COMPLAINTS/PURPOSE OF CONSULTATION:  Newly diagnosed right breast DCIS  HISTORY OF PRESENTING ILLNESS:  Misty [name] 45 y.o. female is here because of recent diagnosis of right breast DCIS. Screening mammogram on 03/21/2021 showed a possible asymmetry in the right breast. Diagnostic mammogram and [place] on 04/15/2021 showed indeterminate mass in the right breast at 4 o'clock, 10 cm from the nipple. Biopsy on 04/28/2021 showed DCIS involving papillary lesions. She presents to the clinic today for initial evaluation and discussion of treatment options.   I reviewed her records extensively and collaborated the history with the patient.  SUMMARY OF ONCOLOGIC HISTORY: Oncology History  Ductal carcinoma in situ (DCIS) of right breast  04/28/2021 Initial Diagnosis   Screening mammogram detected right breast mass at 4 o'clock position 10 cm from the nipple measuring 2.8 x 1.5 x 3.3 cm, axilla negative, biopsy revealed low to intermediate grade DCIS involving papillary lesion, ER/PR pending     MEDICAL HISTORY:  Past Medical History:  Diagnosis Date   ADHD (attention deficit hyperactivity disorder)    Anemia    Environmental and seasonal allergies    Frequency of urination    GAD (generalized anxiety disorder)    Ganglion cyst of wrist, left    GERD (gastroesophageal reflux disease)    History of COVID-19 06/12/2020   result in epic;  per pt mild symptoms that resolved   History of kidney stones    Hyperparathyroidism, primary ([place])    currently followed by pcp  (previously seen endocrinologist-- dr Louie Boston note in epic 08-29-2017, doctor moved away)   Tenosynovitis, de Quervain    left wrist   Wears glasses     SURGICAL HISTORY: Past Surgical History:  Procedure Laterality Date   CYSTO W/ RIGHT URETEROSCOPY  2006   CYSTO/ RIGHT URETERAL STENT PLACEMENT   07-29-2009   LARGE RIGHT RENAL CALCULUS   CYSTOLITHOLAPAXY OF LARGE BLADDER CALCULI/   RIGHT URETERAL STENT EXCHANGE  04-15-2010   CYSTOSCOPY WITH STENT PLACEMENT  07/04/2012   Procedure: CYSTOSCOPY WITH STENT PLACEMENT;  Surgeon: Franchot Gallo, MD;  Location: [facility];  Service: Urology;  Laterality: Right;  rt dbl j stent placement    CYSTOSCOPY/RETROGRADE/URETEROSCOPY/STONE EXTRACTION WITH BASKET  07/25/2012   Procedure: CYSTOSCOPY/RETROGRADE/URETEROSCOPY/STONE EXTRACTION WITH BASKET;  Surgeon: Franchot Gallo, MD;  Location: [facility];  Service: Urology;  Laterality: Right;   EXTRACORPOREAL SHOCK WAVE LITHOTRIPSY  08-02-2009   RIGHT   HOLMIUM LASER APPLICATION  0/02/7352   Procedure: HOLMIUM LASER APPLICATION;  Surgeon: Franchot Gallo, MD;  Location: [facility];  Service: Urology;  Laterality: Right;   LAPAROSCOPIC TUBAL LIGATION Bilateral 01/17/2013   Procedure: LAPAROSCOPIC TUBAL LIGATION;  Surgeon: Melina Schools, MD;  Location: [facility];  Service: Gynecology;  Laterality: Bilateral;  1hr OR time    MINOR RELEASE DORSAL COMPARTMENT (DEQUERVAINS) Left 04/26/2021   Procedure: MINOR RELEASE DORSAL COMPARTMENT (DEQUERVAINS) WITH GANGLION EXCISION;  Surgeon: Renette Butters, MD;  Location: [place];  Service: Orthopedics;  Laterality: Left;    SOCIAL HISTORY: Social History   Socioeconomic History   Marital status: Married    Spouse name: Not on file   Number of children: Not on file   Years of education: Not on file   Highest education level: Not on file  Occupational History   Not on file  Tobacco Use  Smoking status: Never   Smokeless tobacco: Never  Vaping Use   Vaping Use: Never used  Substance and Sexual Activity   Alcohol use: Yes    Comment: occasional   Drug use: Never   Sexual activity: Not on file  Other Topics Concern   Not on file  Social History Narrative   Not on file   Social  Determinants of Health   Financial Resource Strain: Not on file  Food Insecurity: Not on file  Transportation Needs: Not on file  Physical Activity: Not on file  Stress: Not on file  Social Connections: Not on file  Intimate Partner Violence: Not on file    FAMILY HISTORY: Family History  Problem Relation Age of Onset   Breast cancer Maternal Aunt    Breast cancer Maternal Grandmother    Esophageal cancer Maternal Uncle    Stomach cancer Neg Hx    Colon cancer Neg Hx    Pancreatic cancer Neg Hx     ALLERGIES:  is allergic to dilaudid [hydromorphone hcl], ceftriaxone sodium, and latex.  MEDICATIONS:  Current Outpatient Medications  Medication Sig Dispense Refill   ALPRAZolam (XANAX) 1 MG tablet Take 1 tablet (1 mg total) by mouth 4 (four) times daily as needed (fill 03/22/21) 360 tablet 0   amphetamine-dextroamphetamine (ADDERALL) 20 MG tablet Take 1 tablet (20 mg total) by mouth 3 (three) times daily (fill 03/25/21) (Patient taking differently: Take 20 mg by mouth 3 (three) times daily.) 270 tablet 0   Ascorbic Acid (VITAMIN C PO) Take by mouth daily.     baclofen (LIORESAL) 10 MG tablet Take 1 tablet (10 mg total) by mouth 2 (two) times daily as needed Limit 1-2 treatments per week (Patient taking differently: Take 10 mg by mouth 2 (two) times daily as needed.) 20 tablet 0   Cholecalciferol (VITAMIN D3 PO) Take by mouth daily.     HYDROcodone-acetaminophen (NORCO) 10-325 MG tablet Take 1 tablet by mouth every 6 (six) hours as needed for severe pain. 28 tablet 0   meloxicam (MOBIC) 15 MG tablet Take 1 tablet (15 mg total) by mouth once a day for pain and inflammation 30 tablet 0   montelukast (SINGULAIR) 10 MG tablet Take 1 tablet by mouth every evening (Patient taking differently: Take 10 mg by mouth at bedtime.) 90 tablet 0   omeprazole (PRILOSEC) 40 MG capsule TAKE 1 CAPSULE BY MOUTH ONCE DAILY BEFORE BREAKFAST (Patient taking differently: Take 40 mg by mouth daily.) 30 capsule 10    ondansetron (ZOFRAN ODT) 4 MG disintegrating tablet Dissolve1 tablet (4 mg total) by mouth 2 (two) times daily as needed for nausea or vomiting. 6 tablet 0   triamcinolone ointment (KENALOG) 0.1 % APPLY A THIN LAYER TO THE AFFECTED AREA(S) OF SKIN 2 TIMES PER DAY 30 g 0   zonisamide (ZONEGRAN) 25 MG capsule Take 4 capsules (100 mg total) by mouth every evening. (Patient taking differently: Take 100 mg by mouth as needed.) 120 capsule 1   No current facility-administered medications for this visit.    REVIEW OF SYSTEMS:   Constitutional: Denies fevers, chills or abnormal night sweats Eyes: Denies blurriness of vision, double vision or watery eyes Ears, nose, mouth, throat, and face: Denies mucositis or sore throat Respiratory: Denies cough, dyspnea or wheezes Cardiovascular: Denies palpitation, chest discomfort or lower extremity swelling Gastrointestinal:  Denies nausea, heartburn or change in bowel habits Skin: Denies abnormal skin rashes Lymphatics: Denies new lymphadenopathy or easy bruising Neurological:Denies numbness, tingling or new  weaknesses Behavioral/Psych: Mood is stable, no new changes  Breast:  Denies any palpable lumps or discharge All other systems were reviewed with the patient and are negative.  PHYSICAL EXAMINATION: ECOG PERFORMANCE STATUS: 1 - Symptomatic but completely ambulatory  Vitals:   05/05/21 1436  BP: (!) 134/96  Pulse: 78  Resp: 17  Temp: (!) 97.5 F (36.4 C)  SpO2: 100%   Filed Weights   05/05/21 1436  Weight: 208 lb 12.8 oz (94.7 kg)    GENERAL:alert, no distress and comfortable SKIN: skin color, texture, turgor are normal, no rashes or significant lesions EYES: normal, conjunctiva are pink and non-injected, sclera clear OROPHARYNX:no exudate, no erythema and lips, buccal mucosa, and tongue normal  NECK: supple, thyroid normal size, non-tender, without nodularity LYMPH:  no palpable lymphadenopathy in the cervical, axillary or  inguinal LUNGS: clear to auscultation and percussion with normal breathing effort HEART: regular rate & rhythm and no murmurs and no lower extremity edema ABDOMEN:abdomen soft, non-tender and normal bowel sounds Musculoskeletal:no cyanosis of digits and no clubbing  PSYCH: alert & oriented x 3 with fluent speech NEURO: no focal motor/sensory deficits    LABORATORY DATA:  I have reviewed the data as listed Lab Results  Component Value Date   WBC 6.3 04/29/2021   HGB 14.4 04/29/2021   HCT 46.1 (H) 04/29/2021   MCV 80.0 04/29/2021   PLT 253 04/29/2021   Lab Results  Component Value Date   NA 139 04/29/2021   K 3.5 04/29/2021   CL 105 04/29/2021   CO2 25 04/29/2021    RADIOGRAPHIC STUDIES: I have personally reviewed the radiological reports and agreed with the findings in the report.  ASSESSMENT AND PLAN:  Ductal carcinoma in situ (DCIS) of right breast Screening mammogram detected right breast mass at 4 o'clock position 10 cm from the nipple measuring 2.8 x 1.5 x 3.3 cm, axilla negative, biopsy revealed low to intermediate grade DCIS involving papillary lesion, ER/PR pending  Pathology review: I discussed with the patient the difference between DCIS and invasive breast cancer. It is considered a precancerous lesion. DCIS is classified as a 0. It is generally detected through mammograms as calcifications. We discussed the significance of grades and its impact on prognosis. We also discussed the importance of ER and PR receptors and their implications to adjuvant treatment options. Prognosis of DCIS dependence on grade, comedo necrosis. It is anticipated that if not treated, 20-30% of DCIS can develop into invasive breast cancer.  Recommendation: 1. Breast conserving surgery 2. Followed by adjuvant radiation therapy 3. Followed by antiestrogen therapy with tamoxifen 5 years (if she is ER/PR positive)  Tamoxifen counseling: We discussed the risks and benefits of tamoxifen. These  include but not limited to insomnia, hot flashes, mood changes, vaginal dryness, and weight gain. Although rare, serious side effects including endometrial cancer, risk of blood clots were also discussed. We strongly believe that the benefits far outweigh the risks. Patient understands these risks and consented to starting treatment. Planned treatment duration is 5 years.  Return to clinic after surgery to discuss the final pathology report and come up with an adjuvant treatment plan.   All questions were answered. The patient knows to call the clinic with any problems, questions or concerns.   Rulon Eisenmenger, MD, MPH 05/05/2021    I, Thana Ates, am acting as scribe for Nicholas Lose, MD.  I have reviewed the above documentation for accuracy and completeness, and I agree with the above.

## 2021-05-04 NOTE — Telephone Encounter (Signed)
Scheduled appt per 11/15 staff msg from [employer]. Pt is aware of appt date and time.

## 2021-05-05 ENCOUNTER — Inpatient Hospital Stay: Payer: 59 | Attending: Hematology and Oncology | Admitting: Hematology and Oncology

## 2021-05-05 ENCOUNTER — Encounter: Payer: Self-pay | Admitting: *Deleted

## 2021-05-05 ENCOUNTER — Other Ambulatory Visit: Payer: Self-pay

## 2021-05-05 ENCOUNTER — Telehealth: Payer: Self-pay | Admitting: *Deleted

## 2021-05-05 DIAGNOSIS — D0511 Intraductal carcinoma in situ of right breast: Secondary | ICD-10-CM | POA: Insufficient documentation

## 2021-05-05 NOTE — Assessment & Plan Note (Signed)
Screening mammogram detected right breast mass at 4 o'clock position 10 cm from the nipple measuring 2.8 x 1.5 x 3.3 cm, axilla negative, biopsy revealed low to intermediate grade DCIS involving papillary lesion, ER/PR pending  Pathology review: I discussed with the patient the difference between DCIS and invasive breast cancer. It is considered a precancerous lesion. DCIS is classified as a 0. It is generally detected through mammograms as calcifications. We discussed the significance of grades and its impact on prognosis. We also discussed the importance of ER and PR receptors and their implications to adjuvant treatment options. Prognosis of DCIS dependence on grade, comedo necrosis. It is anticipated that if not treated, 20-30% of DCIS can develop into invasive breast cancer.  Recommendation: 1. Breast conserving surgery 2. Followed by adjuvant radiation therapy 3. Followed by antiestrogen therapy with tamoxifen 5 years (if she is ER/PR positive)  Tamoxifen counseling: We discussed the risks and benefits of tamoxifen. These include but not limited to insomnia, hot flashes, mood changes, vaginal dryness, and weight gain. Although rare, serious side effects including endometrial cancer, risk of blood clots were also discussed. We strongly believe that the benefits far outweigh the risks. Patient understands these risks and consented to starting treatment. Planned treatment duration is 5 years.  Return to clinic after surgery to discuss the final pathology report and come up with an adjuvant treatment plan.

## 2021-05-05 NOTE — Telephone Encounter (Signed)
Spoke to pt, provided navigation resources and contact information. Discussed ER/PR status of 100% for both. Received verbal understanding. Denies questions or concerns regarding dx or treatment care plan. Encourage pt to call with needs.

## 2021-05-06 DIAGNOSIS — Z113 Encounter for screening for infections with a predominantly sexual mode of transmission: Secondary | ICD-10-CM | POA: Diagnosis not present

## 2021-05-09 ENCOUNTER — Other Ambulatory Visit (HOSPITAL_COMMUNITY): Payer: Self-pay

## 2021-05-09 DIAGNOSIS — M654 Radial styloid tenosynovitis [de Quervain]: Secondary | ICD-10-CM | POA: Diagnosis not present

## 2021-05-09 MED ORDER — GABAPENTIN 300 MG PO CAPS
ORAL_CAPSULE | ORAL | 0 refills | Status: DC
  Filled 2021-05-09: qty 30, 15d supply, fill #0

## 2021-05-16 ENCOUNTER — Ambulatory Visit: Payer: Self-pay | Admitting: Surgery

## 2021-05-16 ENCOUNTER — Telehealth: Payer: Self-pay

## 2021-05-16 ENCOUNTER — Other Ambulatory Visit (HOSPITAL_COMMUNITY): Payer: Self-pay

## 2021-05-16 DIAGNOSIS — D0511 Intraductal carcinoma in situ of right breast: Secondary | ICD-10-CM

## 2021-05-16 MED ORDER — FLUCONAZOLE 150 MG PO TABS
ORAL_TABLET | ORAL | 0 refills | Status: DC
  Filled 2021-05-16: qty 2, 3d supply, fill #0

## 2021-05-16 NOTE — Telephone Encounter (Signed)
Patient notified of completion of Critical Illness Claim Form. Copy placed at Registration Desk for pick-up as requested. Patient stated that she would mail the form because she had to put additional information with the form.

## 2021-05-17 ENCOUNTER — Encounter: Payer: Self-pay | Admitting: *Deleted

## 2021-05-17 ENCOUNTER — Other Ambulatory Visit: Payer: Self-pay | Admitting: Surgery

## 2021-05-17 DIAGNOSIS — D0511 Intraductal carcinoma in situ of right breast: Secondary | ICD-10-CM

## 2021-05-17 NOTE — Progress Notes (Signed)
New Breast Cancer Diagnosis: Right Breast  Did patient present with symptoms (if so, please note symptoms) or screening mammography?:Screening Mass    Location and Extent of disease :right breast. Located at 4 o'clock position, measured 0.7 cm in greatest dimension. Adenopathy no.  Histology per Pathology Report: grade Low-intermediate, DCIS involving papillary lesion. 04/28/2021   Receptor Status: ER(positive), PR (positive), Her2-neu (), Ki-(%)  Surgeon and surgical plan, if any:  Dr. Brantley Stage -Right breast lumpectomy with radioactive seed localization 05/26/2021  Medical oncologist, treatment if any:   Dr. Lindi Adie 05/05/2021 -Recommendation: 1. Breast conserving surgery 2. Followed by adjuvant radiation therapy 3. Followed by antiestrogen therapy with tamoxifen 5 years (if she is ER/PR positive)   Family History of Breast/Ovarian/Prostate Cancer: Maternal Grandmother had breast, Maternal Aunt had breast.  Lymphedema issues, if any: No     Pain issues, if any: None     SAFETY ISSUES: Prior radiation? No Pacemaker/ICD? No Possible current pregnancy? Having Cycles, Tubal Ligation Is the patient on methotrexate? No  Current Complaints / other details:

## 2021-05-18 ENCOUNTER — Encounter: Payer: Self-pay | Admitting: Radiation Oncology

## 2021-05-18 ENCOUNTER — Ambulatory Visit
Admission: RE | Admit: 2021-05-18 | Discharge: 2021-05-18 | Disposition: A | Discharge status: Home / Self Care | Payer: 59 | Source: Ambulatory Visit | Attending: Radiation Oncology | Admitting: Radiation Oncology

## 2021-05-18 ENCOUNTER — Other Ambulatory Visit: Payer: Self-pay

## 2021-05-18 ENCOUNTER — Telehealth: Payer: Self-pay | Admitting: Hematology and Oncology

## 2021-05-18 VITALS — BP 138/96 | HR 81 | Temp 98.6°F | Resp 20 | Ht 69.0 in | Wt 210.2 lb

## 2021-05-18 DIAGNOSIS — F909 Attention-deficit hyperactivity disorder, unspecified type: Secondary | ICD-10-CM | POA: Insufficient documentation

## 2021-05-18 DIAGNOSIS — Z170 Estrogen receptor positive status [ER+]: Secondary | ICD-10-CM | POA: Insufficient documentation

## 2021-05-18 DIAGNOSIS — D0511 Intraductal carcinoma in situ of right breast: Secondary | ICD-10-CM

## 2021-05-18 DIAGNOSIS — E210 Primary hyperparathyroidism: Secondary | ICD-10-CM | POA: Diagnosis not present

## 2021-05-18 DIAGNOSIS — F411 Generalized anxiety disorder: Secondary | ICD-10-CM | POA: Diagnosis not present

## 2021-05-18 DIAGNOSIS — K219 Gastro-esophageal reflux disease without esophagitis: Secondary | ICD-10-CM | POA: Diagnosis not present

## 2021-05-18 DIAGNOSIS — N132 Hydronephrosis with renal and ureteral calculous obstruction: Secondary | ICD-10-CM | POA: Insufficient documentation

## 2021-05-18 DIAGNOSIS — Z8616 Personal history of COVID-19: Secondary | ICD-10-CM | POA: Diagnosis not present

## 2021-05-18 DIAGNOSIS — Z803 Family history of malignant neoplasm of breast: Secondary | ICD-10-CM | POA: Diagnosis not present

## 2021-05-18 DIAGNOSIS — K449 Diaphragmatic hernia without obstruction or gangrene: Secondary | ICD-10-CM | POA: Insufficient documentation

## 2021-05-18 DIAGNOSIS — Z87442 Personal history of urinary calculi: Secondary | ICD-10-CM | POA: Diagnosis not present

## 2021-05-18 DIAGNOSIS — Z79899 Other long term (current) drug therapy: Secondary | ICD-10-CM | POA: Insufficient documentation

## 2021-05-18 NOTE — Telephone Encounter (Signed)
Scheduled appt per 11/29 sch msg. Left msg for pt with appt date and time.

## 2021-05-18 NOTE — Progress Notes (Signed)
Radiation Oncology         [phone] ________________________________  Name: Misty [name]        MRN: [id number]  Date of Service: 05/18/2021 DOB: 12/23/1975  CC:[name], MD  Nicholas Lose, MD     REFERRING PHYSICIAN: Nicholas Lose, MD   DIAGNOSIS: The encounter diagnosis was Ductal carcinoma in situ (DCIS) of right breast.   HISTORY OF PRESENT ILLNESS: Misty [name] is a 45 y.o. female seen at the request of Dr. Lindi Adie for a newly diagnosed right breast cancer. She was found to have a screening detected mass on mammography and diagnostic imaging identified a mass in the 4:00 position of the right breast measuring 7 mm. No right axillary adenopathy was noted. A biopsy on 04/28/21 showed a low to intermediate grade DCIS involving a papillary lesion that was ER/PR positive. She's met with Dr. Brantley Stage and has plans for lumpectomy on 05/26/21. She's met Dr. Lindi Adie and is seen today to discuss adjuvant radiotherapy.    PREVIOUS RADIATION THERAPY: No   PAST MEDICAL HISTORY:  Past Medical History:  Diagnosis Date   ADHD (attention deficit hyperactivity disorder)    Anemia    Environmental and seasonal allergies    Frequency of urination    GAD (generalized anxiety disorder)    Ganglion cyst of wrist, left    GERD (gastroesophageal reflux disease)    History of COVID-19 06/12/2020   result in epic;  per pt mild symptoms that resolved   History of kidney stones    Hyperparathyroidism, primary ([place])    currently followed by pcp  (previously seen endocrinologist-- dr Louie Boston note in epic 08-29-2017, doctor moved away)   Tenosynovitis, de Quervain    left wrist   Wears glasses        PAST SURGICAL HISTORY: Past Surgical History:  Procedure Laterality Date   CYSTO W/ RIGHT URETEROSCOPY  2006   CYSTO/ RIGHT URETERAL STENT PLACEMENT  07-29-2009   LARGE RIGHT RENAL CALCULUS   CYSTOLITHOLAPAXY OF LARGE BLADDER CALCULI/   RIGHT URETERAL STENT EXCHANGE  04-15-2010    CYSTOSCOPY WITH STENT PLACEMENT  07/04/2012   Procedure: CYSTOSCOPY WITH STENT PLACEMENT;  Surgeon: Franchot Gallo, MD;  Location: [facility];  Service: Urology;  Laterality: Right;  rt dbl j stent placement    CYSTOSCOPY/RETROGRADE/URETEROSCOPY/STONE EXTRACTION WITH BASKET  07/25/2012   Procedure: CYSTOSCOPY/RETROGRADE/URETEROSCOPY/STONE EXTRACTION WITH BASKET;  Surgeon: Franchot Gallo, MD;  Location[facility];  Service: Urology;  Laterality: Right;   EXTRACORPOREAL SHOCK WAVE LITHOTRIPSY  08-02-2009   RIGHT   HOLMIUM LASER APPLICATION  08/17/5398   Procedure: HOLMIUM LASER APPLICATION;  Surgeon: Franchot Gallo, MD;  Location: [facility];  Service: Urology;  Laterality: Right;   LAPAROSCOPIC TUBAL LIGATION Bilateral 01/17/2013   Procedure: LAPAROSCOPIC TUBAL LIGATION;  Surgeon: Melina Schools, MD;  Location: [facility];  Service: Gynecology;  Laterality: Bilateral;  1hr OR time    MINOR RELEASE DORSAL COMPARTMENT (DEQUERVAINS) Left 04/26/2021   Procedure: MINOR RELEASE DORSAL COMPARTMENT (DEQUERVAINS) WITH GANGLION EXCISION;  Surgeon: Renette Butters, MD;  Location: [facility];  Service: Orthopedics;  Laterality: Left;     FAMILY HISTORY:  Family History  Problem Relation Age of Onset   Breast cancer Maternal Aunt    Breast cancer Maternal Grandmother    Esophageal cancer Maternal Uncle    Stomach cancer Neg Hx    Colon cancer Neg Hx    Pancreatic cancer Neg Hx  SOCIAL HISTORY:  reports that she has never smoked. She has never used smokeless tobacco. She reports current alcohol use. She reports that she does not use drugs. The patient is married and lives in [place]. She has 5 children, the youngest is 34. 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   ALLERGIES: Dilaudid [hydromorphone hcl], Ceftriaxone sodium, and Latex   MEDICATIONS:  Current Outpatient Medications   Medication Sig Dispense Refill   ALPRAZolam (XANAX) 1 MG tablet Take 1 tablet (1 mg total) by mouth 4 (four) times daily as needed (fill 03/22/21) 360 tablet 0   amphetamine-dextroamphetamine (ADDERALL) 20 MG tablet Take 1 tablet (20 mg total) by mouth 3 (three) times daily (fill 03/25/21) (Patient taking differently: Take 20 mg by mouth 3 (three) times daily.) 270 tablet 0   Ascorbic Acid (VITAMIN C PO) Take by mouth daily.     baclofen (LIORESAL) 10 MG tablet Take 1 tablet (10 mg total) by mouth 2 (two) times daily as needed Limit 1-2 treatments per week (Patient taking differently: Take 10 mg by mouth 2 (two) times daily as needed.) 20 tablet 0   Cholecalciferol (VITAMIN D3 PO) Take by mouth daily.     fluconazole (DIFLUCAN) 150 MG tablet Take 1 tablet by mouth now and repeat in 3 days if symptoms persist. 2 tablet 0   gabapentin (NEURONTIN) 300 MG capsule Take 1 capsule by mouth twice a day as needed for pain 30 capsule 0   HYDROcodone-acetaminophen (NORCO) 10-325 MG tablet Take 1 tablet by mouth every 6 (six) hours as needed for severe pain. 28 tablet 0   meloxicam (MOBIC) 15 MG tablet Take 1 tablet (15 mg total) by mouth once a day for pain and inflammation 30 tablet 0   montelukast (SINGULAIR) 10 MG tablet Take 1 tablet by mouth every evening (Patient taking differently: Take 10 mg by mouth at bedtime.) 90 tablet 0   omeprazole (PRILOSEC) 40 MG capsule TAKE 1 CAPSULE BY MOUTH ONCE DAILY BEFORE BREAKFAST (Patient taking differently: Take 40 mg by mouth daily.) 30 capsule 10   ondansetron (ZOFRAN ODT) 4 MG disintegrating tablet Dissolve1 tablet (4 mg total) by mouth 2 (two) times daily as needed for nausea or vomiting. 6 tablet 0   triamcinolone ointment (KENALOG) 0.1 % APPLY A THIN LAYER TO THE AFFECTED AREA(S) OF SKIN 2 TIMES PER DAY 30 g 0   zonisamide (ZONEGRAN) 25 MG capsule Take 4 capsules (100 mg total) by mouth every evening. (Patient taking differently: Take 100 mg by mouth as needed.)  120 capsule 1   No current facility-administered medications for this encounter.     REVIEW OF SYSTEMS: On review of systems, the patient reports that she is doing pretty well but felt like she was confronted with so many things at once. She had left wrist surgery, a breast biopsy, a kidney stone, and diagnosis of cancer all within about a week's time. She does not note any complaints of pain. No there concerns are verbalized.      PHYSICAL EXAM:  Wt Readings from Last 3 Encounters:  05/05/21 208 lb 12.8 oz (94.7 kg)  04/26/21 208 lb 1.6 oz (94.4 kg)  05/23/19 207 lb (93.9 kg)   Temp Readings from Last 3 Encounters:  05/05/21 (!) 97.5 F (36.4 C) (Temporal)  04/29/21 98.2 F (36.8 C) (Oral)  04/26/21 98.7 F (37.1 C)   BP Readings from Last 3 Encounters:  05/05/21 Marland Kitchen)  134/96  04/29/21 (!) 133/94  04/26/21 138/83   Pulse Readings from Last 3 Encounters:  05/05/21 78  04/29/21 79  04/26/21 61    In general this is a well appearing African American female in no acute distress. She's alert and oriented x4 and appropriate throughout the examination. Cardiopulmonary assessment is negative for acute distress and she exhibits normal effort. Bilateral breast exam is deferred.    ECOG = 0  0 - Asymptomatic (Fully active, able to carry on all predisease activities without restriction)  1 - Symptomatic but completely ambulatory (Restricted in physically strenuous activity but ambulatory and able to carry out work of a light or sedentary nature. For example, light housework, office work)  2 - Symptomatic, <50% in bed during the day (Ambulatory and capable of all self care but unable to carry out any work activities. Up and about more than 50% of waking hours)  3 - Symptomatic, >50% in bed, but not bedbound (Capable of only limited self-care, confined to bed or chair 50% or more of waking hours)  4 - Bedbound (Completely disabled. Cannot carry on any self-care. Totally confined to  bed or chair)  5 - Death   Eustace Pen MM, Creech RH, Tormey DC, et al. [phone]). "Toxicity and response criteria of the [facility]". [place] Oncol. 5 (6): 649-55    LABORATORY DATA:  Lab Results  Component Value Date   WBC 6.3 04/29/2021   HGB 14.4 04/29/2021   HCT 46.1 (H) 04/29/2021   MCV 80.0 04/29/2021   PLT 253 04/29/2021   Lab Results  Component Value Date   NA 139 04/29/2021   K 3.5 04/29/2021   CL 105 04/29/2021   CO2 25 04/29/2021   Lab Results  Component Value Date   ALT 25 04/29/2021   AST 21 04/29/2021   ALKPHOS 59 04/29/2021   BILITOT 0.5 04/29/2021      RADIOGRAPHY: CT Renal Stone Study  Result Date: 04/29/2021 CLINICAL DATA:  Flank pain, kidney stone suspected. History of kidney stones. Laterality not specified. EXAM: CT ABDOMEN AND PELVIS WITHOUT CONTRAST TECHNIQUE: Multidetector CT imaging of the abdomen and pelvis was performed following the standard protocol without IV contrast. COMPARISON:  Abdominopelvic CT 10/18/2017. FINDINGS: Lower chest: Clear lung bases. No significant pleural or pericardial effusion. There is a small hiatal hernia. There is a small amount of soft tissue emphysema medially in the right breast related to recent right breast biopsy (yesterday). Hepatobiliary: The liver appears unremarkable as imaged in the noncontrast state. No evidence of gallstones, gallbladder wall thickening or biliary dilatation. Pancreas: Unremarkable. No pancreatic ductal dilatation or surrounding inflammatory changes. Spleen: Normal in size without focal abnormality. Adrenals/Urinary Tract: The adrenal glands appear stable, without suspicious findings. There are numerous caliceal calculi on the right which have increased in size and number, largest in the lower pole measuring 10 mm on image 39/2. There is a tiny nonobstructing calculus in the upper pole of the left kidney. There is moderate right-sided hydronephrosis and hydroureter with mild  right-sided perinephric soft tissue stranding secondary to an obstructing 5 mm calculus at the right ureterovesical junction. This is seen on image 71/2 and is visible on the scout image. There is an adjacent tiny calculus in the bladder. No evidence of left ureteral calculus. A simple cyst in the interpolar region of the left kidney has mildly enlarged, measuring 3.8 cm on image 31/2. More inferiorly in the lower pole of the left kidney, there is a  new mild hyperdense lesion measuring 47 HU and 1.8 cm on image 34/2. Stomach/Bowel: No enteric contrast administered. As above, there is a small hiatal hernia. The stomach appears unremarkable for its degree of distension. No evidence of bowel wall thickening, distention or surrounding inflammatory change. The appendix appears normal. Vascular/Lymphatic: There are no enlarged abdominal or pelvic lymph nodes. No significant vascular findings on noncontrast imaging. Reproductive: Enlarging uterine fibroids are noted. No suspicious adnexal findings. Other: Stable small periumbilical hernia containing only fat. No ascites. Musculoskeletal: No acute or significant osseous findings. IMPRESSION: 1. Obstructing 5 mm calculus at the right ureterovesical junction. Additional tiny calculus in the bladder. 2. Bilateral nephrolithiasis with interval progression on the right. 3. New indeterminate, mildly hyperdense lesion in the lower pole of the left kidney, incompletely characterized without contrast. This is likely a cyst. Confirmation with ultrasound at urology follow-up recommended. 4. Enlarging uterine fibroids. 5. Recent right breast biopsy. Electronically Signed   By: Richardean Sale M.D.   On: 04/29/2021 10:37   MM CLIP PLACEMENT RIGHT  Result Date: 04/28/2021 CLINICAL DATA:  Status post ultrasound-guided core biopsy of RIGHT breast mass. EXAM: 3D DIAGNOSTIC RIGHT MAMMOGRAM POST ULTRASOUND BIOPSY COMPARISON:  Previous exam(s). FINDINGS: 3D Mammographic images were  obtained following ultrasound guided biopsy of RIGHT breast mass and placement of a tribell clip. Due to the posterior location of the lesion, the clip is not visualized despite additional views. IMPRESSION: Tribell clip is not visible on mammographic images secondary to its posterior location. Final Assessment: Post Procedure Mammograms for Marker Placement Electronically Signed   By: Nolon Nations M.D.   On: 04/28/2021 14:10  [place] RT BREAST BX W LOC DEV 1ST LESION IMG BX SPEC US GUIDE  Addendum Date: 05/04/2021   ADDENDUM REPORT: 05/04/2021 09:50 ADDENDUM: Pathology revealed LOW / INTERMEDIATE GRADE DUCTAL CARCINOMA IN SITU INVOLVING PAPILLARY LESIONS of the RIGHT breast, 4 o'clock, tribell clip. This was found to be concordant by Dr. Nolon Nations. Pathology results were discussed with the patient by telephone. The patient reported doing well after the biopsy with tenderness at the site. Post biopsy instructions and care were reviewed and questions were answered. The patient was encouraged to call [facility] for any additional concerns. Surgical consultation has been arranged with Dr. Erroll Luna at [facility] on May 16, 2021. Pathology results reported by Stacie Acres RN on 05/04/2021. Electronically Signed   By: Nolon Nations M.D.   On: 05/04/2021 09:50   Result Date: 05/04/2021 CLINICAL DATA:  Patient presents for ultrasound-guided core biopsy of RIGHT breast lesion. EXAM: ULTRASOUND GUIDED RIGHT BREAST CORE NEEDLE BIOPSY COMPARISON:  Previous exam(s). PROCEDURE: I met with the patient and we discussed the procedure of ultrasound-guided biopsy, including benefits and alternatives. We discussed the high likelihood of a successful procedure. We discussed the risks of the procedure, including infection, bleeding, tissue injury, clip migration, and inadequate sampling. Informed written consent was given. The usual time-out protocol was performed  immediately prior to the procedure. Lesion quadrant: LOWER INNER QUADRANT RIGHT breast Using sterile technique and 1% Lidocaine as local anesthetic, under direct ultrasound visualization, a 12 gauge spring-loaded device was used to perform biopsy of mass in the 4 o'clock location of the RIGHT breast using a inferior to superior approach. At the conclusion of the procedure tribell tissue marker clip was deployed into the biopsy cavity. Follow up 2 view mammogram was performed and dictated separately. IMPRESSION: Ultrasound guided biopsy of RIGHT breast mass. No apparent  complications. Electronically Signed: By: Nolon Nations M.D. On: 04/28/2021 14:11      IMPRESSION/PLAN: 1. Low to intermediate grade, ER/PR positive DCIS of the right breast. Dr. Lisbeth Renshaw discusses the pathology findings and reviews the nature of right breast disease. Dr. Brantley Stage has recommended breast conservation with lumpectomy. Dr. Lisbeth Renshaw recommends adjuvant external radiotherapy to the breast  to reduce risks of local recurrence followed by antiestrogen therapy. We discussed the risks, benefits, short, and long term effects of radiotherapy, as well as the curative intent, and the patient is interested in proceeding. Dr. Lisbeth Renshaw discusses the delivery and logistics of radiotherapy and anticipates a course of 6 1/2 weeks of radiotherapy to the right breast. We will see her back a few weeks after surgery to discuss the simulation process and anticipate we starting radiotherapy about 4-6 weeks after surgery.  2. Possible genetic predisposition to malignancy. The patient is a candidate for genetic testing given her personal and family history. She was offered referral and would like to meet genetics after her upcoming surgery.   In a visit lasting 60 minutes, greater than 50% of the time was spent face to face reviewing her case, as well as in preparation of, discussing, and coordinating the patient's care.  The above documentation reflects my  direct findings during this shared patient visit. Please see the separate note by Dr. Lisbeth Renshaw on this date for the remainder of the patient's plan of care.    Carola Rhine, [facility]    **Disclaimer: This note was dictated with voice recognition software. Similar sounding words can inadvertently be transcribed and this note may contain transcription errors which may not have been corrected upon publication of note.**

## 2021-05-19 ENCOUNTER — Telehealth: Payer: Self-pay | Admitting: Genetic Counselor

## 2021-05-19 ENCOUNTER — Encounter (HOSPITAL_BASED_OUTPATIENT_CLINIC_OR_DEPARTMENT_OTHER): Payer: Self-pay | Admitting: Surgery

## 2021-05-19 ENCOUNTER — Other Ambulatory Visit: Payer: Self-pay

## 2021-05-19 HISTORY — PX: BREAST LUMPECTOMY: SHX2

## 2021-05-19 NOTE — Telephone Encounter (Signed)
Scheduled appt per 11/30 referral. Called pt, no answer. Left msg for pt with appt date and time. Appt is for genetics.

## 2021-05-20 ENCOUNTER — Other Ambulatory Visit (HOSPITAL_COMMUNITY): Payer: Self-pay

## 2021-05-20 DIAGNOSIS — G479 Sleep disorder, unspecified: Secondary | ICD-10-CM | POA: Diagnosis not present

## 2021-05-20 DIAGNOSIS — C50911 Malignant neoplasm of unspecified site of right female breast: Secondary | ICD-10-CM | POA: Diagnosis not present

## 2021-05-20 DIAGNOSIS — Z13220 Encounter for screening for lipoid disorders: Secondary | ICD-10-CM | POA: Diagnosis not present

## 2021-05-20 DIAGNOSIS — Z1211 Encounter for screening for malignant neoplasm of colon: Secondary | ICD-10-CM | POA: Diagnosis not present

## 2021-05-20 DIAGNOSIS — J45990 Exercise induced bronchospasm: Secondary | ICD-10-CM | POA: Diagnosis not present

## 2021-05-20 DIAGNOSIS — Z0000 Encounter for general adult medical examination without abnormal findings: Secondary | ICD-10-CM | POA: Diagnosis not present

## 2021-05-20 DIAGNOSIS — E559 Vitamin D deficiency, unspecified: Secondary | ICD-10-CM | POA: Diagnosis not present

## 2021-05-20 DIAGNOSIS — Z79899 Other long term (current) drug therapy: Secondary | ICD-10-CM | POA: Diagnosis not present

## 2021-05-20 MED ORDER — ZOLPIDEM TARTRATE 5 MG PO TABS
ORAL_TABLET | ORAL | 0 refills | Status: DC
  Filled 2021-05-20: qty 30, 30d supply, fill #0

## 2021-05-23 ENCOUNTER — Encounter: Payer: Self-pay | Admitting: *Deleted

## 2021-05-23 NOTE — Progress Notes (Signed)
[facility] Psychosocial Distress Screening Clinical Social Work  Clinical Social Work was referred by distress screening protocol.  The patient scored a 9 on the Psychosocial Distress Thermometer which indicates severe distress. Clinical Social Worker contacted patient by phone to assess for distress and other psychosocial needs.  Patient stated she was doing "ok" after getting additional information on her treatment plan.  Patient stated she was scheduled for surgery on Thursday (12/8).  CSW provided education on CSW role and Support Team at [facility].  CSW and patient discussed support programs and resources at [facility].  CSW and patient discussed [place] [facility] [place] and [facility].  Patient plans to review web sites and application requirements, and will contact CSW if she is interested in applying.  CSW provided patient with contact information and encouraged her to call with questions or concerns.       ONCBCN DISTRESS SCREENING 05/18/2021  Screening Type Initial Screening  Distress experienced in past week (1-10) 9  Practical problem type Insurance  Emotional problem type Nervousness/Anxiety  Physical Problem type Sleep/insomnia  Other contact by [place], MSW, LCSW, OSW-C Clinical Social Worker [facility] [phone]         Kerry Odonohue P, LCSW

## 2021-05-24 ENCOUNTER — Other Ambulatory Visit: Payer: Self-pay

## 2021-05-24 ENCOUNTER — Other Ambulatory Visit: Payer: Self-pay | Admitting: Surgery

## 2021-05-24 ENCOUNTER — Ambulatory Visit
Admission: RE | Admit: 2021-05-24 | Discharge: 2021-05-24 | Disposition: A | Discharge status: Home / Self Care | Payer: 59 | Source: Ambulatory Visit | Attending: Surgery | Admitting: Surgery

## 2021-05-24 DIAGNOSIS — D0511 Intraductal carcinoma in situ of right breast: Secondary | ICD-10-CM

## 2021-05-24 DIAGNOSIS — C50911 Malignant neoplasm of unspecified site of right female breast: Secondary | ICD-10-CM | POA: Diagnosis not present

## 2021-05-24 NOTE — Progress Notes (Signed)

## 2021-05-25 NOTE — Anesthesia Preprocedure Evaluation (Addendum)
Anesthesia Evaluation  Patient identified by MRN, date of birth, ID band Patient awake    Reviewed: Allergy & Precautions, H&P , NPO status , Patient's Chart, lab work & pertinent test results  History of Anesthesia Complications (+) POST - OP SPINAL HEADACHE  Airway Mallampati: II  TM Distance: >3 FB Neck ROM: Full    Dental no notable dental hx. (+) Partial Upper, Teeth Intact, Dental Advisory Given,    Pulmonary neg pulmonary ROS,    Pulmonary exam normal breath sounds clear to auscultation       Cardiovascular negative cardio ROS Normal cardiovascular exam Rhythm:Regular Rate:Normal     Neuro/Psych PSYCHIATRIC DISORDERS Anxiety negative neurological ROS  negative psych ROS   GI/Hepatic negative GI ROS, Neg liver ROS, GERD  Controlled and Medicated,  Endo/Other  negative endocrine ROS  Renal/GU Renal diseasenegative Renal ROS  negative genitourinary   Musculoskeletal negative musculoskeletal ROS (+)   Abdominal   Peds negative pediatric ROS (+) ADHD Hematology negative hematology ROS (+) Blood dyscrasia, anemia ,   Anesthesia Other Findings   Reproductive/Obstetrics negative OB ROS hcg negative                            Anesthesia Physical  Anesthesia Plan  ASA: 2  Anesthesia Plan: General   Post-op Pain Management: Ofirmev IV (intra-op)   Induction: Intravenous  PONV Risk Score and Plan: 3 and Dexamethasone, Ondansetron, Midazolam and Treatment may vary due to age or medical condition  Airway Management Planned: LMA  Additional Equipment: None  Intra-op Plan:   Post-operative Plan: Extubation in OR  Informed Consent: I have reviewed the patients History and Physical, chart, labs and discussed the procedure including the risks, benefits and alternatives for the proposed anesthesia with the patient or authorized representative who has indicated his/her understanding and  acceptance.     Dental advisory given  Plan Discussed with: CRNA and Anesthesiologist  Anesthesia Plan Comments:        Anesthesia Quick Evaluation

## 2021-05-26 ENCOUNTER — Encounter (HOSPITAL_BASED_OUTPATIENT_CLINIC_OR_DEPARTMENT_OTHER): Payer: Self-pay | Admitting: Surgery

## 2021-05-26 ENCOUNTER — Other Ambulatory Visit (HOSPITAL_COMMUNITY): Payer: Self-pay

## 2021-05-26 ENCOUNTER — Ambulatory Visit (HOSPITAL_BASED_OUTPATIENT_CLINIC_OR_DEPARTMENT_OTHER): Payer: 59 | Admitting: Anesthesiology

## 2021-05-26 ENCOUNTER — Ambulatory Visit
Admission: RE | Admit: 2021-05-26 | Discharge: 2021-05-26 | Disposition: A | Discharge status: Home / Self Care | Payer: 59 | Source: Ambulatory Visit | Attending: Surgery | Admitting: Surgery

## 2021-05-26 ENCOUNTER — Other Ambulatory Visit: Payer: Self-pay

## 2021-05-26 ENCOUNTER — Ambulatory Visit (HOSPITAL_BASED_OUTPATIENT_CLINIC_OR_DEPARTMENT_OTHER)
Admission: RE | Admit: 2021-05-26 | Discharge: 2021-05-26 | Disposition: A | Discharge status: Home / Self Care | Payer: 59 | Attending: Surgery | Admitting: Surgery

## 2021-05-26 ENCOUNTER — Encounter (HOSPITAL_BASED_OUTPATIENT_CLINIC_OR_DEPARTMENT_OTHER)
Admission: RE | Disposition: A | Discharge status: Home / Self Care | Payer: Self-pay | Source: Home / Self Care | Attending: Surgery

## 2021-05-26 DIAGNOSIS — R928 Other abnormal and inconclusive findings on diagnostic imaging of breast: Secondary | ICD-10-CM | POA: Diagnosis not present

## 2021-05-26 DIAGNOSIS — D0511 Intraductal carcinoma in situ of right breast: Secondary | ICD-10-CM

## 2021-05-26 DIAGNOSIS — Z79899 Other long term (current) drug therapy: Secondary | ICD-10-CM | POA: Diagnosis not present

## 2021-05-26 DIAGNOSIS — K219 Gastro-esophageal reflux disease without esophagitis: Secondary | ICD-10-CM | POA: Diagnosis not present

## 2021-05-26 DIAGNOSIS — N6081 Other benign mammary dysplasias of right breast: Secondary | ICD-10-CM | POA: Diagnosis not present

## 2021-05-26 DIAGNOSIS — N62 Hypertrophy of breast: Secondary | ICD-10-CM | POA: Diagnosis not present

## 2021-05-26 HISTORY — PX: BREAST LUMPECTOMY WITH RADIOACTIVE SEED LOCALIZATION: SHX6424

## 2021-05-26 LAB — POCT PREGNANCY, URINE: Preg Test, Ur: NEGATIVE

## 2021-05-26 SURGERY — BREAST LUMPECTOMY WITH RADIOACTIVE SEED LOCALIZATION
Anesthesia: General | Site: Breast | Laterality: Right

## 2021-05-26 MED ORDER — PROPOFOL 10 MG/ML IV BOLUS
INTRAVENOUS | Status: DC | PRN
  Administered 2021-05-26: 200 mg via INTRAVENOUS

## 2021-05-26 MED ORDER — MIDAZOLAM HCL 5 MG/5ML IJ SOLN
INTRAMUSCULAR | Status: DC | PRN
  Administered 2021-05-26: 2 mg via INTRAVENOUS

## 2021-05-26 MED ORDER — FENTANYL CITRATE (PF) 100 MCG/2ML IJ SOLN
INTRAMUSCULAR | Status: AC
  Filled 2021-05-26: qty 2

## 2021-05-26 MED ORDER — LIDOCAINE HCL (CARDIAC) PF 100 MG/5ML IV SOSY
PREFILLED_SYRINGE | INTRAVENOUS | Status: DC | PRN
  Administered 2021-05-26: 60 mg via INTRATRACHEAL

## 2021-05-26 MED ORDER — PROPOFOL 10 MG/ML IV BOLUS
INTRAVENOUS | Status: AC
  Filled 2021-05-26: qty 20

## 2021-05-26 MED ORDER — OXYCODONE HCL 5 MG PO TABS
ORAL_TABLET | ORAL | Status: AC
  Filled 2021-05-26: qty 1

## 2021-05-26 MED ORDER — DEXAMETHASONE SODIUM PHOSPHATE 10 MG/ML IJ SOLN
INTRAMUSCULAR | Status: AC
  Filled 2021-05-26: qty 1

## 2021-05-26 MED ORDER — CLINDAMYCIN PHOSPHATE 900 MG/50ML IV SOLN
INTRAVENOUS | Status: AC
  Filled 2021-05-26: qty 50

## 2021-05-26 MED ORDER — ACETAMINOPHEN 10 MG/ML IV SOLN
1000.0000 mg | Freq: Once | INTRAVENOUS | Status: AC
  Administered 2021-05-26: 1000 mg via INTRAVENOUS

## 2021-05-26 MED ORDER — MEPERIDINE HCL 25 MG/ML IJ SOLN: 6.2500 mg | INTRAMUSCULAR | Status: DC | PRN

## 2021-05-26 MED ORDER — OXYCODONE HCL 5 MG/5ML PO SOLN: 5.0000 mg | Freq: Once | ORAL | Status: AC | PRN

## 2021-05-26 MED ORDER — FENTANYL CITRATE (PF) 100 MCG/2ML IJ SOLN
INTRAMUSCULAR | Status: DC | PRN
  Administered 2021-05-26: 50 ug via INTRAVENOUS
  Administered 2021-05-26 (×2): 25 ug via INTRAVENOUS

## 2021-05-26 MED ORDER — MIDAZOLAM HCL 2 MG/2ML IJ SOLN
INTRAMUSCULAR | Status: AC
  Filled 2021-05-26: qty 2

## 2021-05-26 MED ORDER — ACETAMINOPHEN 160 MG/5ML PO SOLN: 325.0000 mg | ORAL | Status: DC | PRN

## 2021-05-26 MED ORDER — SODIUM CHLORIDE 0.9 % IV SOLN
INTRAVENOUS | Status: DC | PRN
  Administered 2021-05-26: 250 mL

## 2021-05-26 MED ORDER — CHLORHEXIDINE GLUCONATE CLOTH 2 % EX PADS: 6.0000 | MEDICATED_PAD | Freq: Once | CUTANEOUS | Status: DC

## 2021-05-26 MED ORDER — ONDANSETRON HCL 4 MG/2ML IJ SOLN
INTRAMUSCULAR | Status: AC
  Filled 2021-05-26: qty 2

## 2021-05-26 MED ORDER — BUPIVACAINE-EPINEPHRINE (PF) 0.25% -1:200000 IJ SOLN
INTRAMUSCULAR | Status: AC
  Filled 2021-05-26: qty 30

## 2021-05-26 MED ORDER — VANCOMYCIN HCL 500 MG IV SOLR
INTRAVENOUS | Status: AC
  Filled 2021-05-26: qty 10

## 2021-05-26 MED ORDER — BUPIVACAINE-EPINEPHRINE (PF) 0.25% -1:200000 IJ SOLN
INTRAMUSCULAR | Status: DC | PRN
  Administered 2021-05-26: 20 mL via PERINEURAL

## 2021-05-26 MED ORDER — ONDANSETRON HCL 4 MG/2ML IJ SOLN: 4.0000 mg | Freq: Once | INTRAMUSCULAR | Status: DC | PRN

## 2021-05-26 MED ORDER — DEXAMETHASONE SODIUM PHOSPHATE 10 MG/ML IJ SOLN
INTRAMUSCULAR | Status: DC | PRN
  Administered 2021-05-26: 5 mg via INTRAVENOUS

## 2021-05-26 MED ORDER — OXYCODONE HCL 5 MG PO TABS
5.0000 mg | ORAL_TABLET | Freq: Once | ORAL | Status: AC | PRN
  Administered 2021-05-26: 5 mg via ORAL

## 2021-05-26 MED ORDER — LACTATED RINGERS IV SOLN: INTRAVENOUS | Status: DC

## 2021-05-26 MED ORDER — HYDROCODONE-ACETAMINOPHEN 5-325 MG PO TABS
1.0000 | ORAL_TABLET | Freq: Four times a day (QID) | ORAL | 0 refills | Status: DC | PRN
  Filled 2021-05-26: qty 15, 4d supply, fill #0

## 2021-05-26 MED ORDER — ACETAMINOPHEN 325 MG PO TABS: 325.0000 mg | ORAL_TABLET | ORAL | Status: DC | PRN

## 2021-05-26 MED ORDER — ONDANSETRON HCL 4 MG/2ML IJ SOLN
INTRAMUSCULAR | Status: DC | PRN
  Administered 2021-05-26: 4 mg via INTRAVENOUS

## 2021-05-26 MED ORDER — LIDOCAINE 2% (20 MG/ML) 5 ML SYRINGE
INTRAMUSCULAR | Status: AC
  Filled 2021-05-26: qty 5

## 2021-05-26 MED ORDER — IBUPROFEN 800 MG PO TABS
800.0000 mg | ORAL_TABLET | Freq: Three times a day (TID) | ORAL | 0 refills | Status: DC | PRN
  Filled 2021-05-26: qty 30, 10d supply, fill #0

## 2021-05-26 MED ORDER — FENTANYL CITRATE (PF) 100 MCG/2ML IJ SOLN: 25.0000 ug | INTRAMUSCULAR | Status: DC | PRN

## 2021-05-26 MED ORDER — ACETAMINOPHEN 10 MG/ML IV SOLN
INTRAVENOUS | Status: AC
  Filled 2021-05-26: qty 100

## 2021-05-26 MED ORDER — CLINDAMYCIN PHOSPHATE 900 MG/50ML IV SOLN
900.0000 mg | INTRAVENOUS | Status: AC
  Administered 2021-05-26: 900 mg via INTRAVENOUS

## 2021-05-26 SURGICAL SUPPLY — 48 items
ADH SKN CLS APL DERMABOND .7 (GAUZE/BANDAGES/DRESSINGS) ×1
APL PRP STRL LF DISP 70% ISPRP (MISCELLANEOUS) ×1
APPLIER CLIP 9.375 MED OPEN (MISCELLANEOUS)
APR CLP MED 9.3 20 MLT OPN (MISCELLANEOUS)
BINDER BREAST XXLRG (GAUZE/BANDAGES/DRESSINGS) ×1 IMPLANT
BLADE SURG 15 STRL LF DISP TIS (BLADE) ×1 IMPLANT
BLADE SURG 15 STRL SS (BLADE) ×2
CANISTER SUC SOCK COL 7IN (MISCELLANEOUS) IMPLANT
CANISTER SUCT 1200ML W/VALVE (MISCELLANEOUS) IMPLANT
CHLORAPREP W/TINT 26 (MISCELLANEOUS) ×2 IMPLANT
CLIP APPLIE 9.375 MED OPEN (MISCELLANEOUS) IMPLANT
COVER BACK TABLE 60X90IN (DRAPES) ×2 IMPLANT
COVER MAYO STAND STRL (DRAPES) ×2 IMPLANT
COVER PROBE W GEL 5X96 (DRAPES) ×2 IMPLANT
DECANTER SPIKE VIAL GLASS SM (MISCELLANEOUS) IMPLANT
DERMABOND ADVANCED (GAUZE/BANDAGES/DRESSINGS) ×1
DERMABOND ADVANCED .7 DNX12 (GAUZE/BANDAGES/DRESSINGS) ×1 IMPLANT
DRAPE LAPAROSCOPIC ABDOMINAL (DRAPES) IMPLANT
DRAPE LAPAROTOMY 100X72 PEDS (DRAPES) ×2 IMPLANT
DRAPE UTILITY XL STRL (DRAPES) ×2 IMPLANT
ELECT COATED BLADE 2.86 ST (ELECTRODE) ×2 IMPLANT
ELECT REM PT RETURN 9FT ADLT (ELECTROSURGICAL) ×2
ELECTRODE REM PT RTRN 9FT ADLT (ELECTROSURGICAL) ×1 IMPLANT
GLOVE SRG 8 PF TXTR STRL LF DI (GLOVE) ×1 IMPLANT
GLOVE SURG LTX SZ8 (GLOVE) ×2 IMPLANT
GLOVE SURG UNDER POLY LF SZ8 (GLOVE) ×2
GOWN STRL REUS W/ TWL LRG LVL3 (GOWN DISPOSABLE) ×2 IMPLANT
GOWN STRL REUS W/ TWL XL LVL3 (GOWN DISPOSABLE) ×1 IMPLANT
GOWN STRL REUS W/TWL LRG LVL3 (GOWN DISPOSABLE) ×4
GOWN STRL REUS W/TWL XL LVL3 (GOWN DISPOSABLE) ×2
HEMOSTAT ARISTA ABSORB 3G PWDR (HEMOSTASIS) IMPLANT
HEMOSTAT SNOW SURGICEL 2X4 (HEMOSTASIS) IMPLANT
KIT MARKER MARGIN INK (KITS) ×2 IMPLANT
NDL HYPO 25X1 1.5 SAFETY (NEEDLE) ×1 IMPLANT
NEEDLE HYPO 25X1 1.5 SAFETY (NEEDLE) ×2 IMPLANT
NS IRRIG 1000ML POUR BTL (IV SOLUTION) ×2 IMPLANT
PACK BASIN DAY SURGERY FS (CUSTOM PROCEDURE TRAY) ×2 IMPLANT
PENCIL SMOKE EVACUATOR (MISCELLANEOUS) ×2 IMPLANT
SLEEVE SCD COMPRESS KNEE MED (STOCKING) ×2 IMPLANT
SPONGE T-LAP 4X18 ~~LOC~~+RFID (SPONGE) ×2 IMPLANT
SUT MNCRL AB 4-0 PS2 18 (SUTURE) ×2 IMPLANT
SUT SILK 2 0 SH (SUTURE) IMPLANT
SUT VICRYL 3-0 CR8 SH (SUTURE) ×2 IMPLANT
SYR CONTROL 10ML LL (SYRINGE) ×2 IMPLANT
TOWEL GREEN STERILE FF (TOWEL DISPOSABLE) ×2 IMPLANT
TRAY FAXITRON CT DISP (TRAY / TRAY PROCEDURE) ×2 IMPLANT
TUBE CONNECTING 20X1/4 (TUBING) IMPLANT
YANKAUER SUCT BULB TIP NO VENT (SUCTIONS) IMPLANT

## 2021-05-26 NOTE — Op Note (Signed)
Preoperative diagnosis: Right breast DCIS lower inner quadrant  Postoperative diagnosis: Same  Procedure: Right breast seed localized lumpectomy  Surgeon: Erroll Luna, MD  Anesthesia: LMA with 0.25% Marcaine with epinephrine  EBL: 20 cc  Specimen: Right breast tissue in seed and clip verified by Faxitron  Drains: None  Indications for procedure: The patient is a 45 year old female with DCIS diagnosed by screening mammogram and core biopsy.  She was seen and evaluated by surgery, medical and radiation oncology.  All options were discussed.  She opted for breast conserving surgery after reviewing all of her options.The procedure has been discussed with the patient. Alternatives to surgery have been discussed with the patient.  Risks of surgery include bleeding,  Infection,  Seroma formation, death,  and the need for further surgery.   The patient understands and wishes to proceed.    Description of procedure: The patient was met in the holding area and questions were answered.  Right breast was marked as the correct site and films were available for review.  She had no further questions.  She was taken to the operating room.  She was placed supine upon the OR table.  After induction of general anesthesia, right breast was prepped and draped in sterile fashion timeout performed.  Proper patient, site and procedure verified.  Neoprobe used to find the seed in the lower inner quadrant of the right breast along the inferior mammary fold.  Local anesthetic was infiltrated.  Incision made.  Dissection was carried down around the tissue using the inferior mammary fold approach.  All tissue was excised with a grossly negative margin.  Irrigation was used.  The Faxitron revealed the seed and clip to be present in the specimen.  This was oriented with ink and sent to pathology.  Clips were placed to mark the cavity.  Local anesthetic was infiltrated.  The incision was then closed with a deep layer 3-0  Vicryl and 4 Monocryl.  Dermabond applied.  All final counts found to be correct.  Breast binder placed.  The patient was awoke extubated and taken to PACU in satisfactory condition.

## 2021-05-26 NOTE — Transfer of Care (Signed)
Immediate Anesthesia Transfer of Care Note  Patient: Misty [name]  Procedure(s) Performed: RIGHT BREAST LUMPECTOMY WITH RADIOACTIVE SEED LOCALIZATION (Right: Breast)  Patient Location: PACU  Anesthesia Type:General  Level of Consciousness: awake, alert  and oriented  Airway & Oxygen Therapy: Patient Spontanous Breathing and Patient connected to face mask oxygen  Post-op Assessment: Report given to RN and Post -op Vital signs reviewed and stable  Post vital signs: Reviewed and stable  Last Vitals:  Vitals Value Taken Time  BP    Temp    Pulse 92 05/26/21 0813  Resp    SpO2 93 % 05/26/21 0813  Vitals shown include unvalidated device data.  Last Pain:  Vitals:   05/26/21 0635  TempSrc: Oral  PainSc: 8       Patients Stated Pain Goal: 4 (20/03/79 4446)  Complications: No notable events documented.

## 2021-05-26 NOTE — Anesthesia Postprocedure Evaluation (Signed)
Anesthesia Post Note  Patient: Misty [name]  Procedure(s) Performed: RIGHT BREAST LUMPECTOMY WITH RADIOACTIVE SEED LOCALIZATION (Right: Breast)     Patient location during evaluation: PACU Anesthesia Type: General Level of consciousness: awake and alert Pain management: pain level controlled Vital Signs Assessment: post-procedure vital signs reviewed and stable Respiratory status: spontaneous breathing, nonlabored ventilation, respiratory function stable and patient connected to nasal cannula oxygen Cardiovascular status: blood pressure returned to baseline and stable Postop Assessment: no apparent nausea or vomiting Anesthetic complications: no   No notable events documented.  Last Vitals:  Vitals:   05/26/21 0845 05/26/21 0925  BP: 130/83 (!) 124/92  Pulse: 72 74  Resp: 18 14  Temp:  (!) 36.4 C  SpO2: 96% 96%    Last Pain:  Vitals:   05/26/21 0925  TempSrc:   PainSc: 4                  Asheton Scheffler

## 2021-05-26 NOTE — Interval H&P Note (Signed)
History and Physical Interval Note:  05/26/2021 7:18 AM  Misty [name]  has presented today for surgery, with the diagnosis of RIGHT DCIS.  The various methods of treatment have been discussed with the patient and family. After consideration of risks, benefits and other options for treatment, the patient has consented to  Procedure(s): RIGHT BREAST LUMPECTOMY WITH RADIOACTIVE SEED LOCALIZATION (Right) as a surgical intervention.  The patient's history has been reviewed, patient examined, no change in status, stable for surgery.  I have reviewed the patient's chart and labs.  Questions were answered to the patient's satisfaction.     [place]

## 2021-05-26 NOTE — Discharge Instructions (Addendum)
[facility] Office Phone Number [phone]  BREAST BIOPSY/ PARTIAL MASTECTOMY: POST OP INSTRUCTIONS  Always review your discharge instruction sheet given to you by the facility where your surgery was performed.  IF YOU HAVE DISABILITY OR FAMILY LEAVE FORMS, YOU MUST BRING THEM TO THE OFFICE FOR PROCESSING.  DO NOT GIVE THEM TO YOUR DOCTOR.  A prescription for pain medication may be given to you upon discharge.  Take your pain medication as prescribed, if needed.  If narcotic pain medicine is not needed, then you may take acetaminophen (Tylenol) or ibuprofen (Advil) as needed. Take your usually prescribed medications unless otherwise directed If you need a refill on your pain medication, please contact your pharmacy.  They will contact our office to request authorization.  Prescriptions will not be filled after 5pm or on week-ends. You should eat very light the first 24 hours after surgery, such as soup, crackers, pudding, etc.  Resume your normal diet the day after surgery. Most patients will experience some swelling and bruising in the breast.  Ice packs and a good support bra will help.  Swelling and bruising can take several days to resolve.  It is common to experience some constipation if taking pain medication after surgery.  Increasing fluid intake and taking a stool softener will usually help or prevent this problem from occurring.  A mild laxative (Milk of Magnesia or Miralax) should be taken according to package directions if there are no bowel movements after 48 hours. Unless discharge instructions indicate otherwise, you may remove your bandages 24-48 hours after surgery, and you may shower at that time.  You may have steri-strips (small skin tapes) in place directly over the incision.  These strips should be left on the skin for 7-10 days.  If your surgeon used skin glue on the incision, you may shower in 24 hours.  The glue will flake off over the next 2-3 weeks.  Any  sutures or staples will be removed at the office during your follow-up visit. ACTIVITIES:  You may resume regular daily activities (gradually increasing) beginning the next day.  Wearing a good support bra or sports bra minimizes pain and swelling.  You may have sexual intercourse when it is comfortable. You may drive when you no longer are taking prescription pain medication, you can comfortably wear a seatbelt, and you can safely maneuver your car and apply brakes. RETURN TO WORK:  ______________________________________________________________________________________ You should see your doctor in the office for a follow-up appointment approximately two weeks after your surgery.  Your doctor's nurse will typically make your follow-up appointment when she calls you with your pathology report.  Expect your pathology report 2-3 business days after your surgery.  You may call to check if you do not hear from us after three days. OTHER INSTRUCTIONS: _______________________________________________________________________________________________ _____________________________________________________________________________________________________________________________________ _____________________________________________________________________________________________________________________________________ _____________________________________________________________________________________________________________________________________  WHEN TO CALL YOUR DOCTOR: Fever over 101.0 Nausea and/or vomiting. Extreme swelling or bruising. Continued bleeding from incision. Increased pain, redness, or drainage from the incision.  The clinic staff is available to answer your questions during regular business hours.  Please don't hesitate to call and ask to speak to one of the nurses for clinical concerns.  If you have a medical emergency, go to the nearest emergency room or call 911.  A surgeon from [facility] is always on call at the hospital.  For further questions, please visit [email or web address]    Post Anesthesia Home Care Instructions  Activity: Get plenty of rest for the remainder of   the day. A responsible individual must stay with you for 24 hours following the procedure.  For the next 24 hours, DO NOT: -Drive a car -Operate machinery -Drink alcoholic beverages -Take any medication unless instructed by your physician -Make any legal decisions or sign important papers.  Meals: Start with liquid foods such as gelatin or soup. Progress to regular foods as tolerated. Avoid greasy, spicy, heavy foods. If nausea and/or vomiting occur, drink only clear liquids until the nausea and/or vomiting subsides. Call your physician if vomiting continues.  Special Instructions/Symptoms: Your throat may feel dry or sore from the anesthesia or the breathing tube placed in your throat during surgery. If this causes discomfort, gargle with warm salt water. The discomfort should disappear within 24 hours.  If you had a scopolamine patch placed behind your ear for the management of post- operative nausea and/or vomiting:  1. The medication in the patch is effective for 72 hours, after which it should be removed.  Wrap patch in a tissue and discard in the trash. Wash hands thoroughly with soap and water. 2. You may remove the patch earlier than 72 hours if you experience unpleasant side effects which may include dry mouth, dizziness or visual disturbances. 3. Avoid touching the patch. Wash your hands with soap and water after contact with the patch.      

## 2021-05-26 NOTE — Anesthesia Procedure Notes (Signed)
Procedure Name: LMA Insertion Date/Time: 05/26/2021 7:32 AM Performed by: Glory Buff, CRNA Pre-anesthesia Checklist: Patient identified, Emergency Drugs available, Suction available and Patient being monitored Patient Re-evaluated:Patient Re-evaluated prior to induction Oxygen Delivery Method: Circle system utilized Preoxygenation: Pre-oxygenation with 100% oxygen Induction Type: IV induction LMA: LMA inserted LMA Size: 4.0 Number of attempts: 1 Placement Confirmation: positive ETCO2 and breath sounds checked- equal and bilateral Tube secured with: Tape Dental Injury: Teeth and Oropharynx as per pre-operative assessment

## 2021-05-26 NOTE — H&P (Signed)
History of Present Illness: Misty [name] is a 45 y.o. female who is seen today as an office consultation at the request of Dr. Ulanda Edison for evaluation of breast cancer .   Patient presents for right breast DCIS diagnosed on screening mammogram and subsequent diagnostic mammogram, ultrasound core biopsy. She has an area 3.5 cm arising from papillary lesion with DCIS. No history of breast pain. No history of breast mass or nipple discharge. Positive family history of breast cancer.  Review of Systems: A complete review of systems was obtained from the patient. I have reviewed this information and discussed as appropriate with the patient. See HPI as well for other ROS.    Medical History: History reviewed. No pertinent past medical history.  There is no problem list on file for this patient.  History reviewed. No pertinent surgical history.   Not on File  No current outpatient medications on file prior to visit.   No current facility-administered medications on file prior to visit.   History reviewed. No pertinent family history.   Social History   Tobacco Use  Smoking Status Not on file  Smokeless Tobacco Not on file    Social History   Socioeconomic History   Marital status: Married   Objective:  There were no vitals filed for this visit.  There is no height or weight on file to calculate BMI.  Physical Exam Constitutional:  Appearance: Normal appearance.  HENT:  Head: Normocephalic.  Eyes:  General: No scleral icterus. Pupils: Pupils are equal, round, and reactive to light.  Cardiovascular:  Rate and Rhythm: Normal rate.  Pulmonary:  Effort: Pulmonary effort is normal.  Breath sounds: No stridor.  Chest:  Breasts: Right: Normal. No inverted nipple or mass.  Left: Normal. No inverted nipple or mass.  Musculoskeletal:  General: Normal range of motion.  Cervical back: Normal range of motion.  Lymphadenopathy:  Upper Body:  Right upper body: No  supraclavicular or axillary adenopathy.  Left upper body: No supraclavicular or axillary adenopathy.  Skin: General: Skin is warm and dry.  Neurological:  Mental Status: She is alert.  Psychiatric:  Mood and Affect: Mood normal.  Behavior: Behavior normal.     Labs, Imaging and Diagnostic Testing: The patient was called back for a right breast asymmetry.   EXAM: DIGITAL DIAGNOSTIC UNILATERAL RIGHT MAMMOGRAM WITH TOMOSYNTHESIS AND CAD; ULTRASOUND RIGHT BREAST LIMITED   TECHNIQUE: Right digital diagnostic mammography and breast tomosynthesis was performed. The images were evaluated with computer-aided detection.; Targeted ultrasound examination of the right breast was performed   COMPARISON: Previous exam(s).   ACR Breast Density Category c: The breast tissue is heterogeneously dense, which may obscure small masses.   FINDINGS: The right breast asymmetry persists on today's imaging.   On physical exam, no suspicious lumps are identified.   Targeted ultrasound is performed, showing an irregular mass in the region of the right breast focal asymmetry seen at 4 o'clock, 10 cm from the nipple at a posterior depth measuring 2.8 x 1.5 by 3.3 cm. No axillary adenopathy.   IMPRESSION: Indeterminate mass in the right breast at 4 o'clock, 10 cm from the nipple thought to correlate with the mammographic finding.   RECOMMENDATION: Recommend ultrasound-guided biopsy of the right breast mass.   I have discussed the findings and recommendations with the patient. If applicable, a reminder letter will be sent to the patient regarding the next appointment.   BI-RADS CATEGORY 4: Suspicious.     Electronically Signed By: Dorise Bullion III  M.D. On: 04/15/2021 11:39  ADDITIONAL INFORMATION: PROGNOSTIC INDICATORS Results: IMMUNOHISTOCHEMICAL AND MORPHOMETRIC ANALYSIS PERFORMED MANUALLY Estrogen Receptor: 100%, POSITIVE, STRONG STAINING INTENSITY Progesterone Receptor: 100%,  POSITIVE, STRONG STAINING INTENSITY REFERENCE RANGE ESTROGEN RECEPTOR NEGATIVE 0% POSITIVE =>1% REFERENCE RANGE PROGESTERONE RECEPTOR NEGATIVE 0% POSITIVE =>1% All controls stained appropriately Thressa Sheller MD Pathologist, Electronic Signature ( Signed 05/05/2021) FINAL DIAGNOSIS Diagnosis Breast, right, needle core biopsy, 4 o'clock, lower inner asymmetry, tribell clip - 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 Based on the biopsy, the ductal carcinoma in situ 1 of 2 FINAL for Misty [name], Misty [name] ([id number]) Microscopic Comment(continued) low-intermediate nuclear grade and measures 0.7 cm in greatest linear extent. Prognostic markers (ER/PR) are pending and will be reported in an addendum. Dr. Thornell Sartorius reviewed the case and agrees with the above diagnosis. These results were called to [facility] on May 03, 2021. Thressa Sheller MD Pathologist, Electronic Signature (Case signed 05/03/2021) Specimen Gross  Assessment and Plan:  Diagnoses and all orders for this visit:  Ductal carcinoma in situ (DCIS) of right breast   Patient is seen medical oncology already.  Discussed breast conserving surgery versus mastectomy with reconstruction. She is happy for right breast seed localized lumpectomy. Risks and benefits of surgery as well as long-term survival, recurrence rates and postoperative course discussed.The procedure has been discussed with the patient. Alternatives to surgery have been discussed with the patient. Risks of surgery include bleeding, Infection, Seroma formation, death, and the need for further surgery. The patient understands and wishes to proceed.  No follow-ups on file.  Kennieth Francois, MD

## 2021-05-27 ENCOUNTER — Telehealth: Payer: Self-pay | Admitting: Radiation Oncology

## 2021-05-27 ENCOUNTER — Encounter (HOSPITAL_BASED_OUTPATIENT_CLINIC_OR_DEPARTMENT_OTHER): Payer: Self-pay | Admitting: Surgery

## 2021-05-27 ENCOUNTER — Encounter: Payer: Self-pay | Admitting: *Deleted

## 2021-05-27 DIAGNOSIS — D0511 Intraductal carcinoma in situ of right breast: Secondary | ICD-10-CM

## 2021-05-27 NOTE — Telephone Encounter (Signed)
Called pt to sch a consultation w. Dr. Lisbeth Renshaw. Pt stated she would call back to sch appt.

## 2021-05-30 ENCOUNTER — Encounter: Payer: Self-pay | Admitting: Surgery

## 2021-05-30 LAB — SURGICAL PATHOLOGY

## 2021-05-31 ENCOUNTER — Encounter: Payer: Self-pay | Admitting: *Deleted

## 2021-06-02 ENCOUNTER — Inpatient Hospital Stay: Payer: 59 | Attending: Genetic Counselor | Admitting: Genetic Counselor

## 2021-06-02 ENCOUNTER — Other Ambulatory Visit: Payer: Self-pay | Admitting: *Deleted

## 2021-06-02 ENCOUNTER — Other Ambulatory Visit: Payer: Self-pay

## 2021-06-02 ENCOUNTER — Other Ambulatory Visit: Payer: Self-pay | Admitting: Genetic Counselor

## 2021-06-02 ENCOUNTER — Encounter: Payer: Self-pay | Admitting: Genetic Counselor

## 2021-06-02 ENCOUNTER — Inpatient Hospital Stay: Payer: 59

## 2021-06-02 DIAGNOSIS — Z79899 Other long term (current) drug therapy: Secondary | ICD-10-CM | POA: Insufficient documentation

## 2021-06-02 DIAGNOSIS — D0511 Intraductal carcinoma in situ of right breast: Secondary | ICD-10-CM | POA: Diagnosis not present

## 2021-06-02 DIAGNOSIS — Z803 Family history of malignant neoplasm of breast: Secondary | ICD-10-CM

## 2021-06-02 DIAGNOSIS — Z791 Long term (current) use of non-steroidal anti-inflammatories (NSAID): Secondary | ICD-10-CM | POA: Insufficient documentation

## 2021-06-02 DIAGNOSIS — Z853 Personal history of malignant neoplasm of breast: Secondary | ICD-10-CM | POA: Diagnosis not present

## 2021-06-02 DIAGNOSIS — Z170 Estrogen receptor positive status [ER+]: Secondary | ICD-10-CM | POA: Insufficient documentation

## 2021-06-02 DIAGNOSIS — Z1379 Encounter for other screening for genetic and chromosomal anomalies: Secondary | ICD-10-CM

## 2021-06-02 LAB — GENETIC SCREENING ORDER

## 2021-06-02 NOTE — Progress Notes (Signed)
REFERRING PROVIDER: Nicholas Lose, MD [address] [place] [address]  PRIMARY PROVIDER:  [name], MD  PRIMARY REASON FOR VISIT:  Encounter Diagnoses  Name Primary?   Ductal carcinoma in situ (DCIS) of right breast Yes   Family history of breast cancer    HISTORY OF PRESENT ILLNESS:   Misty [name], a 45 y.o. female, was seen for a [place] cancer genetics consultation at the request of Dr. Lindi Adie due to a personal and family history of cancer.  Misty [name] presents to clinic today to discuss the possibility of a hereditary predisposition to cancer, to discuss genetic testing, and to further clarify her future cancer risks, as well as potential cancer risks for family members.   Misty [name] has a personal history of ductal carcinoma in situ (DCIS) of the right breast diagnosed at age 64. She had a lumpectomy and has plans to have radiation therapy.   CANCER HISTORY:  Oncology History  Ductal carcinoma in situ (DCIS) of right breast  04/28/2021 Initial Diagnosis   Screening mammogram detected right breast mass at 4 o'clock position 10 cm from the nipple measuring 2.8 x 1.5 x 3.3 cm, axilla negative, biopsy revealed low to intermediate grade DCIS involving papillary lesion, ER/PR pending     RISK FACTORS:  Menarche was at age 38.  First live birth at age 34.  OCP use for approximately  <1  year.  Ovaries intact: yes.  Uterus intact: yes.  Menopausal status: premenopausal.  HRT use: 0 years. Colonoscopy: no Mammogram within the last year: yes Any excessive radiation exposure in the past: no  Past Medical History:  Diagnosis Date   ADHD (attention deficit hyperactivity disorder)    Anemia    Breast cancer ([place]) 04/28/2021   Environmental and seasonal allergies    Frequency of urination    GAD (generalized anxiety disorder)    Ganglion cyst of wrist, left    GERD (gastroesophageal reflux disease)    History of COVID-19 06/12/2020   result in epic;  per  pt mild symptoms that resolved   History of kidney stones    Hyperparathyroidism, primary ([place])    currently followed by pcp  (previously seen endocrinologist-- dr Louie Boston note in epic 08-29-2017, doctor moved away)   Tenosynovitis, de Quervain    left wrist   Wears glasses     Past Surgical History:  Procedure Laterality Date   BREAST LUMPECTOMY WITH RADIOACTIVE SEED LOCALIZATION Right 05/26/2021   Procedure: RIGHT BREAST LUMPECTOMY WITH RADIOACTIVE SEED LOCALIZATION;  Surgeon: Erroll Luna, MD;  Location: [facility];  Service: General;  Laterality: Right;   CYSTO W/ RIGHT URETEROSCOPY  2006   CYSTO/ RIGHT URETERAL STENT PLACEMENT  07-29-2009   LARGE RIGHT RENAL CALCULUS   CYSTOLITHOLAPAXY OF LARGE BLADDER CALCULI/   RIGHT URETERAL STENT EXCHANGE  04-15-2010   CYSTOSCOPY WITH STENT PLACEMENT  07/04/2012   Procedure: CYSTOSCOPY WITH STENT PLACEMENT;  Surgeon: Franchot Gallo, MD;  Location: [facility];  Service: Urology;  Laterality: Right;  rt dbl j stent placement    CYSTOSCOPY/RETROGRADE/URETEROSCOPY/STONE EXTRACTION WITH BASKET  07/25/2012   Procedure: CYSTOSCOPY/RETROGRADE/URETEROSCOPY/STONE EXTRACTION WITH BASKET;  Surgeon: Franchot Gallo, MD;  Location: [facility];  Service: Urology;  Laterality: Right;   EXTRACORPOREAL SHOCK WAVE LITHOTRIPSY  08-02-2009   RIGHT   HOLMIUM LASER APPLICATION  02/24/3531   Procedure: HOLMIUM LASER APPLICATION;  Surgeon: Franchot Gallo, MD;  Location: [facility];  Service: Urology;  Laterality:  Right;   LAPAROSCOPIC TUBAL LIGATION Bilateral 01/17/2013   Procedure: LAPAROSCOPIC TUBAL LIGATION;  Surgeon: Melina Schools, MD;  Location: [facility];  Service: Gynecology;  Laterality: Bilateral;  1hr OR time    MINOR RELEASE DORSAL COMPARTMENT (DEQUERVAINS) Left 04/26/2021   Procedure: MINOR RELEASE DORSAL COMPARTMENT (DEQUERVAINS) WITH GANGLION EXCISION;  Surgeon: Renette Butters, MD;   Location: [facility];  Service: Orthopedics;  Laterality: Left;    Social History   Socioeconomic History   Marital status: Married    Spouse name: Not on file   Number of children: Not on file   Years of education: Not on file   Highest education level: Not on file  Occupational History   Not on file  Tobacco Use   Smoking status: Never   Smokeless tobacco: Never  Vaping Use   Vaping Use: Never used  Substance and Sexual Activity   Alcohol use: Not Currently    Comment: occasional   Drug use: Never   Sexual activity: Not on file  Other Topics Concern   Not on file  Social History Narrative   Not on file   Social Determinants of Health   Financial Resource Strain: Not on file  Food Insecurity: Not on file  Transportation Needs: Not on file  Physical Activity: Not on file  Stress: Not on file  Social Connections: Not on file     FAMILY HISTORY:  We obtained a detailed, 4-generation family history.  Significant diagnoses are listed below: Family History  Problem Relation Age of Onset   Breast cancer Maternal Aunt    Esophageal cancer Maternal Uncle       Misty [name] reports a maternal uncle diagnosed with esophageal cancer, he smoked, he is deceased. She has a maternal aunt who was diagnosed with DCIS at age 71. Of note, she does not have any information about her paternal family medical history. Misty [name] is unaware of previous family history of genetic testing for hereditary cancer risks. There is no reported Ashkenazi Jewish ancestry.   GENETIC COUNSELING ASSESSMENT: Misty [name] is a 45 y.o. female with a personal and family history of cancer which is somewhat suggestive of a hereditary predisposition to cancer given her young age at diagnosis. We, therefore, discussed and recommended the following at today's visit.   DISCUSSION: We discussed that 5 - 10% of cancer is hereditary, with most cases of breast cancer associated with BRCA1/2.  There are  other genes that can be associated with hereditary breast cancer syndromes.  We discussed that testing is beneficial for several reasons including knowing how to follow individuals after completing their treatment, identifying whether potential treatment options would be beneficial, and understanding if other family members could be at risk for cancer and allowing them to undergo genetic testing.   We reviewed the characteristics, features and inheritance patterns of hereditary cancer syndromes. We also discussed genetic testing, including the appropriate family members to test, the process of testing, insurance coverage and turn-around-time for results. We discussed the implications of a negative, positive, carrier and/or variant of uncertain significant result. We recommended Misty [name] pursue genetic testing for a panel that includes genes associated with breast cancer.   Misty [name]  was offered a common hereditary cancer panel (47 genes) and an expanded pan-cancer panel (84 genes). Misty [name] was informed of the benefits and limitations of each panel, including that expanded pan-cancer panels contain genes that do not have clear management guidelines at this point in time.  We also discussed that as the number of genes included on a panel increases, the chances of variants of uncertain significance increases.  After considering the benefits and limitations of each gene panel, Misty [name] elected to have [facility] CustomNext Panel+RNA.  The CustomNext-Cancer+RNAinsight panel offered by [facility] includes sequencing and rearrangement analysis for the following 47 genes:  APC, ATM, AXIN2, BARD1, BMPR1A, BRCA1, BRCA2, BRIP1, CDH1, CDK4, CDKN2A, CHEK2, CTNNA1, DICER1, EPCAM, GREM1, HOXB13, KIT, MEN1, MLH1, MSH2, MSH3, MSH6, MUTYH, NBN, NF1, NTHL1, PALB2, PDGFRA, PMS2, POLD1, POLE, PTEN, RAD50, RAD51C, RAD51D, SDHA, SDHB, SDHC, SDHD, SMAD4, SMARCA4, STK11, TP53, TSC1, TSC2, and VHL.  RNA data is routinely  analyzed for use in variant interpretation for all genes.  Based on Misty [name] personal and family history of cancer, she meets medical criteria for genetic testing. Despite that she meets criteria, she may still have an out of pocket cost. We discussed that if her out of pocket cost for testing is over $100, the laboratory will call and confirm whether she wants to proceed with testing.  If the out of pocket cost of testing is less than $100 she will be billed by the genetic testing laboratory.   PLAN: After considering the risks, benefits, and limitations, Misty [name] provided informed consent to pursue genetic testing and the blood sample was sent to [facility] for analysis of the CustomNext Panel+RNA. Results should be available within approximately 2-3 weeks' time, at which point they will be disclosed by telephone to Misty [name], as will any additional recommendations warranted by these results. Misty [name] will receive a summary of her genetic counseling visit and a copy of her results once available. This information will also be available in Epic.   Misty [name] questions were answered to her satisfaction today. Our contact information was provided should additional questions or concerns arise. Thank you for the referral and allowing [place] to share in the care of your patient.   [name], MS, [facility] Genetic Counselor [email or web address] (P) [phone]  The patient was seen for a total of 30 minutes in face-to-face genetic counseling. The patient was seen alone.  Drs. Magrinat, Lindi Adie and/or Burr Medico were available to discuss this case as needed.   _______________________________________________________________________ For Office Staff:  Number of people involved in session: 1 Was an Intern/ student involved with case: no

## 2021-06-03 ENCOUNTER — Other Ambulatory Visit (HOSPITAL_COMMUNITY): Payer: Self-pay

## 2021-06-04 ENCOUNTER — Other Ambulatory Visit (HOSPITAL_COMMUNITY): Payer: Self-pay

## 2021-06-06 NOTE — Progress Notes (Signed)
Patient Care Team: [name], MD as PCP - General (Family Medicine) Mauro Kaufmann, RN as Oncology Nurse Navigator Rockwell Germany, RN as Oncology Nurse Navigator  DIAGNOSIS:    ICD-10-CM   1. Ductal carcinoma in situ (DCIS) of right breast  D05.11       SUMMARY OF ONCOLOGIC HISTORY: Oncology History  Ductal carcinoma in situ (DCIS) of right breast  04/28/2021 Initial Diagnosis   Screening mammogram detected right breast mass at 4 o'clock position 10 cm from the nipple measuring 2.8 x 1.5 x 3.3 cm, axilla negative, biopsy revealed low to intermediate grade DCIS involving papillary lesion, ER/PR positive   05/26/2021 Surgery   (Dr. Brantley Stage): Right lumpectomy: Intermediate grade DCIS involving a papillary lesion 5.2 cm, no evidence of invasive cancer, resection margins are negative, ER 100%, PR 100%     CHIEF COMPLIANT: Follow-up of right breast DCIS  INTERVAL HISTORY: Misty [name] is a 45 y.o. with above-mentioned history of right breast DCIS. Right lumpectomy on 05/26/2021 showed intermediate grade DCIS. She presents to the clinic today for follow-up.   ALLERGIES:  is allergic to dilaudid [hydromorphone hcl], ceftriaxone sodium, and latex.  MEDICATIONS:  Current Outpatient Medications  Medication Sig Dispense Refill   ALPRAZolam (XANAX) 1 MG tablet Take 1 tablet (1 mg total) by mouth 4 (four) times daily as needed (fill 03/22/21) 360 tablet 0   amphetamine-dextroamphetamine (ADDERALL) 20 MG tablet Take 1 tablet (20 mg total) by mouth 3 (three) times daily (fill 03/25/21) (Patient taking differently: Take 20 mg by mouth 3 (three) times daily.) 270 tablet 0   Ascorbic Acid (VITAMIN C PO) Take by mouth daily. (Patient not taking: Reported on 05/18/2021)     baclofen (LIORESAL) 10 MG tablet Take 1 tablet (10 mg total) by mouth 2 (two) times daily as needed Limit 1-2 treatments per week (Patient not taking: Reported on 05/18/2021) 20 tablet 0   Cholecalciferol (VITAMIN D3  PO) Take by mouth daily. (Patient not taking: Reported on 05/18/2021)     gabapentin (NEURONTIN) 300 MG capsule Take 1 capsule by mouth twice a day as needed for pain (Patient not taking: Reported on 05/18/2021) 30 capsule 0   HYDROcodone-acetaminophen (NORCO) 10-325 MG tablet Take 1 tablet by mouth every 6 (six) hours as needed for severe pain. (Patient not taking: Reported on 05/18/2021) 28 tablet 0   HYDROcodone-acetaminophen (NORCO/VICODIN) 5-325 MG tablet Take 1 tablet by mouth every 6 (six) hours as needed for moderate pain. 15 tablet 0   ibuprofen (ADVIL) 800 MG tablet Take 1 tablet (800 mg total) by mouth every 8 (eight) hours as needed. 30 tablet 0   meloxicam (MOBIC) 15 MG tablet Take 1 tablet (15 mg total) by mouth once a day for pain and inflammation 30 tablet 0   montelukast (SINGULAIR) 10 MG tablet Take 1 tablet by mouth every evening (Patient taking differently: Take 10 mg by mouth at bedtime.) 90 tablet 0   omeprazole (PRILOSEC) 40 MG capsule TAKE 1 CAPSULE BY MOUTH ONCE DAILY BEFORE BREAKFAST (Patient taking differently: Take 40 mg by mouth daily.) 30 capsule 10   ondansetron (ZOFRAN ODT) 4 MG disintegrating tablet Dissolve1 tablet (4 mg total) by mouth 2 (two) times daily as needed for nausea or vomiting. (Patient not taking: Reported on 05/18/2021) 6 tablet 0   SUMAtriptan (IMITREX) 50 MG tablet 1 tablet at onset of headache followed by 1 tablet 2 hours later if symptoms persist     SUMAtriptan (IMITREX) 50 MG tablet  Take 1 tablet by mouth at onset of headache followed by 1 tablet 2 hours later if symptoms persist 9 tablet 0   triamcinolone ointment (KENALOG) 0.1 % APPLY A THIN LAYER TO THE AFFECTED AREA(S) OF SKIN 2 TIMES PER DAY 30 g 0   zolpidem (AMBIEN) 5 MG tablet Take 1 tablet by mouth at bedtime as directed 30 tablet 0   zonisamide (ZONEGRAN) 25 MG capsule Take 4 capsules (100 mg total) by mouth every evening. (Patient not taking: Reported on 05/18/2021) 120 capsule 1   No  current facility-administered medications for this visit.    PHYSICAL EXAMINATION: ECOG PERFORMANCE STATUS: 1 - Symptomatic but completely ambulatory  Vitals:   06/07/21 1206  BP: (!) 137/98  Pulse: 80  Resp: 18  Temp: 97.6 F (36.4 C)  SpO2: 99%   Filed Weights   06/07/21 1206  Weight: 210 lb 3 oz (95.3 kg)      LABORATORY DATA:  I have reviewed the data as listed CMP Latest Ref Rng & Units 04/29/2021 05/13/2019 10/18/2017  Glucose 70 - 99 mg/dL 110(H) 92 103(H)  BUN 6 - 20 mg/dL 12 13 12   Creatinine 0.44 - 1.00 mg/dL 0.83 0.77 0.84  Sodium 135 - 145 mmol/L 139 138 136  Potassium 3.5 - 5.1 mmol/L 3.5 3.5 3.9  Chloride 98 - 111 mmol/L 105 105 104  CO2 22 - 32 mmol/L 25 27 23   Calcium 8.9 - 10.3 mg/dL 10.4(H) 10.5 10.0  Total Protein 6.5 - 8.1 g/dL 7.1 6.9 7.6  Total Bilirubin 0.3 - 1.2 mg/dL 0.5 0.4 0.7  Alkaline Phos 38 - 126 U/L 59 56 60  AST 15 - 41 U/L 21 13 32  ALT 0 - 44 U/L 25 16 38    Lab Results  Component Value Date   WBC 6.3 04/29/2021   HGB 14.4 04/29/2021   HCT 46.1 (H) 04/29/2021   MCV 80.0 04/29/2021   PLT 253 04/29/2021   NEUTROABS 3.7 04/29/2021    ASSESSMENT & PLAN:  Ductal carcinoma in situ (DCIS) of right breast 05/26/2021: (Dr. Brantley Stage): Right lumpectomy: Intermediate grade DCIS involving a papillary lesion 5.2 cm, no evidence of invasive cancer, resection margins are negative, ER 100%, PR 100%  Pathology counseling: I discussed the final pathology report of the patient provided  a copy of this report. I discussed the margins.  We also discussed the final staging along with previously performed ER/PR testing.  Treatment plan: 1. adjuvant radiation therapy 2. Followed by antiestrogen therapy with tamoxifen 5 years   Return to clinic at the end of radiation for follow-up    No orders of the defined types were placed in this encounter.  The patient has a good understanding of the overall plan. she agrees with it. she will call with any  problems that may develop before the next visit here.  Total time spent: 20 mins including face to face time and time spent for planning, charting and coordination of care  Rulon Eisenmenger, MD, MPH 06/07/2021  I, Thana Ates, am acting as scribe for Dr. Nicholas Lose.  I have reviewed the above documentation for accuracy and completeness, and I agree with the above.

## 2021-06-07 ENCOUNTER — Other Ambulatory Visit: Payer: Self-pay

## 2021-06-07 ENCOUNTER — Inpatient Hospital Stay: Payer: 59 | Admitting: Hematology and Oncology

## 2021-06-07 ENCOUNTER — Other Ambulatory Visit (HOSPITAL_COMMUNITY): Payer: Self-pay

## 2021-06-07 DIAGNOSIS — Z79899 Other long term (current) drug therapy: Secondary | ICD-10-CM | POA: Diagnosis not present

## 2021-06-07 DIAGNOSIS — Z170 Estrogen receptor positive status [ER+]: Secondary | ICD-10-CM | POA: Diagnosis not present

## 2021-06-07 DIAGNOSIS — D0511 Intraductal carcinoma in situ of right breast: Secondary | ICD-10-CM | POA: Diagnosis not present

## 2021-06-07 DIAGNOSIS — Z791 Long term (current) use of non-steroidal anti-inflammatories (NSAID): Secondary | ICD-10-CM | POA: Diagnosis not present

## 2021-06-07 MED ORDER — SUMATRIPTAN SUCCINATE 50 MG PO TABS
ORAL_TABLET | ORAL | 0 refills | Status: DC
  Filled 2021-06-07 – 2022-01-17 (×2): qty 9, 30d supply, fill #0

## 2021-06-07 NOTE — Assessment & Plan Note (Signed)
05/26/2021: (Dr. Brantley Stage): Right lumpectomy: Intermediate grade DCIS involving a papillary lesion 5.2 cm, no evidence of invasive cancer, resection margins are negative, ER 100%, PR 100%  Pathology counseling: I discussed the final pathology report of the patient provided  a copy of this report. I discussed the margins.  We also discussed the final staging along with previously performed ER/PR testing.  Treatment plan: 1. adjuvant radiation therapy 2. Followed by antiestrogen therapy with tamoxifen 5 years   Return to clinic at the end of radiation for follow-up

## 2021-06-10 ENCOUNTER — Telehealth: Payer: Self-pay

## 2021-06-10 NOTE — Telephone Encounter (Signed)
Called Patient at phone number [phone]. Unable to reach Patient. Left message regarding completion of FMLA Paperwork and that copy of paperwork  was placed at Registration Desk for Pick-up as requested. Fax transmission confirmation received from [employer].

## 2021-06-16 ENCOUNTER — Other Ambulatory Visit (HOSPITAL_COMMUNITY): Payer: Self-pay

## 2021-06-20 DIAGNOSIS — F3342 Major depressive disorder, recurrent, in full remission: Secondary | ICD-10-CM | POA: Diagnosis not present

## 2021-06-20 DIAGNOSIS — F4310 Post-traumatic stress disorder, unspecified: Secondary | ICD-10-CM | POA: Diagnosis not present

## 2021-06-22 ENCOUNTER — Telehealth: Payer: Self-pay | Admitting: Genetic Counselor

## 2021-06-22 ENCOUNTER — Encounter: Payer: Self-pay | Admitting: Genetic Counselor

## 2021-06-22 DIAGNOSIS — Z1379 Encounter for other screening for genetic and chromosomal anomalies: Secondary | ICD-10-CM | POA: Insufficient documentation

## 2021-06-22 NOTE — Telephone Encounter (Addendum)
I contacted Ms. Bucholz to discuss her genetic testing results. No pathogenic variants were identified in the 47 genes analyzed. Detailed clinic note to follow.  The test report has been scanned into EPIC and is located under the Molecular Pathology section of the Results Review tab.  A portion of the result report is included below for reference.   [name], MS, [facility] Genetic Counselor [email or web address] (P) [phone]

## 2021-06-22 NOTE — Telephone Encounter (Signed)
I attempted to contact Ms. Anastos to discuss her genetic testing results (47 genes). I left a voicemail requesting she call me back at [phone].  [name], MS, [facility] Genetic Counselor [email or web address] (P) [phone]

## 2021-06-24 ENCOUNTER — Ambulatory Visit: Payer: Self-pay | Admitting: Genetic Counselor

## 2021-06-24 DIAGNOSIS — D0511 Intraductal carcinoma in situ of right breast: Secondary | ICD-10-CM

## 2021-06-24 DIAGNOSIS — Z1379 Encounter for other screening for genetic and chromosomal anomalies: Secondary | ICD-10-CM

## 2021-06-24 NOTE — Progress Notes (Signed)
HPI:   Misty [name] was previously seen in the [facility] clinic due to a personal and family history of cancer and concerns regarding a hereditary predisposition to cancer. Please refer to our prior cancer genetics clinic note for more information regarding our discussion, assessment and recommendations, at the time. Misty [name] recent genetic test results were disclosed to her, as were recommendations warranted by these results. These results and recommendations are discussed in more detail below.  CANCER HISTORY:  Oncology History  Ductal carcinoma in situ (DCIS) of right breast  04/28/2021 Initial Diagnosis   Screening mammogram detected right breast mass at 4 o'clock position 10 cm from the nipple measuring 2.8 x 1.5 x 3.3 cm, axilla negative, biopsy revealed low to intermediate grade DCIS involving papillary lesion, ER/PR positive   05/26/2021 Surgery   (Dr. Brantley Stage): Right lumpectomy: Intermediate grade DCIS involving a papillary lesion 5.2 cm, no evidence of invasive cancer, resection margins are negative, ER 100%, PR 100%     FAMILY HISTORY:  We obtained a detailed, 4-generation family history.  Significant diagnoses are listed below:      Family History  Problem Relation Age of Onset   Breast cancer Maternal Aunt     Esophageal cancer Maternal Uncle          Misty [name] reports a maternal uncle diagnosed with esophageal cancer, he smoked, he is deceased. She has a maternal aunt who was diagnosed with DCIS at age 59. Of note, she does not have any information about her paternal family medical history. Misty [name] is unaware of previous family history of genetic testing for hereditary cancer risks. There is no reported Ashkenazi Jewish ancestry.   GENETIC TEST RESULTS:  The [facility] CustomNext Panel found no pathogenic mutations.   The CustomNext-Cancer+RNAinsight panel offered by [facility] includes sequencing and rearrangement analysis for the following 47 genes:   APC, ATM, AXIN2, BARD1, BMPR1A, BRCA1, BRCA2, BRIP1, CDH1, CDK4, CDKN2A, CHEK2, CTNNA1, DICER1, EPCAM, GREM1, HOXB13, KIT, MEN1, MLH1, MSH2, MSH3, MSH6, MUTYH, NBN, NF1, NTHL1, PALB2, PDGFRA, PMS2, POLD1, POLE, PTEN, RAD50, RAD51C, RAD51D, SDHA, SDHB, SDHC, SDHD, SMAD4, SMARCA4, STK11, TP53, TSC1, TSC2, and VHL.  RNA data is routinely analyzed for use in variant interpretation for all genes.  The test report has been scanned into EPIC and is located under the Molecular Pathology section of the Results Review tab.  A portion of the result report is included below for reference. Genetic testing reported out on 06/17/2021.        Even though a pathogenic variant was not identified, possible explanations for her personal history of cancer may include: There may be no hereditary risk for cancer in the family. The cancers in Misty [name] and/or her family may be due to other genetic or environmental factors. There may be a gene mutation in one of these genes that current testing methods cannot detect, but that chance is small. There could be another gene that has not yet been discovered, or that we have not yet tested, that is responsible for the cancer diagnoses in the family.   Therefore, it is important to remain in touch with cancer genetics in the future so that we can continue to offer Misty [name] the most up to date genetic testing.   ADDITIONAL GENETIC TESTING:  We discussed with Misty [name] that her genetic testing was fairly extensive.  If there are genes identified to increase cancer risk that can be analyzed in the future, we would be happy  to discuss and coordinate this testing at that time.    CANCER SCREENING RECOMMENDATIONS:  Misty [name] test result is considered negative (normal).  This means that we have not identified a hereditary cause for her personal and family history of cancer at this time. Most cancers happen by chance and this negative test suggests that her cancer may fall into  this category.    An individual's cancer risk and medical management are not determined by genetic test results alone. Overall cancer risk assessment incorporates additional factors, including personal medical history, family history, and any available genetic information that may result in a personalized plan for cancer prevention and surveillance. Therefore, it is recommended she continue to follow the cancer management and screening guidelines provided by her oncology and primary healthcare provider.  RECOMMENDATIONS FOR FAMILY MEMBERS:   Since she did not inherit a mutation in a cancer predisposition gene included on this panel, her children could not have inherited a mutation from her in one of these genes. Individuals in this family might be at some increased risk of developing cancer, over the general population risk, due to the family history of cancer. We recommend women in this family have a yearly mammogram beginning at age 48, or 37 years younger than the earliest onset of cancer, an annual clinical breast exam, and perform monthly breast self-exams.   FOLLOW-UP:  Cancer genetics is a rapidly advancing field and it is possible that new genetic tests will be appropriate for her and/or her family members in the future. We encouraged her to remain in contact with cancer genetics on an annual basis so we can update her personal and family histories and let her know of advances in cancer genetics that may benefit this family.   Our contact number was provided. Misty [name] questions were answered to her satisfaction, and she knows she is welcome to call us at anytime with additional questions or concerns.   [name], MS, [facility] Genetic Counselor [email or web address] (P) [phone]

## 2021-06-27 ENCOUNTER — Telehealth: Payer: Self-pay

## 2021-06-27 ENCOUNTER — Other Ambulatory Visit (HOSPITAL_COMMUNITY): Payer: Self-pay

## 2021-06-27 MED ORDER — ALPRAZOLAM 1 MG PO TABS
ORAL_TABLET | ORAL | 0 refills | Status: DC
  Filled 2021-06-27: qty 360, 90d supply, fill #0

## 2021-06-27 MED ORDER — AMPHETAMINE-DEXTROAMPHETAMINE 20 MG PO TABS
ORAL_TABLET | ORAL | 0 refills | Status: DC
Start: 2021-06-27 — End: 2021-10-14
  Filled 2021-06-27 (×2): qty 270, 90d supply, fill #0

## 2021-06-27 NOTE — Telephone Encounter (Signed)
Called patient and verified her identity. Reminded patient of her 9:00am -06/28/21 "in person" appointment w/ Shona Simpson PA-C and nurse. Patient states she is "Aware of appointment." Advised patient to arrive for check-in 15-30min prior to appointment time. Patient ended the call before I could verify that patient understood all information given.

## 2021-06-27 NOTE — Progress Notes (Signed)
Radiation Oncology         [phone] ________________________________  Name: Misty [name]        MRN: [id number]  Date of Service: 06/28/2021 DOB: 1976-05-17  CC:[name], MD  Nicholas Lose, MD     REFERRING PHYSICIAN: Nicholas Lose, MD   DIAGNOSIS: The encounter diagnosis was Ductal carcinoma in situ (DCIS) of right breast.   HISTORY OF PRESENT ILLNESS: Misty [name] is a 46 y.o. female seen at the request of Dr. Lindi Adie for a newly diagnosed right breast cancer. She was found to have a screening detected mass on mammography and diagnostic imaging identified a mass in the 4:00 position of the right breast measuring 7 mm. No right axillary adenopathy was noted. A biopsy on 04/28/21 showed a low to intermediate grade DCIS involving a papillary lesion that was ER/PR positive.   Since her last visit she did undergo lumpectomy with Dr. Brantley Stage on 05/26/2021. Final pathology showed intermediate grade DCIS involving a papillary lesion.  The tumor measured up to 5.2 cm.  No invasive disease was present.  Her closest margin was less than 1 mm focally to the medial, posterior, and anterior aspects of the specimen.  She is seen today to discuss adjuvant radiotherapy.   PREVIOUS RADIATION THERAPY: No   PAST MEDICAL HISTORY:  Past Medical History:  Diagnosis Date   ADHD (attention deficit hyperactivity disorder)    Anemia    Breast cancer ([place]) 04/28/2021   Environmental and seasonal allergies    Frequency of urination    GAD (generalized anxiety disorder)    Ganglion cyst of wrist, left    GERD (gastroesophageal reflux disease)    History of COVID-19 06/12/2020   result in epic;  per pt mild symptoms that resolved   History of kidney stones    Hyperparathyroidism, primary ([place])    currently followed by pcp  (previously seen endocrinologist-- dr Louie Boston note in epic 08-29-2017, doctor moved away)   Tenosynovitis, de Quervain    left wrist   Wears glasses        PAST  SURGICAL HISTORY: Past Surgical History:  Procedure Laterality Date   BREAST LUMPECTOMY WITH RADIOACTIVE SEED LOCALIZATION Right 05/26/2021   Procedure: RIGHT BREAST LUMPECTOMY WITH RADIOACTIVE SEED LOCALIZATION;  Surgeon: Erroll Luna, MD;  Location: [facility];  Service: General;  Laterality: Right;   CYSTO W/ RIGHT URETEROSCOPY  2006   CYSTO/ RIGHT URETERAL STENT PLACEMENT  07-29-2009   LARGE RIGHT RENAL CALCULUS   CYSTOLITHOLAPAXY OF LARGE BLADDER CALCULI/   RIGHT URETERAL STENT EXCHANGE  04-15-2010   CYSTOSCOPY WITH STENT PLACEMENT  07/04/2012   Procedure: CYSTOSCOPY WITH STENT PLACEMENT;  Surgeon: Franchot Gallo, MD;  Location: [facility];  Service: Urology;  Laterality: Right;  rt dbl j stent placement    CYSTOSCOPY/RETROGRADE/URETEROSCOPY/STONE EXTRACTION WITH BASKET  07/25/2012   Procedure: CYSTOSCOPY/RETROGRADE/URETEROSCOPY/STONE EXTRACTION WITH BASKET;  Surgeon: Franchot Gallo, MD;  Location[facility]  Service: Urology;  Laterality: Right;   EXTRACORPOREAL SHOCK WAVE LITHOTRIPSY  08-02-2009   RIGHT   HOLMIUM LASER APPLICATION  06/28/5091   Procedure: HOLMIUM LASER APPLICATION;  Surgeon: Franchot Gallo, MD;  Location: [facility];  Service: Urology;  Laterality: Right;   LAPAROSCOPIC TUBAL LIGATION Bilateral 01/17/2013   Procedure: LAPAROSCOPIC TUBAL LIGATION;  Surgeon: Melina Schools, MD;  Location: [facility];  Service: Gynecology;  Laterality: Bilateral;  1hr OR time    MINOR RELEASE DORSAL COMPARTMENT (DEQUERVAINS) Left 04/26/2021  Procedure: MINOR RELEASE DORSAL COMPARTMENT (DEQUERVAINS) WITH GANGLION EXCISION;  Surgeon: Renette Butters, MD;  Location: [facility];  Service: Orthopedics;  Laterality: Left;     FAMILY HISTORY:  Family History  Problem Relation Age of Onset   Breast cancer Maternal Aunt 68       DCIS   Esophageal cancer Maternal Uncle    Stomach cancer Neg Hx    Colon cancer  Neg Hx    Pancreatic cancer Neg Hx      SOCIAL HISTORY:  reports that she has never smoked. She has never used smokeless tobacco. She reports that she does not currently use alcohol. She reports that she does not use drugs. The patient is married and lives in [place]. She has 5 children, the youngest is 47. 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    ALLERGIES: Dilaudid [hydromorphone hcl], Ceftriaxone sodium, and Latex   MEDICATIONS:  Current Outpatient Medications  Medication Sig Dispense Refill   ALPRAZolam (XANAX) 1 MG tablet Take 1 tablet (1 mg total) by mouth 4 (four) times daily as needed (fill 03/22/21) 360 tablet 0   amphetamine-dextroamphetamine (ADDERALL) 20 MG tablet Take 1 tablet (20 mg total) by mouth 3 (three) times daily (fill 03/25/21) (Patient taking differently: Take 20 mg by mouth 3 (three) times daily.) 270 tablet 0   Ascorbic Acid (VITAMIN C PO) Take by mouth daily. (Patient not taking: Reported on 05/18/2021)     baclofen (LIORESAL) 10 MG tablet Take 1 tablet (10 mg total) by mouth 2 (two) times daily as needed Limit 1-2 treatments per week (Patient not taking: Reported on 05/18/2021) 20 tablet 0   Cholecalciferol (VITAMIN D3 PO) Take by mouth daily. (Patient not taking: Reported on 05/18/2021)     gabapentin (NEURONTIN) 300 MG capsule Take 1 capsule by mouth twice a day as needed for pain (Patient not taking: Reported on 05/18/2021) 30 capsule 0   HYDROcodone-acetaminophen (NORCO) 10-325 MG tablet Take 1 tablet by mouth every 6 (six) hours as needed for severe pain. (Patient not taking: Reported on 05/18/2021) 28 tablet 0   HYDROcodone-acetaminophen (NORCO/VICODIN) 5-325 MG tablet Take 1 tablet by mouth every 6 (six) hours as needed for moderate pain. 15 tablet 0   ibuprofen (ADVIL) 800 MG tablet Take 1 tablet (800 mg total) by mouth every 8 (eight) hours as needed. 30 tablet 0   meloxicam (MOBIC) 15 MG tablet Take 1 tablet (15  mg total) by mouth once a day for pain and inflammation 30 tablet 0   montelukast (SINGULAIR) 10 MG tablet Take 1 tablet by mouth every evening (Patient taking differently: Take 10 mg by mouth at bedtime.) 90 tablet 0   omeprazole (PRILOSEC) 40 MG capsule TAKE 1 CAPSULE BY MOUTH ONCE DAILY BEFORE BREAKFAST (Patient taking differently: Take 40 mg by mouth daily.) 30 capsule 10   ondansetron (ZOFRAN ODT) 4 MG disintegrating tablet Dissolve1 tablet (4 mg total) by mouth 2 (two) times daily as needed for nausea or vomiting. (Patient not taking: Reported on 05/18/2021) 6 tablet 0   SUMAtriptan (IMITREX) 50 MG tablet 1 tablet at onset of headache followed by 1 tablet 2 hours later if symptoms persist     SUMAtriptan (IMITREX) 50 MG tablet Take 1 tablet by mouth at onset of headache followed by 1 tablet 2 hours later if symptoms persist 9 tablet 0   triamcinolone ointment (KENALOG) 0.1 % APPLY A THIN LAYER  TO THE AFFECTED AREA(S) OF SKIN 2 TIMES PER DAY 30 g 0   zolpidem (AMBIEN) 5 MG tablet Take 1 tablet by mouth at bedtime as directed 30 tablet 0   zonisamide (ZONEGRAN) 25 MG capsule Take 4 capsules (100 mg total) by mouth every evening. (Patient not taking: Reported on 05/18/2021) 120 capsule 1   No current facility-administered medications for this visit.     REVIEW OF SYSTEMS: On review of systems, the patient reports that she is doing well since surgery. She denies any fever, redness, or drainage from her scar line or swelling. No other complaints are verbalized.      PHYSICAL EXAM:  Wt Readings from Last 3 Encounters:  06/07/21 210 lb 3 oz (95.3 kg)  05/26/21 206 lb 12.7 oz (93.8 kg)  05/18/21 210 lb 3.2 oz (95.3 kg)   Temp Readings from Last 3 Encounters:  06/07/21 97.6 F (36.4 C) (Temporal)  05/26/21 (!) 97.5 F (36.4 C)  05/18/21 98.6 F (37 C)   BP Readings from Last 3 Encounters:  06/07/21 (!) 137/98  05/26/21 (!) 124/92  05/18/21 (!) 138/96   Pulse Readings from Last 3  Encounters:  06/07/21 80  05/26/21 74  05/18/21 81    In general this is a well appearing African American female in no acute distress. She's alert and oriented x4 and appropriate throughout the examination. Cardiopulmonary assessment is negative for acute distress and she exhibits normal effort. Her right breast has a well-healed surgical incision without erythema separation or drainage.    ECOG = 0  0 - Asymptomatic (Fully active, able to carry on all predisease activities without restriction)  1 - Symptomatic but completely ambulatory (Restricted in physically strenuous activity but ambulatory and able to carry out work of a light or sedentary nature. For example, light housework, office work)  2 - Symptomatic, <50% in bed during the day (Ambulatory and capable of all self care but unable to carry out any work activities. Up and about more than 50% of waking hours)  3 - Symptomatic, >50% in bed, but not bedbound (Capable of only limited self-care, confined to bed or chair 50% or more of waking hours)  4 - Bedbound (Completely disabled. Cannot carry on any self-care. Totally confined to bed or chair)  5 - Death   Eustace Pen MM, Creech RH, Tormey DC, et al. [phone]). "Toxicity and response criteria of the [facility]". [place] Oncol. 5 (6): 649-55    LABORATORY DATA:  Lab Results  Component Value Date   WBC 6.3 04/29/2021   HGB 14.4 04/29/2021   HCT 46.1 (H) 04/29/2021   MCV 80.0 04/29/2021   PLT 253 04/29/2021   Lab Results  Component Value Date   NA 139 04/29/2021   K 3.5 04/29/2021   CL 105 04/29/2021   CO2 25 04/29/2021   Lab Results  Component Value Date   ALT 25 04/29/2021   AST 21 04/29/2021   ALKPHOS 59 04/29/2021   BILITOT 0.5 04/29/2021      RADIOGRAPHY: No results found.     IMPRESSION/PLAN: 1. Intermediate grade, ER/PR positive DCIS of the right breast. Dr. Lisbeth Renshaw discusses the final pathology findings and reviews the nature of  right breast disease.  She has done well since surgery and is ready to proceed with external radiotherapy to the breast  to reduce risks of local recurrence followed by antiestrogen therapy. We discussed the risks, benefits, short, and long term effects of radiotherapy, as  well as the curative intent, and the patient is interested in proceeding. Dr. Lisbeth Renshaw discusses the delivery and logistics of radiotherapy and anticipates a course of 6 1/2 weeks of radiotherapy to the right breast. Written consent is obtained and placed in the chart, a copy was provided to the patient.  She will simulate this morning.  In a visit lasting 45 minutes, greater than 50% of the time was spent face to face reviewing her case, as well as in preparation of, discussing, and coordinating the patient's care.  The above documentation reflects my direct findings during this shared patient visit. Please see the separate note by Dr. Lisbeth Renshaw on this date for the remainder of the patient's plan of care.    Carola Rhine, [facility]    **Disclaimer: This note was dictated with voice recognition software. Similar sounding words can inadvertently be transcribed and this note may contain transcription errors which may not have been corrected upon publication of note.**

## 2021-06-28 ENCOUNTER — Other Ambulatory Visit: Payer: Self-pay

## 2021-06-28 ENCOUNTER — Encounter: Payer: Self-pay | Admitting: Radiation Oncology

## 2021-06-28 ENCOUNTER — Ambulatory Visit
Admission: RE | Admit: 2021-06-28 | Discharge: 2021-06-28 | Disposition: A | Discharge status: Home / Self Care | Payer: 59 | Source: Ambulatory Visit | Attending: Radiation Oncology | Admitting: Radiation Oncology

## 2021-06-28 ENCOUNTER — Other Ambulatory Visit (HOSPITAL_COMMUNITY): Payer: Self-pay

## 2021-06-28 VITALS — BP 126/88 | HR 65 | Temp 97.7°F | Resp 20 | Ht 69.0 in | Wt 211.6 lb

## 2021-06-28 DIAGNOSIS — K219 Gastro-esophageal reflux disease without esophagitis: Secondary | ICD-10-CM | POA: Diagnosis not present

## 2021-06-28 DIAGNOSIS — Z170 Estrogen receptor positive status [ER+]: Secondary | ICD-10-CM | POA: Diagnosis not present

## 2021-06-28 DIAGNOSIS — Z79899 Other long term (current) drug therapy: Secondary | ICD-10-CM | POA: Diagnosis not present

## 2021-06-28 DIAGNOSIS — E210 Primary hyperparathyroidism: Secondary | ICD-10-CM | POA: Insufficient documentation

## 2021-06-28 DIAGNOSIS — D0511 Intraductal carcinoma in situ of right breast: Secondary | ICD-10-CM | POA: Diagnosis not present

## 2021-06-28 DIAGNOSIS — Z803 Family history of malignant neoplasm of breast: Secondary | ICD-10-CM | POA: Insufficient documentation

## 2021-06-28 DIAGNOSIS — Z8616 Personal history of COVID-19: Secondary | ICD-10-CM | POA: Insufficient documentation

## 2021-06-28 DIAGNOSIS — Z510 Encounter for antineoplastic radiation therapy: Secondary | ICD-10-CM | POA: Insufficient documentation

## 2021-06-28 DIAGNOSIS — F909 Attention-deficit hyperactivity disorder, unspecified type: Secondary | ICD-10-CM | POA: Insufficient documentation

## 2021-06-28 DIAGNOSIS — Z87442 Personal history of urinary calculi: Secondary | ICD-10-CM | POA: Diagnosis not present

## 2021-06-28 DIAGNOSIS — Z800 Family history of malignant neoplasm of digestive organs: Secondary | ICD-10-CM | POA: Insufficient documentation

## 2021-06-28 DIAGNOSIS — I2510 Atherosclerotic heart disease of native coronary artery without angina pectoris: Secondary | ICD-10-CM | POA: Insufficient documentation

## 2021-06-28 NOTE — Progress Notes (Signed)
Verified patient's identity and begin nursing interview. Patient states "Doing well." No symptoms reported at this time.  Meaningful use complete. Patient states "Tubal Ligation, NO chances of pregnancy."  BP 126/88 (BP Location: Right Arm, Patient Position: Sitting, Cuff Size: Large)    Pulse 65    Temp 97.7 F (36.5 C) (Temporal)    Resp 20    Ht 5\' 9"  (1.753 m)    Wt 211 lb 9.6 oz (96 kg)    SpO2 98%    BMI 31.25 kg/m

## 2021-07-03 DIAGNOSIS — Z510 Encounter for antineoplastic radiation therapy: Secondary | ICD-10-CM | POA: Diagnosis not present

## 2021-07-03 DIAGNOSIS — Z170 Estrogen receptor positive status [ER+]: Secondary | ICD-10-CM | POA: Diagnosis not present

## 2021-07-03 DIAGNOSIS — D0511 Intraductal carcinoma in situ of right breast: Secondary | ICD-10-CM | POA: Diagnosis not present

## 2021-07-05 ENCOUNTER — Other Ambulatory Visit: Payer: Self-pay

## 2021-07-05 ENCOUNTER — Other Ambulatory Visit (HOSPITAL_COMMUNITY): Payer: Self-pay

## 2021-07-05 ENCOUNTER — Ambulatory Visit
Admission: RE | Admit: 2021-07-05 | Discharge: 2021-07-05 | Disposition: A | Discharge status: Home / Self Care | Payer: 59 | Source: Ambulatory Visit | Attending: Radiation Oncology | Admitting: Radiation Oncology

## 2021-07-05 ENCOUNTER — Telehealth: Payer: Self-pay | Admitting: Hematology and Oncology

## 2021-07-05 ENCOUNTER — Encounter: Payer: Self-pay | Admitting: *Deleted

## 2021-07-05 DIAGNOSIS — D0511 Intraductal carcinoma in situ of right breast: Secondary | ICD-10-CM | POA: Diagnosis not present

## 2021-07-05 DIAGNOSIS — Z170 Estrogen receptor positive status [ER+]: Secondary | ICD-10-CM | POA: Diagnosis not present

## 2021-07-05 DIAGNOSIS — Z510 Encounter for antineoplastic radiation therapy: Secondary | ICD-10-CM | POA: Diagnosis not present

## 2021-07-05 NOTE — Telephone Encounter (Signed)
Scheduled appointment per 1/17 scheduling message. Left message.

## 2021-07-06 ENCOUNTER — Ambulatory Visit
Admission: RE | Admit: 2021-07-06 | Discharge: 2021-07-06 | Disposition: A | Discharge status: Home / Self Care | Payer: 59 | Source: Ambulatory Visit | Attending: Radiation Oncology | Admitting: Radiation Oncology

## 2021-07-06 DIAGNOSIS — Z170 Estrogen receptor positive status [ER+]: Secondary | ICD-10-CM | POA: Diagnosis not present

## 2021-07-06 DIAGNOSIS — D0511 Intraductal carcinoma in situ of right breast: Secondary | ICD-10-CM | POA: Diagnosis not present

## 2021-07-06 DIAGNOSIS — Z510 Encounter for antineoplastic radiation therapy: Secondary | ICD-10-CM | POA: Diagnosis not present

## 2021-07-07 ENCOUNTER — Ambulatory Visit
Admission: RE | Admit: 2021-07-07 | Discharge: 2021-07-07 | Disposition: A | Discharge status: Home / Self Care | Payer: 59 | Source: Ambulatory Visit | Attending: Radiation Oncology | Admitting: Radiation Oncology

## 2021-07-07 ENCOUNTER — Other Ambulatory Visit (HOSPITAL_COMMUNITY): Payer: Self-pay

## 2021-07-07 DIAGNOSIS — Z510 Encounter for antineoplastic radiation therapy: Secondary | ICD-10-CM | POA: Diagnosis not present

## 2021-07-07 DIAGNOSIS — D0511 Intraductal carcinoma in situ of right breast: Secondary | ICD-10-CM | POA: Diagnosis not present

## 2021-07-07 DIAGNOSIS — Z170 Estrogen receptor positive status [ER+]: Secondary | ICD-10-CM | POA: Diagnosis not present

## 2021-07-07 NOTE — Progress Notes (Signed)
Pt here for patient teaching.  Pt given Radiation and You booklet, skin care instructions, Alra deodorant, and Radiaplex gel.  Reviewed areas of pertinence such as fatigue, hair loss, skin changes, breast tenderness, and breast swelling . Pt able to give teach back of to pat skin and use unscented/gentle soap,apply Radiaplex bid, avoid applying anything to skin within 4 hours of treatment, avoid wearing an under wire bra, and to use an electric razor if they must shave. Pt verbalizes understanding of information given and will contact nursing with any questions or concerns.    Emarie Paul M. Stefana Lodico RN, BSN      

## 2021-07-08 ENCOUNTER — Ambulatory Visit
Admission: RE | Admit: 2021-07-08 | Discharge: 2021-07-08 | Disposition: A | Discharge status: Home / Self Care | Payer: 59 | Source: Ambulatory Visit | Attending: Radiation Oncology | Admitting: Radiation Oncology

## 2021-07-08 ENCOUNTER — Other Ambulatory Visit: Payer: Self-pay

## 2021-07-08 ENCOUNTER — Other Ambulatory Visit (HOSPITAL_COMMUNITY): Payer: Self-pay

## 2021-07-08 DIAGNOSIS — Z170 Estrogen receptor positive status [ER+]: Secondary | ICD-10-CM | POA: Diagnosis not present

## 2021-07-08 DIAGNOSIS — D0511 Intraductal carcinoma in situ of right breast: Secondary | ICD-10-CM | POA: Diagnosis not present

## 2021-07-08 DIAGNOSIS — Z510 Encounter for antineoplastic radiation therapy: Secondary | ICD-10-CM | POA: Diagnosis not present

## 2021-07-08 MED ORDER — RADIAPLEXRX EX GEL: Freq: Once | CUTANEOUS | Status: AC

## 2021-07-08 MED ORDER — ALRA NON-METALLIC DEODORANT (RAD-ONC)
1.0000 "application " | Freq: Once | TOPICAL | Status: AC
  Administered 2021-07-08: 1 via TOPICAL

## 2021-07-11 ENCOUNTER — Ambulatory Visit
Admission: RE | Admit: 2021-07-11 | Discharge: 2021-07-11 | Disposition: A | Discharge status: Home / Self Care | Payer: 59 | Source: Ambulatory Visit | Attending: Radiation Oncology | Admitting: Radiation Oncology

## 2021-07-11 ENCOUNTER — Other Ambulatory Visit: Payer: Self-pay

## 2021-07-11 ENCOUNTER — Other Ambulatory Visit (HOSPITAL_COMMUNITY): Payer: Self-pay

## 2021-07-11 DIAGNOSIS — N3000 Acute cystitis without hematuria: Secondary | ICD-10-CM | POA: Diagnosis not present

## 2021-07-11 DIAGNOSIS — Z170 Estrogen receptor positive status [ER+]: Secondary | ICD-10-CM | POA: Diagnosis not present

## 2021-07-11 DIAGNOSIS — D0511 Intraductal carcinoma in situ of right breast: Secondary | ICD-10-CM | POA: Diagnosis not present

## 2021-07-11 DIAGNOSIS — R350 Frequency of micturition: Secondary | ICD-10-CM | POA: Diagnosis not present

## 2021-07-11 DIAGNOSIS — Z510 Encounter for antineoplastic radiation therapy: Secondary | ICD-10-CM | POA: Diagnosis not present

## 2021-07-11 MED ORDER — NITROFURANTOIN MONOHYD MACRO 100 MG PO CAPS
ORAL_CAPSULE | ORAL | 0 refills | Status: DC
  Filled 2021-07-11: qty 14, 7d supply, fill #0

## 2021-07-12 ENCOUNTER — Ambulatory Visit
Admission: RE | Admit: 2021-07-12 | Discharge: 2021-07-12 | Disposition: A | Discharge status: Home / Self Care | Payer: 59 | Source: Ambulatory Visit | Attending: Radiation Oncology | Admitting: Radiation Oncology

## 2021-07-12 DIAGNOSIS — Z510 Encounter for antineoplastic radiation therapy: Secondary | ICD-10-CM | POA: Diagnosis not present

## 2021-07-12 DIAGNOSIS — D0511 Intraductal carcinoma in situ of right breast: Secondary | ICD-10-CM | POA: Diagnosis not present

## 2021-07-12 DIAGNOSIS — Z170 Estrogen receptor positive status [ER+]: Secondary | ICD-10-CM | POA: Diagnosis not present

## 2021-07-13 ENCOUNTER — Ambulatory Visit
Admission: RE | Admit: 2021-07-13 | Discharge: 2021-07-13 | Disposition: A | Discharge status: Home / Self Care | Payer: 59 | Source: Ambulatory Visit | Attending: Radiation Oncology | Admitting: Radiation Oncology

## 2021-07-13 ENCOUNTER — Other Ambulatory Visit (HOSPITAL_COMMUNITY): Payer: Self-pay

## 2021-07-13 ENCOUNTER — Other Ambulatory Visit: Payer: Self-pay

## 2021-07-13 DIAGNOSIS — D0511 Intraductal carcinoma in situ of right breast: Secondary | ICD-10-CM | POA: Diagnosis not present

## 2021-07-13 DIAGNOSIS — Z170 Estrogen receptor positive status [ER+]: Secondary | ICD-10-CM | POA: Diagnosis not present

## 2021-07-13 DIAGNOSIS — Z510 Encounter for antineoplastic radiation therapy: Secondary | ICD-10-CM | POA: Diagnosis not present

## 2021-07-13 MED ORDER — FLUCONAZOLE 150 MG PO TABS
ORAL_TABLET | ORAL | 0 refills | Status: DC
  Filled 2021-07-13: qty 2, 4d supply, fill #0

## 2021-07-14 ENCOUNTER — Ambulatory Visit: Payer: 59

## 2021-07-15 ENCOUNTER — Ambulatory Visit
Admission: RE | Admit: 2021-07-15 | Discharge: 2021-07-15 | Disposition: A | Discharge status: Home / Self Care | Payer: 59 | Source: Ambulatory Visit | Attending: Radiation Oncology | Admitting: Radiation Oncology

## 2021-07-15 DIAGNOSIS — Z510 Encounter for antineoplastic radiation therapy: Secondary | ICD-10-CM | POA: Diagnosis not present

## 2021-07-15 DIAGNOSIS — D0511 Intraductal carcinoma in situ of right breast: Secondary | ICD-10-CM | POA: Diagnosis not present

## 2021-07-15 DIAGNOSIS — Z170 Estrogen receptor positive status [ER+]: Secondary | ICD-10-CM | POA: Diagnosis not present

## 2021-07-18 ENCOUNTER — Other Ambulatory Visit: Payer: Self-pay

## 2021-07-18 ENCOUNTER — Ambulatory Visit
Admission: RE | Admit: 2021-07-18 | Discharge: 2021-07-18 | Disposition: A | Discharge status: Home / Self Care | Payer: 59 | Source: Ambulatory Visit | Attending: Radiation Oncology | Admitting: Radiation Oncology

## 2021-07-18 DIAGNOSIS — D0511 Intraductal carcinoma in situ of right breast: Secondary | ICD-10-CM | POA: Diagnosis not present

## 2021-07-18 DIAGNOSIS — Z510 Encounter for antineoplastic radiation therapy: Secondary | ICD-10-CM | POA: Diagnosis not present

## 2021-07-18 DIAGNOSIS — Z170 Estrogen receptor positive status [ER+]: Secondary | ICD-10-CM | POA: Diagnosis not present

## 2021-07-19 ENCOUNTER — Ambulatory Visit
Admission: RE | Admit: 2021-07-19 | Discharge: 2021-07-19 | Disposition: A | Discharge status: Home / Self Care | Payer: 59 | Source: Ambulatory Visit | Attending: Radiation Oncology | Admitting: Radiation Oncology

## 2021-07-19 DIAGNOSIS — Z510 Encounter for antineoplastic radiation therapy: Secondary | ICD-10-CM | POA: Diagnosis not present

## 2021-07-19 DIAGNOSIS — Z170 Estrogen receptor positive status [ER+]: Secondary | ICD-10-CM | POA: Diagnosis not present

## 2021-07-19 DIAGNOSIS — D0511 Intraductal carcinoma in situ of right breast: Secondary | ICD-10-CM | POA: Diagnosis not present

## 2021-07-20 ENCOUNTER — Ambulatory Visit
Admission: RE | Admit: 2021-07-20 | Discharge: 2021-07-20 | Disposition: A | Discharge status: Home / Self Care | Payer: 59 | Source: Ambulatory Visit | Attending: Radiation Oncology | Admitting: Radiation Oncology

## 2021-07-20 ENCOUNTER — Other Ambulatory Visit: Payer: Self-pay

## 2021-07-20 DIAGNOSIS — Z170 Estrogen receptor positive status [ER+]: Secondary | ICD-10-CM | POA: Insufficient documentation

## 2021-07-20 DIAGNOSIS — D0511 Intraductal carcinoma in situ of right breast: Secondary | ICD-10-CM | POA: Insufficient documentation

## 2021-07-20 DIAGNOSIS — Z510 Encounter for antineoplastic radiation therapy: Secondary | ICD-10-CM | POA: Insufficient documentation

## 2021-07-21 ENCOUNTER — Ambulatory Visit
Admission: RE | Admit: 2021-07-21 | Discharge: 2021-07-21 | Disposition: A | Discharge status: Home / Self Care | Payer: 59 | Source: Ambulatory Visit | Attending: Radiation Oncology | Admitting: Radiation Oncology

## 2021-07-21 DIAGNOSIS — Z170 Estrogen receptor positive status [ER+]: Secondary | ICD-10-CM | POA: Diagnosis not present

## 2021-07-21 DIAGNOSIS — Z510 Encounter for antineoplastic radiation therapy: Secondary | ICD-10-CM | POA: Diagnosis not present

## 2021-07-21 DIAGNOSIS — D0511 Intraductal carcinoma in situ of right breast: Secondary | ICD-10-CM | POA: Diagnosis not present

## 2021-07-22 ENCOUNTER — Other Ambulatory Visit: Payer: Self-pay

## 2021-07-22 ENCOUNTER — Ambulatory Visit
Admission: RE | Admit: 2021-07-22 | Discharge: 2021-07-22 | Disposition: A | Discharge status: Home / Self Care | Payer: 59 | Source: Ambulatory Visit | Attending: Radiation Oncology | Admitting: Radiation Oncology

## 2021-07-22 DIAGNOSIS — Z510 Encounter for antineoplastic radiation therapy: Secondary | ICD-10-CM | POA: Diagnosis not present

## 2021-07-22 DIAGNOSIS — Z170 Estrogen receptor positive status [ER+]: Secondary | ICD-10-CM | POA: Diagnosis not present

## 2021-07-22 DIAGNOSIS — D0511 Intraductal carcinoma in situ of right breast: Secondary | ICD-10-CM | POA: Diagnosis not present

## 2021-07-25 ENCOUNTER — Emergency Department (HOSPITAL_BASED_OUTPATIENT_CLINIC_OR_DEPARTMENT_OTHER): Payer: 59

## 2021-07-25 ENCOUNTER — Other Ambulatory Visit: Payer: Self-pay

## 2021-07-25 ENCOUNTER — Encounter (HOSPITAL_BASED_OUTPATIENT_CLINIC_OR_DEPARTMENT_OTHER): Payer: Self-pay | Admitting: Emergency Medicine

## 2021-07-25 ENCOUNTER — Ambulatory Visit
Admission: RE | Admit: 2021-07-25 | Discharge: 2021-07-25 | Disposition: A | Discharge status: Home / Self Care | Payer: 59 | Source: Ambulatory Visit | Attending: Radiation Oncology | Admitting: Radiation Oncology

## 2021-07-25 ENCOUNTER — Emergency Department (HOSPITAL_BASED_OUTPATIENT_CLINIC_OR_DEPARTMENT_OTHER)
Admission: EM | Admit: 2021-07-25 | Discharge: 2021-07-25 | Disposition: A | Discharge status: Home / Self Care | Payer: 59 | Source: Home / Self Care | Attending: Emergency Medicine | Admitting: Emergency Medicine

## 2021-07-25 DIAGNOSIS — Z8616 Personal history of COVID-19: Secondary | ICD-10-CM | POA: Diagnosis not present

## 2021-07-25 DIAGNOSIS — Z6831 Body mass index (BMI) 31.0-31.9, adult: Secondary | ICD-10-CM | POA: Diagnosis not present

## 2021-07-25 DIAGNOSIS — D259 Leiomyoma of uterus, unspecified: Secondary | ICD-10-CM | POA: Diagnosis present

## 2021-07-25 DIAGNOSIS — N390 Urinary tract infection, site not specified: Secondary | ICD-10-CM | POA: Diagnosis not present

## 2021-07-25 DIAGNOSIS — R1031 Right lower quadrant pain: Secondary | ICD-10-CM | POA: Diagnosis not present

## 2021-07-25 DIAGNOSIS — K219 Gastro-esophageal reflux disease without esophagitis: Secondary | ICD-10-CM | POA: Diagnosis present

## 2021-07-25 DIAGNOSIS — N2889 Other specified disorders of kidney and ureter: Secondary | ICD-10-CM | POA: Insufficient documentation

## 2021-07-25 DIAGNOSIS — N201 Calculus of ureter: Secondary | ICD-10-CM | POA: Insufficient documentation

## 2021-07-25 DIAGNOSIS — Z885 Allergy status to narcotic agent status: Secondary | ICD-10-CM | POA: Diagnosis not present

## 2021-07-25 DIAGNOSIS — N136 Pyonephrosis: Secondary | ICD-10-CM | POA: Diagnosis not present

## 2021-07-25 DIAGNOSIS — F411 Generalized anxiety disorder: Secondary | ICD-10-CM | POA: Diagnosis present

## 2021-07-25 DIAGNOSIS — D72829 Elevated white blood cell count, unspecified: Secondary | ICD-10-CM | POA: Diagnosis not present

## 2021-07-25 DIAGNOSIS — Z79899 Other long term (current) drug therapy: Secondary | ICD-10-CM | POA: Diagnosis not present

## 2021-07-25 DIAGNOSIS — Z881 Allergy status to other antibiotic agents status: Secondary | ICD-10-CM | POA: Diagnosis not present

## 2021-07-25 DIAGNOSIS — D0511 Intraductal carcinoma in situ of right breast: Secondary | ICD-10-CM | POA: Diagnosis not present

## 2021-07-25 DIAGNOSIS — E669 Obesity, unspecified: Secondary | ICD-10-CM | POA: Diagnosis not present

## 2021-07-25 DIAGNOSIS — N202 Calculus of kidney with calculus of ureter: Secondary | ICD-10-CM | POA: Diagnosis not present

## 2021-07-25 DIAGNOSIS — Z853 Personal history of malignant neoplasm of breast: Secondary | ICD-10-CM | POA: Insufficient documentation

## 2021-07-25 DIAGNOSIS — Z91040 Latex allergy status: Secondary | ICD-10-CM | POA: Insufficient documentation

## 2021-07-25 DIAGNOSIS — N1330 Unspecified hydronephrosis: Secondary | ICD-10-CM | POA: Diagnosis not present

## 2021-07-25 DIAGNOSIS — N10 Acute tubulo-interstitial nephritis: Secondary | ICD-10-CM | POA: Diagnosis not present

## 2021-07-25 DIAGNOSIS — E876 Hypokalemia: Secondary | ICD-10-CM | POA: Diagnosis present

## 2021-07-25 DIAGNOSIS — N3000 Acute cystitis without hematuria: Secondary | ICD-10-CM | POA: Diagnosis not present

## 2021-07-25 DIAGNOSIS — F419 Anxiety disorder, unspecified: Secondary | ICD-10-CM | POA: Diagnosis not present

## 2021-07-25 DIAGNOSIS — Z87442 Personal history of urinary calculi: Secondary | ICD-10-CM | POA: Diagnosis not present

## 2021-07-25 DIAGNOSIS — G43909 Migraine, unspecified, not intractable, without status migrainosus: Secondary | ICD-10-CM | POA: Diagnosis not present

## 2021-07-25 DIAGNOSIS — A419 Sepsis, unspecified organism: Secondary | ICD-10-CM | POA: Diagnosis not present

## 2021-07-25 DIAGNOSIS — F909 Attention-deficit hyperactivity disorder, unspecified type: Secondary | ICD-10-CM | POA: Diagnosis present

## 2021-07-25 DIAGNOSIS — N179 Acute kidney failure, unspecified: Secondary | ICD-10-CM | POA: Diagnosis not present

## 2021-07-25 DIAGNOSIS — Z20822 Contact with and (suspected) exposure to covid-19: Secondary | ICD-10-CM | POA: Diagnosis not present

## 2021-07-25 DIAGNOSIS — Z170 Estrogen receptor positive status [ER+]: Secondary | ICD-10-CM | POA: Diagnosis not present

## 2021-07-25 DIAGNOSIS — R000 Tachycardia, unspecified: Secondary | ICD-10-CM | POA: Diagnosis not present

## 2021-07-25 DIAGNOSIS — N289 Disorder of kidney and ureter, unspecified: Secondary | ICD-10-CM | POA: Diagnosis not present

## 2021-07-25 DIAGNOSIS — E210 Primary hyperparathyroidism: Secondary | ICD-10-CM | POA: Diagnosis present

## 2021-07-25 DIAGNOSIS — Z803 Family history of malignant neoplasm of breast: Secondary | ICD-10-CM | POA: Diagnosis not present

## 2021-07-25 LAB — COMPREHENSIVE METABOLIC PANEL
ALT: 23 U/L (ref 0–44)
AST: 21 U/L (ref 15–41)
Albumin: 3.8 g/dL (ref 3.5–5.0)
Alkaline Phosphatase: 59 U/L (ref 38–126)
Anion gap: 6 (ref 5–15)
BUN: 15 mg/dL (ref 6–20)
CO2: 25 mmol/L (ref 22–32)
Calcium: 9.9 mg/dL (ref 8.9–10.3)
Chloride: 105 mmol/L (ref 98–111)
Creatinine, Ser: 0.99 mg/dL (ref 0.44–1.00)
GFR, Estimated: >60 mL/min (ref >60)
Glucose, Bld: 103 mg/dL — ABNORMAL HIGH (ref 70–99)
Potassium: 3.6 mmol/L (ref 3.5–5.1)
Sodium: 136 mmol/L (ref 135–145)
Total Bilirubin: 0.7 mg/dL (ref 0.3–1.2)
Total Protein: 6.8 g/dL (ref 6.5–8.1)

## 2021-07-25 LAB — CBC WITH DIFFERENTIAL/PLATELET
Abs Immature Granulocytes: 0 10*3/uL (ref 0.00–0.07)
Basophils Absolute: 0 10*3/uL (ref 0.0–0.1)
Basophils Relative: 0 %
Eosinophils Absolute: 0.1 10*3/uL (ref 0.0–0.5)
Eosinophils Relative: 1 %
HCT: 40.5 % (ref 36.0–46.0)
Hemoglobin: 12.8 g/dL (ref 12.0–15.0)
Immature Granulocytes: 0 %
Lymphocytes Relative: 11 %
Lymphs Abs: 0.7 10*3/uL (ref 0.7–4.0)
MCH: 25.4 pg — ABNORMAL LOW (ref 26.0–34.0)
MCHC: 31.6 g/dL (ref 30.0–36.0)
MCV: 80.5 fL (ref 80.0–100.0)
Monocytes Absolute: 0.4 10*3/uL (ref 0.1–1.0)
Monocytes Relative: 7 %
Neutro Abs: 5 10*3/uL (ref 1.7–7.7)
Neutrophils Relative %: 81 %
Platelets: 287 10*3/uL (ref 150–400)
RBC: 5.03 MIL/uL (ref 3.87–5.11)
RDW: 15.7 % — ABNORMAL HIGH (ref 11.5–15.5)
WBC: 6.1 10*3/uL (ref 4.0–10.5)
nRBC: 0 % (ref 0.0–0.2)

## 2021-07-25 LAB — URINALYSIS, ROUTINE W REFLEX MICROSCOPIC
Bilirubin Urine: NEGATIVE
Glucose, UA: NEGATIVE mg/dL
Ketones, ur: NEGATIVE mg/dL
Nitrite: NEGATIVE
Protein, ur: 30 mg/dL — AB
Specific Gravity, Urine: 1.015 (ref 1.005–1.030)
pH: 6 (ref 5.0–8.0)

## 2021-07-25 LAB — URINALYSIS, MICROSCOPIC (REFLEX): WBC, UA: >50 WBC/hpf (ref 0–5)

## 2021-07-25 LAB — PREGNANCY, URINE: Preg Test, Ur: NEGATIVE

## 2021-07-25 MED ORDER — MORPHINE SULFATE (PF) 2 MG/ML IV SOLN
2.0000 mg | Freq: Once | INTRAVENOUS | Status: AC
  Administered 2021-07-25: 2 mg via INTRAVENOUS
  Filled 2021-07-25: qty 1

## 2021-07-25 MED ORDER — FENTANYL CITRATE PF 50 MCG/ML IJ SOSY
100.0000 ug | PREFILLED_SYRINGE | Freq: Once | INTRAMUSCULAR | Status: AC
  Administered 2021-07-25: 100 ug via INTRAVENOUS
  Filled 2021-07-25: qty 2

## 2021-07-25 MED ORDER — TAMSULOSIN HCL 0.4 MG PO CAPS
0.4000 mg | ORAL_CAPSULE | Freq: Every day | ORAL | 0 refills | Status: DC
  Filled 2021-07-25: qty 15, 15d supply, fill #0

## 2021-07-25 MED ORDER — IOHEXOL 300 MG/ML  SOLN
100.0000 mL | Freq: Once | INTRAMUSCULAR | Status: AC | PRN
  Administered 2021-07-25: 100 mL via INTRAVENOUS

## 2021-07-25 MED ORDER — FLUCONAZOLE 150 MG PO TABS
ORAL_TABLET | ORAL | 0 refills | Status: DC
  Filled 2021-07-25: qty 2, 2d supply, fill #0

## 2021-07-25 MED ORDER — MORPHINE SULFATE (PF) 4 MG/ML IV SOLN
4.0000 mg | Freq: Once | INTRAVENOUS | Status: AC
  Administered 2021-07-25: 4 mg via INTRAVENOUS
  Filled 2021-07-25: qty 1

## 2021-07-25 MED ORDER — FENTANYL CITRATE PF 50 MCG/ML IJ SOSY
100.0000 ug | PREFILLED_SYRINGE | Freq: Once | INTRAMUSCULAR | Status: AC
Start: 2021-07-25 — End: 2021-07-25
  Administered 2021-07-25: 100 ug via INTRAVENOUS
  Filled 2021-07-25: qty 2

## 2021-07-25 MED ORDER — KETOROLAC TROMETHAMINE 30 MG/ML IJ SOLN
30.0000 mg | Freq: Once | INTRAMUSCULAR | Status: AC
  Administered 2021-07-25: 30 mg via INTRAVENOUS
  Filled 2021-07-25: qty 1

## 2021-07-25 MED ORDER — CEPHALEXIN 500 MG PO CAPS
500.0000 mg | ORAL_CAPSULE | Freq: Four times a day (QID) | ORAL | 0 refills | Status: DC
Start: 2021-07-25 — End: 2021-07-29
  Filled 2021-07-25: qty 40, 10d supply, fill #0

## 2021-07-25 MED ORDER — ONDANSETRON HCL 4 MG PO TABS
4.0000 mg | ORAL_TABLET | Freq: Every day | ORAL | 1 refills | Status: DC | PRN
  Filled 2021-07-25: qty 30, 30d supply, fill #0

## 2021-07-25 MED ORDER — OXYCODONE-ACETAMINOPHEN 5-325 MG PO TABS
2.0000 | ORAL_TABLET | Freq: Once | ORAL | Status: AC
  Administered 2021-07-25: 2 via ORAL
  Filled 2021-07-25: qty 2

## 2021-07-25 MED ORDER — CEPHALEXIN 250 MG PO CAPS
250.0000 mg | ORAL_CAPSULE | Freq: Once | ORAL | Status: AC
  Administered 2021-07-25: 250 mg via ORAL
  Filled 2021-07-25: qty 1

## 2021-07-25 MED ORDER — ONDANSETRON 4 MG PO TBDP
4.0000 mg | ORAL_TABLET | Freq: Once | ORAL | Status: AC
  Administered 2021-07-25: 4 mg via ORAL
  Filled 2021-07-25: qty 1

## 2021-07-25 MED ORDER — ONDANSETRON HCL 4 MG/2ML IJ SOLN
4.0000 mg | Freq: Once | INTRAMUSCULAR | Status: AC
  Administered 2021-07-25: 4 mg via INTRAVENOUS
  Filled 2021-07-25: qty 2

## 2021-07-25 MED ORDER — OXYCODONE-ACETAMINOPHEN 5-325 MG PO TABS
1.0000 | ORAL_TABLET | ORAL | 0 refills | Status: DC | PRN
  Filled 2021-07-25: qty 30, 5d supply, fill #0

## 2021-07-25 NOTE — ED Notes (Signed)
Pt ambulatory with steady gait to restroom to provide urine specimen 

## 2021-07-25 NOTE — ED Notes (Signed)
Spoke with lab to run bloodwork and add on urine preg

## 2021-07-25 NOTE — Discharge Instructions (Addendum)
Schedule to see the Urologist for evaluation.   Call me tomorrow if you have questions.  Alyse Low  [phone].   If you have fever, vomiting, increased pain or pain not relieved by medication>  go to [facility]

## 2021-07-25 NOTE — ED Notes (Signed)
Pt aware that contrast needs to be drank within 30 minutes

## 2021-07-25 NOTE — ED Notes (Signed)
Pt reports pain meds not reducing pain. EDP notified

## 2021-07-25 NOTE — ED Notes (Signed)
Pt ambulated with steady gait to restroom ?

## 2021-07-25 NOTE — ED Notes (Signed)
First contact with patient. Pt arrived via triage from home with c/o RLQ pain starting last night Pt. denies shob, is A&OX 4, resp. even/unlabored. Pt placed into gown, call light within reach. Patient updated on plan of care. Will continue to monitor patient.  Pt endorses current radiation treatment for breast cancer

## 2021-07-25 NOTE — ED Notes (Signed)
Md provider made aware of elevated BP and increased pain

## 2021-07-25 NOTE — ED Triage Notes (Signed)
Pt arrives pov with c/o RLQ pain radiating to right flank starting last night. Was recently treated for UTI, abx finished over a week pta. Pt denies fever, constipation or dysuria, endorses nausea. Endorses motrin 400 mg and gas-x at 0730 today.

## 2021-07-25 NOTE — ED Provider Notes (Signed)
[facility] Provider Note   CSN: [id number] Arrival date & time: 07/25/21  0831     History  Chief Complaint  Patient presents with   Abdominal Pain    Misty [name] is a 46 y.o. female.  Pt has a history of breast cancer   The history is provided by the patient. No language interpreter was used.  Abdominal Pain Pain location:  RLQ Pain quality: aching and stabbing   Pain radiates to:  Back Pain severity:  Moderate Onset quality:  Sudden Duration:  1 day Timing:  Constant Progression:  Worsening Chronicity:  New Relieved by:  Nothing Worsened by:  Nothing Ineffective treatments:  None tried Associated symptoms: nausea   Associated symptoms: no vomiting   Risk factors: not pregnant       Home Medications Prior to Admission medications   Medication Sig Start Date End Date Taking? Authorizing Provider  cephALEXin (KEFLEX) 500 MG capsule Take 1 capsule (500 mg total) by mouth 4 (four) times daily for 10 days. 07/25/21 08/04/21 Yes Caryl Ada K, PA-C  fluconazole (DIFLUCAN) 150 MG tablet One tablet as needed. 07/25/21  Yes Caryl Ada K, PA-C  ondansetron (ZOFRAN) 4 MG tablet Take 1 tablet (4 mg total) by mouth daily as needed for nausea or vomiting. 07/25/21 07/25/22 Yes Fransico Meadow, PA-C  oxyCODONE-acetaminophen (PERCOCET) 5-325 MG tablet Take 1 tablet by mouth every 4 (four) hours as needed for severe pain. 07/25/21 07/25/22 Yes Fransico Meadow, PA-C  tamsulosin (FLOMAX) 0.4 MG CAPS capsule Take 1 capsule (0.4 mg total) by mouth daily. 07/25/21  Yes Caryl Ada K, PA-C  ALPRAZolam Duanne Moron) 1 MG tablet Take 1 tablet (1 mg total) by mouth 4 (four) times daily as needed (fill 03/22/21) 03/22/21     ALPRAZolam (XANAX) 1 MG tablet Take 1 tablet by mouth 4 times daily as needed 06/27/21     amphetamine-dextroamphetamine (ADDERALL) 20 MG tablet Take 1 tablet (20 mg total) by mouth 3 (three) times daily (fill 03/25/21) Patient taking differently: Take  20 mg by mouth 3 (three) times daily. 03/25/21     amphetamine-dextroamphetamine (ADDERALL) 20 MG tablet Take 1 tablet by mouth 3 times daily 06/27/21     Ascorbic Acid (VITAMIN C PO) Take by mouth daily. Patient not taking: Reported on 05/18/2021    [provider]  baclofen (LIORESAL) 10 MG tablet Take 1 tablet (10 mg total) by mouth 2 (two) times daily as needed Limit 1-2 treatments per week Patient not taking: Reported on 05/18/2021 11/19/20     Cholecalciferol (VITAMIN D3 PO) Take by mouth daily. Patient not taking: Reported on 05/18/2021    [provider]  gabapentin (NEURONTIN) 300 MG capsule Take 1 capsule by mouth twice a day as needed for pain Patient not taking: Reported on 05/18/2021 05/09/21     HYDROcodone-acetaminophen (NORCO) 10-325 MG tablet Take 1 tablet by mouth every 6 (six) hours as needed for severe pain. Patient not taking: Reported on 05/18/2021 04/26/21   Britt Bottom, PA-C  HYDROcodone-acetaminophen (NORCO/VICODIN) 5-325 MG tablet Take 1 tablet by mouth every 6 (six) hours as needed for moderate pain. 05/26/21   Cornett, Marcello Moores, MD  ibuprofen (ADVIL) 800 MG tablet Take 1 tablet (800 mg total) by mouth every 8 (eight) hours as needed. 05/26/21   Erroll Luna, MD  meloxicam (MOBIC) 15 MG tablet Take 1 tablet (15 mg total) by mouth once a day for pain and inflammation 04/26/21   Britt Bottom,  PA-C  montelukast (SINGULAIR) 10 MG tablet Take 1 tablet by mouth every evening Patient taking differently: Take 10 mg by mouth at bedtime. 04/18/21     nitrofurantoin, macrocrystal-monohydrate, (MACROBID) 100 MG capsule Take 1 capsule by mouth every 12 hours as directed for 7 days. 07/11/21     omeprazole (PRILOSEC) 40 MG capsule TAKE 1 CAPSULE BY MOUTH ONCE DAILY BEFORE BREAKFAST Patient taking differently: Take 40 mg by mouth daily. 07/06/20 07/06/21  Milus Banister, MD  ondansetron (ZOFRAN ODT) 4 MG disintegrating tablet Dissolve1 tablet (4 mg total) by mouth 2  (two) times daily as needed for nausea or vomiting. Patient not taking: Reported on 05/18/2021 04/26/21   Britt Bottom, PA-C  SUMAtriptan (IMITREX) 50 MG tablet 1 tablet at onset of headache followed by 1 tablet 2 hours later if symptoms persist    [provider]  SUMAtriptan (IMITREX) 50 MG tablet Take 1 tablet by mouth at onset of headache followed by 1 tablet 2 hours later if symptoms persist 06/07/21     triamcinolone ointment (KENALOG) 0.1 % APPLY A THIN LAYER TO THE AFFECTED AREA(S) OF SKIN 2 TIMES PER DAY 04/04/21     zolpidem (AMBIEN) 5 MG tablet Take 1 tablet by mouth at bedtime as directed 05/20/21     zonisamide (ZONEGRAN) 25 MG capsule Take 4 capsules (100 mg total) by mouth every evening. Patient not taking: Reported on 05/18/2021 11/19/20         Allergies    Dilaudid [hydromorphone hcl], Ceftriaxone sodium, and Latex    Review of Systems   Review of Systems  Gastrointestinal:  Positive for abdominal pain and nausea. Negative for vomiting.  All other systems reviewed and are negative.  Physical Exam Updated Vital Signs BP 106/72 (BP Location: Right Arm)    Pulse 83    Temp 98 F (36.7 C) (Oral)    Resp 18    Ht 5\' 9"  (1.753 m)    Wt 96.6 kg    LMP 07/15/2021 Comment: neg upreg in er today.   SpO2 95%    BMI 31.45 kg/m  Physical Exam Vitals and nursing note reviewed.  Constitutional:      Appearance: She is well-developed.  HENT:     Head: Normocephalic.  Eyes:     Extraocular Movements: Extraocular movements intact.     Pupils: Pupils are equal, round, and reactive to light.  Cardiovascular:     Rate and Rhythm: Normal rate and regular rhythm.  Pulmonary:     Effort: Pulmonary effort is normal.  Abdominal:     General: Abdomen is flat. Bowel sounds are normal. There is no distension.     Palpations: Abdomen is soft.     Tenderness: There is abdominal tenderness in the right lower quadrant.  Musculoskeletal:        General: Normal range of motion.      Cervical back: Normal range of motion.  Skin:    General: Skin is warm.  Neurological:     Mental Status: She is alert and oriented to person, place, and time.    ED Results / Procedures / Treatments   Labs (all labs ordered are listed, but only abnormal results are displayed) Labs Reviewed  URINALYSIS, ROUTINE W REFLEX MICROSCOPIC - Abnormal; Notable for the following components:      Result Value   APPearance HAZY (*)    Hgb urine dipstick MODERATE (*)    Protein, ur 30 (*)    Leukocytes,Ua MODERATE (*)  All other components within normal limits  URINALYSIS, MICROSCOPIC (REFLEX) - Abnormal; Notable for the following components:   Bacteria, UA MANY (*)    All other components within normal limits  CBC WITH DIFFERENTIAL/PLATELET - Abnormal; Notable for the following components:   MCH 25.4 (*)    RDW 15.7 (*)    All other components within normal limits  COMPREHENSIVE METABOLIC PANEL - Abnormal; Notable for the following components:   Glucose, Bld 103 (*)    All other components within normal limits  PREGNANCY, URINE    EKG None  Radiology CT ABDOMEN PELVIS W CONTRAST  Result Date: 07/25/2021 CLINICAL DATA:  Right lower quadrant pain radiating to right flank since last night. EXAM: CT ABDOMEN AND PELVIS WITH CONTRAST TECHNIQUE: Multidetector CT imaging of the abdomen and pelvis was performed using the standard protocol following bolus administration of intravenous contrast. RADIATION DOSE REDUCTION: This exam was performed according to the departmental dose-optimization program which includes automated exposure control, adjustment of the mA and/or kV according to patient size and/or use of iterative reconstruction technique. CONTRAST:  13mL OMNIPAQUE IOHEXOL 300 MG/ML  SOLN COMPARISON:  April 29, 2021 FINDINGS: Lower chest: Surgical clips in the paramedian right breast. No acute abnormality. Hepatobiliary: No suspicious hepatic lesion. Gallbladder is unremarkable. No biliary  ductal dilation. Pancreas: No pancreatic ductal dilation or evidence of acute inflammation. Spleen: Normal in size without focal abnormality. Adrenals/Urinary Tract: Nodular thickening of the medial limb of the left adrenal gland is unchanged dating back to October 09, 2016 consistent with a benign finding. Right adrenal gland is normal. Edematous right kidney with hydroureteronephrosis to the level of a 5-6 mm stone in the distal right ureter on image 67/2, with urothelial hyperenhancement and perinephric/periureteral stranding. Additional nonobstructive right renal stones measuring up to 10 mm. Nonobstructive punctate left upper pole renal stone. No left-sided hydronephrosis. Left lower/interpolar renal lesion measures 18 mm on image 37/2 demonstrating Hounsfield units of 55, greater than that expected for a simple cyst. 3.9 cm left interpolar renal cyst. Urinary bladder is unremarkable for degree of distension. Stomach/Bowel: Radiopaque enteric contrast material traverses distal loops of small bowel. Stomach is unremarkable for degree of distension. No pathologic dilation of small or large bowel. The appendix and terminal ileum appear normal. No evidence of acute bowel inflammation. Vascular/Lymphatic: No abdominal aortic aneurysm. No pathologically enlarged abdominal or pelvic lymph nodes. Reproductive: Enlarged leiomyomatous uterus appears similar prior. Nodular hyperdense focus measuring 14 mm in the endometrium at the uterine fundus. No suspicious adnexal mass. Other: Trace pelvic free fluid is within physiologic normal limits. Musculoskeletal: Multilevel degenerative changes spine. No acute osseous abnormality. IMPRESSION: 1. Edematous right kidney with urothelial hyperenhancement and hydroureteronephrosis to the level of a 5-6 mm stone in the distal right ureter, recommend correlation with laboratory values to exclude superimposed infection. 2. Additional nonobstructive bilateral renal stones measuring up to  10 mm. 3. Left lower/interpolar renal lesion measures 18 mm with Hounsfield units greater than that expected for a simple cyst, possibly reflecting a hemorrhagic/proteinaceous cyst but enhancing renal mass not excluded and is incompletely evaluated for on this single-phase study. Recommend further/more definitive characterization with nonemergent renal protocol MRI with and without contrast. 4. Enlarged leiomyomatous uterus. Nodular hyperdense focus measuring 14 mm in the endometrium at the uterine fundus, incompletely evaluated on this study and possibly representing a submucosal fibroid or intrauterine blood products, consider further evaluation with dedicated pelvic ultrasound. Electronically Signed   By: Dahlia Bailiff M.D.   On: 07/25/2021 11:45  Procedures Procedures    Medications Ordered in ED Medications  morphine (PF) 2 MG/ML injection 2 mg (2 mg Intravenous Given 07/25/21 1026)  ondansetron (ZOFRAN) injection 4 mg (4 mg Intravenous Given 07/25/21 1025)  iohexol (OMNIPAQUE) 300 MG/ML solution 100 mL (100 mLs Intravenous Contrast Given 07/25/21 1114)  morphine (PF) 4 MG/ML injection 4 mg (4 mg Intravenous Given 07/25/21 1152)  fentaNYL (SUBLIMAZE) injection 100 mcg (100 mcg Intravenous Given 07/25/21 1234)  ketorolac (TORADOL) 30 MG/ML injection 30 mg (30 mg Intravenous Given 07/25/21 1234)  fentaNYL (SUBLIMAZE) injection 100 mcg (100 mcg Intravenous Given 07/25/21 1508)  ondansetron (ZOFRAN-ODT) disintegrating tablet 4 mg (4 mg Oral Given 07/25/21 1620)  oxyCODONE-acetaminophen (PERCOCET/ROXICET) 5-325 MG per tablet 2 tablet (2 tablets Oral Given 07/25/21 1623)  cephALEXin (KEFLEX) capsule 250 mg (250 mg Oral Given 07/25/21 1622)    ED Course/ Medical Decision Making/ A&P                           Medical Decision Making Amount and/or Complexity of Data Reviewed External Data Reviewed: notes.    Details: Oncology notes reviewed Labs: ordered. Decision-making details documented in ED Course.     Details: UA show many bacteria.  Wbc count is normal Radiology: ordered and independent interpretation performed. Decision-making details documented in ED Course.    Details: ct scan reviewed.  Pt has a 5-57mm stone in the right distal ureter. left renal mass and 92mm uterine mass probable fibroid  Risk Prescription drug management. Parenteral controlled substances. Decision regarding hospitalization. Risk Details: Pt wants to go home.  Pt given rx for percocet, zofran. Flomax, keflex and diflucan.  Pt advised of need to schedule follow up with Urology            Final Clinical Impression(s) / ED Diagnoses Final diagnoses:  Acute cystitis without hematuria    Rx / DC Orders ED Discharge Orders          Ordered    oxyCODONE-acetaminophen (PERCOCET) 5-325 MG tablet  Every 4 hours PRN        07/25/21 1814    ondansetron (ZOFRAN) 4 MG tablet  Daily PRN        07/25/21 1814    tamsulosin (FLOMAX) 0.4 MG CAPS capsule  Daily        07/25/21 1814    cephALEXin (KEFLEX) 500 MG capsule  4 times daily        07/25/21 1830    fluconazole (DIFLUCAN) 150 MG tablet        07/25/21 1830           An After Visit Summary was printed and given to the patient.    Fransico Meadow, Hershal Coria 07/25/21 Sanjuan Dame, MD 07/27/21 [phone]

## 2021-07-26 ENCOUNTER — Other Ambulatory Visit (HOSPITAL_COMMUNITY): Payer: Self-pay

## 2021-07-26 ENCOUNTER — Inpatient Hospital Stay (HOSPITAL_COMMUNITY): Payer: 59 | Admitting: Anesthesiology

## 2021-07-26 ENCOUNTER — Other Ambulatory Visit: Payer: Self-pay

## 2021-07-26 ENCOUNTER — Encounter (HOSPITAL_COMMUNITY): Payer: Self-pay

## 2021-07-26 ENCOUNTER — Inpatient Hospital Stay (HOSPITAL_COMMUNITY): Payer: 59

## 2021-07-26 ENCOUNTER — Inpatient Hospital Stay: Admit: 2021-07-26 | Payer: 59 | Admitting: Urology

## 2021-07-26 ENCOUNTER — Encounter (HOSPITAL_COMMUNITY)
Admission: EM | Disposition: A | Discharge status: Home / Self Care | Payer: Self-pay | Source: Home / Self Care | Attending: Internal Medicine

## 2021-07-26 ENCOUNTER — Other Ambulatory Visit: Payer: Self-pay | Admitting: Urology

## 2021-07-26 ENCOUNTER — Inpatient Hospital Stay (HOSPITAL_COMMUNITY)
Admission: EM | Admit: 2021-07-26 | Discharge: 2021-07-29 | DRG: 854 | Disposition: A | Discharge status: Home / Self Care | Payer: 59 | Attending: Internal Medicine | Admitting: Internal Medicine

## 2021-07-26 ENCOUNTER — Ambulatory Visit: Payer: 59

## 2021-07-26 DIAGNOSIS — N12 Tubulo-interstitial nephritis, not specified as acute or chronic: Secondary | ICD-10-CM | POA: Diagnosis present

## 2021-07-26 DIAGNOSIS — Z170 Estrogen receptor positive status [ER+]: Secondary | ICD-10-CM

## 2021-07-26 DIAGNOSIS — F909 Attention-deficit hyperactivity disorder, unspecified type: Secondary | ICD-10-CM | POA: Diagnosis present

## 2021-07-26 DIAGNOSIS — D72829 Elevated white blood cell count, unspecified: Secondary | ICD-10-CM

## 2021-07-26 DIAGNOSIS — N136 Pyonephrosis: Secondary | ICD-10-CM | POA: Diagnosis present

## 2021-07-26 DIAGNOSIS — N289 Disorder of kidney and ureter, unspecified: Secondary | ICD-10-CM

## 2021-07-26 DIAGNOSIS — N202 Calculus of kidney with calculus of ureter: Secondary | ICD-10-CM | POA: Diagnosis present

## 2021-07-26 DIAGNOSIS — Z87442 Personal history of urinary calculi: Secondary | ICD-10-CM

## 2021-07-26 DIAGNOSIS — N1330 Unspecified hydronephrosis: Secondary | ICD-10-CM

## 2021-07-26 DIAGNOSIS — F411 Generalized anxiety disorder: Secondary | ICD-10-CM | POA: Diagnosis present

## 2021-07-26 DIAGNOSIS — E210 Primary hyperparathyroidism: Secondary | ICD-10-CM | POA: Diagnosis present

## 2021-07-26 DIAGNOSIS — Z803 Family history of malignant neoplasm of breast: Secondary | ICD-10-CM

## 2021-07-26 DIAGNOSIS — Z8616 Personal history of COVID-19: Secondary | ICD-10-CM

## 2021-07-26 DIAGNOSIS — N10 Acute tubulo-interstitial nephritis: Secondary | ICD-10-CM

## 2021-07-26 DIAGNOSIS — D259 Leiomyoma of uterus, unspecified: Secondary | ICD-10-CM

## 2021-07-26 DIAGNOSIS — Z885 Allergy status to narcotic agent status: Secondary | ICD-10-CM

## 2021-07-26 DIAGNOSIS — N179 Acute kidney failure, unspecified: Secondary | ICD-10-CM | POA: Diagnosis not present

## 2021-07-26 DIAGNOSIS — N201 Calculus of ureter: Secondary | ICD-10-CM

## 2021-07-26 DIAGNOSIS — K219 Gastro-esophageal reflux disease without esophagitis: Secondary | ICD-10-CM | POA: Diagnosis present

## 2021-07-26 DIAGNOSIS — Z881 Allergy status to other antibiotic agents status: Secondary | ICD-10-CM

## 2021-07-26 DIAGNOSIS — F419 Anxiety disorder, unspecified: Secondary | ICD-10-CM | POA: Diagnosis not present

## 2021-07-26 DIAGNOSIS — Z6831 Body mass index (BMI) 31.0-31.9, adult: Secondary | ICD-10-CM

## 2021-07-26 DIAGNOSIS — D0511 Intraductal carcinoma in situ of right breast: Secondary | ICD-10-CM | POA: Diagnosis present

## 2021-07-26 DIAGNOSIS — G43909 Migraine, unspecified, not intractable, without status migrainosus: Secondary | ICD-10-CM | POA: Diagnosis present

## 2021-07-26 DIAGNOSIS — A419 Sepsis, unspecified organism: Principal | ICD-10-CM | POA: Diagnosis present

## 2021-07-26 DIAGNOSIS — E669 Obesity, unspecified: Secondary | ICD-10-CM | POA: Diagnosis present

## 2021-07-26 DIAGNOSIS — Z20822 Contact with and (suspected) exposure to covid-19: Secondary | ICD-10-CM | POA: Diagnosis present

## 2021-07-26 DIAGNOSIS — Z79899 Other long term (current) drug therapy: Secondary | ICD-10-CM

## 2021-07-26 DIAGNOSIS — Z91040 Latex allergy status: Secondary | ICD-10-CM

## 2021-07-26 DIAGNOSIS — E876 Hypokalemia: Secondary | ICD-10-CM | POA: Diagnosis present

## 2021-07-26 DIAGNOSIS — N390 Urinary tract infection, site not specified: Secondary | ICD-10-CM

## 2021-07-26 HISTORY — PX: CYSTOSCOPY WITH STENT PLACEMENT: SHX5790

## 2021-07-26 LAB — CBC
HCT: 39.6 % (ref 36.0–46.0)
HCT: 34.4 % — ABNORMAL LOW (ref 36.0–46.0)
Hemoglobin: 12.7 g/dL (ref 12.0–15.0)
Hemoglobin: 11.2 g/dL — ABNORMAL LOW (ref 12.0–15.0)
MCH: 26 pg (ref 26.0–34.0)
MCH: 25.9 pg — ABNORMAL LOW (ref 26.0–34.0)
MCHC: 32.1 g/dL (ref 30.0–36.0)
MCHC: 32.6 g/dL (ref 30.0–36.0)
MCV: 81 fL (ref 80.0–100.0)
MCV: 79.4 fL — ABNORMAL LOW (ref 80.0–100.0)
Platelets: 183 10*3/uL (ref 150–400)
Platelets: 119 10*3/uL — ABNORMAL LOW (ref 150–400)
RBC: 4.89 MIL/uL (ref 3.87–5.11)
RBC: 4.33 MIL/uL (ref 3.87–5.11)
RDW: 16.2 % — ABNORMAL HIGH (ref 11.5–15.5)
RDW: 16.2 % — ABNORMAL HIGH (ref 11.5–15.5)
WBC: 21.9 10*3/uL — ABNORMAL HIGH (ref 4.0–10.5)
WBC: 23.5 10*3/uL — ABNORMAL HIGH (ref 4.0–10.5)
nRBC: 0 % (ref 0.0–0.2)
nRBC: 0 % (ref 0.0–0.2)

## 2021-07-26 LAB — BASIC METABOLIC PANEL
Anion gap: 8 (ref 5–15)
BUN: 17 mg/dL (ref 6–20)
CO2: 19 mmol/L — ABNORMAL LOW (ref 22–32)
Calcium: 9.4 mg/dL (ref 8.9–10.3)
Chloride: 108 mmol/L (ref 98–111)
Creatinine, Ser: 1.56 mg/dL — ABNORMAL HIGH (ref 0.44–1.00)
GFR, Estimated: 41 mL/min — ABNORMAL LOW (ref >60)
Glucose, Bld: 160 mg/dL — ABNORMAL HIGH (ref 70–99)
Potassium: 3.1 mmol/L — ABNORMAL LOW (ref 3.5–5.1)
Sodium: 135 mmol/L (ref 135–145)

## 2021-07-26 LAB — COMPREHENSIVE METABOLIC PANEL
ALT: 29 U/L (ref 0–44)
AST: 32 U/L (ref 15–41)
Albumin: 3.5 g/dL (ref 3.5–5.0)
Alkaline Phosphatase: 57 U/L (ref 38–126)
Anion gap: 8 (ref 5–15)
BUN: 18 mg/dL (ref 6–20)
CO2: 25 mmol/L (ref 22–32)
Calcium: 10 mg/dL (ref 8.9–10.3)
Chloride: 102 mmol/L (ref 98–111)
Creatinine, Ser: 1.6 mg/dL — ABNORMAL HIGH (ref 0.44–1.00)
GFR, Estimated: 40 mL/min — ABNORMAL LOW (ref >60)
Glucose, Bld: 126 mg/dL — ABNORMAL HIGH (ref 70–99)
Potassium: 3.5 mmol/L (ref 3.5–5.1)
Sodium: 135 mmol/L (ref 135–145)
Total Bilirubin: 1.3 mg/dL — ABNORMAL HIGH (ref 0.3–1.2)
Total Protein: 7 g/dL (ref 6.5–8.1)

## 2021-07-26 LAB — RESP PANEL BY RT-PCR (FLU A&B, COVID) ARPGX2
Influenza A by PCR: NEGATIVE
Influenza B by PCR: NEGATIVE
SARS Coronavirus 2 by RT PCR: NEGATIVE

## 2021-07-26 LAB — URINALYSIS, ROUTINE W REFLEX MICROSCOPIC
Bilirubin Urine: NEGATIVE
Glucose, UA: NEGATIVE mg/dL
Ketones, ur: 20 mg/dL — AB
Nitrite: NEGATIVE
Protein, ur: 30 mg/dL — AB
RBC / HPF: >50 RBC/hpf — ABNORMAL HIGH (ref 0–5)
Specific Gravity, Urine: 1.019 (ref 1.005–1.030)
WBC, UA: >50 WBC/hpf — ABNORMAL HIGH (ref 0–5)
pH: 7 (ref 5.0–8.0)

## 2021-07-26 LAB — LACTIC ACID, PLASMA: Lactic Acid, Venous: 1.6 mmol/L (ref 0.5–1.9)

## 2021-07-26 LAB — PROCALCITONIN: Procalcitonin: 55.76 ng/mL

## 2021-07-26 SURGERY — CYSTOSCOPY, WITH STENT INSERTION
Anesthesia: General | Site: Renal | Laterality: Right

## 2021-07-26 MED ORDER — ENOXAPARIN SODIUM 40 MG/0.4ML IJ SOSY
40.0000 mg | PREFILLED_SYRINGE | INTRAMUSCULAR | Status: DC
  Administered 2021-07-27 – 2021-07-28 (×2): 40 mg via SUBCUTANEOUS
  Filled 2021-07-26 (×2): qty 0.4

## 2021-07-26 MED ORDER — ONDANSETRON HCL 4 MG/2ML IJ SOLN: 4.0000 mg | Freq: Four times a day (QID) | INTRAMUSCULAR | Status: DC | PRN

## 2021-07-26 MED ORDER — PROPOFOL 10 MG/ML IV BOLUS
INTRAVENOUS | Status: AC
  Filled 2021-07-26: qty 20

## 2021-07-26 MED ORDER — ONDANSETRON HCL 4 MG PO TABS: 4.0000 mg | ORAL_TABLET | Freq: Four times a day (QID) | ORAL | Status: DC | PRN

## 2021-07-26 MED ORDER — ACETAMINOPHEN 10 MG/ML IV SOLN
1000.0000 mg | Freq: Four times a day (QID) | INTRAVENOUS | Status: AC
  Administered 2021-07-26 – 2021-07-27 (×4): 1000 mg via INTRAVENOUS
  Filled 2021-07-26 (×4): qty 100

## 2021-07-26 MED ORDER — TRAMADOL HCL 50 MG PO TABS
50.0000 mg | ORAL_TABLET | Freq: Four times a day (QID) | ORAL | Status: DC | PRN
  Administered 2021-07-27 – 2021-07-28 (×2): 50 mg via ORAL
  Administered 2021-07-28: 100 mg via ORAL
  Administered 2021-07-29: 50 mg via ORAL
  Filled 2021-07-26: qty 2
  Filled 2021-07-26 (×3): qty 1

## 2021-07-26 MED ORDER — SUMATRIPTAN SUCCINATE 50 MG PO TABS
50.0000 mg | ORAL_TABLET | ORAL | Status: DC | PRN
  Filled 2021-07-26: qty 1

## 2021-07-26 MED ORDER — SODIUM CHLORIDE 0.9 % IV SOLN: 1.0000 g | Freq: Once | INTRAVENOUS | Status: DC

## 2021-07-26 MED ORDER — PROMETHAZINE HCL 25 MG/ML IJ SOLN: 6.2500 mg | INTRAMUSCULAR | Status: DC | PRN

## 2021-07-26 MED ORDER — FENTANYL CITRATE PF 50 MCG/ML IJ SOSY: 25.0000 ug | PREFILLED_SYRINGE | INTRAMUSCULAR | Status: DC | PRN

## 2021-07-26 MED ORDER — MORPHINE SULFATE (PF) 4 MG/ML IV SOLN
4.0000 mg | Freq: Once | INTRAVENOUS | Status: AC
  Administered 2021-07-26: 4 mg via INTRAVENOUS
  Filled 2021-07-26: qty 1

## 2021-07-26 MED ORDER — SODIUM CHLORIDE 0.9 % IV SOLN
2.0000 g | Freq: Once | INTRAVENOUS | Status: AC
  Administered 2021-07-26: 2 g via INTRAVENOUS
  Filled 2021-07-26: qty 2

## 2021-07-26 MED ORDER — DEXAMETHASONE SODIUM PHOSPHATE 10 MG/ML IJ SOLN
10.0000 mg | Freq: Once | INTRAMUSCULAR | Status: AC
  Administered 2021-07-26: 10 mg via INTRAVENOUS
  Filled 2021-07-26: qty 1

## 2021-07-26 MED ORDER — PROPOFOL 10 MG/ML IV BOLUS
INTRAVENOUS | Status: DC | PRN
  Administered 2021-07-26: 150 mg via INTRAVENOUS

## 2021-07-26 MED ORDER — PHENYLEPHRINE HCL-NACL 20-0.9 MG/250ML-% IV SOLN
INTRAVENOUS | Status: DC | PRN
  Administered 2021-07-26: 50 ug/min via INTRAVENOUS

## 2021-07-26 MED ORDER — MIDAZOLAM HCL 2 MG/2ML IJ SOLN
INTRAMUSCULAR | Status: AC
  Filled 2021-07-26: qty 2

## 2021-07-26 MED ORDER — LACTATED RINGERS IV SOLN: INTRAVENOUS | Status: DC

## 2021-07-26 MED ORDER — LACTATED RINGERS IV SOLN
INTRAVENOUS | Status: DC | PRN
Start: 2021-07-26 — End: 2021-07-26

## 2021-07-26 MED ORDER — ACETAMINOPHEN 650 MG RE SUPP
975.0000 mg | Freq: Once | RECTAL | Status: AC
  Administered 2021-07-26: 975 mg via RECTAL
  Filled 2021-07-26: qty 1

## 2021-07-26 MED ORDER — SODIUM CHLORIDE 0.9 % IR SOLN
Status: DC | PRN
Start: 2021-07-26 — End: 2021-07-26
  Administered 2021-07-26: 3000 mL via INTRAVESICAL

## 2021-07-26 MED ORDER — ONDANSETRON HCL 4 MG/2ML IJ SOLN
INTRAMUSCULAR | Status: DC | PRN
Start: 2021-07-26 — End: 2021-07-26
  Administered 2021-07-26: 4 mg via INTRAVENOUS

## 2021-07-26 MED ORDER — FENTANYL CITRATE (PF) 100 MCG/2ML IJ SOLN
INTRAMUSCULAR | Status: AC
  Filled 2021-07-26: qty 2

## 2021-07-26 MED ORDER — MONTELUKAST SODIUM 10 MG PO TABS
10.0000 mg | ORAL_TABLET | Freq: Every day | ORAL | Status: DC
  Administered 2021-07-26 – 2021-07-28 (×3): 10 mg via ORAL
  Filled 2021-07-26 (×3): qty 1

## 2021-07-26 MED ORDER — SODIUM CHLORIDE 0.9 % IV BOLUS
1000.0000 mL | Freq: Once | INTRAVENOUS | Status: AC
  Administered 2021-07-26: 1000 mL via INTRAVENOUS

## 2021-07-26 MED ORDER — DIPHENHYDRAMINE HCL 50 MG/ML IJ SOLN
25.0000 mg | Freq: Once | INTRAMUSCULAR | Status: AC
Start: 2021-07-26 — End: 2021-07-26
  Administered 2021-07-26: 25 mg via INTRAVENOUS
  Filled 2021-07-26: qty 1

## 2021-07-26 MED ORDER — ACETAMINOPHEN 10 MG/ML IV SOLN
1000.0000 mg | Freq: Once | INTRAVENOUS | Status: DC | PRN
  Administered 2021-07-26: 1000 mg via INTRAVENOUS

## 2021-07-26 MED ORDER — DEXAMETHASONE SODIUM PHOSPHATE 10 MG/ML IJ SOLN
INTRAMUSCULAR | Status: DC | PRN
Start: 2021-07-26 — End: 2021-07-26
  Administered 2021-07-26: 5 mg via INTRAVENOUS

## 2021-07-26 MED ORDER — IOHEXOL 300 MG/ML  SOLN
INTRAMUSCULAR | Status: DC | PRN
  Administered 2021-07-26: 10 mL via URETHRAL

## 2021-07-26 MED ORDER — ACETAMINOPHEN 650 MG RE SUPP: 650.0000 mg | Freq: Four times a day (QID) | RECTAL | Status: DC | PRN

## 2021-07-26 MED ORDER — ACETAMINOPHEN 325 MG PO TABS: 650.0000 mg | ORAL_TABLET | Freq: Four times a day (QID) | ORAL | Status: DC | PRN

## 2021-07-26 MED ORDER — LACTATED RINGERS IV BOLUS
1000.0000 mL | Freq: Once | INTRAVENOUS | Status: AC
  Administered 2021-07-26: 1000 mL via INTRAVENOUS

## 2021-07-26 MED ORDER — PANTOPRAZOLE SODIUM 40 MG PO TBEC
40.0000 mg | DELAYED_RELEASE_TABLET | Freq: Every day | ORAL | Status: DC
  Administered 2021-07-27 – 2021-07-28 (×2): 40 mg via ORAL
  Filled 2021-07-26 (×2): qty 1

## 2021-07-26 MED ORDER — LACTATED RINGERS IV SOLN: INTRAVENOUS | Status: DC | PRN

## 2021-07-26 MED ORDER — HYDROMORPHONE HCL 1 MG/ML IJ SOLN: 0.5000 mg | INTRAMUSCULAR | Status: DC | PRN

## 2021-07-26 MED ORDER — MORPHINE SULFATE (PF) 4 MG/ML IV SOLN: 4.0000 mg | INTRAVENOUS | Status: DC | PRN

## 2021-07-26 MED ORDER — CIPROFLOXACIN IN D5W 400 MG/200ML IV SOLN
INTRAVENOUS | Status: AC
  Filled 2021-07-26: qty 200

## 2021-07-26 MED ORDER — SUCCINYLCHOLINE CHLORIDE 200 MG/10ML IV SOSY
PREFILLED_SYRINGE | INTRAVENOUS | Status: DC | PRN
  Administered 2021-07-26: 120 mg via INTRAVENOUS

## 2021-07-26 MED ORDER — 0.9 % SODIUM CHLORIDE (POUR BTL) OPTIME
TOPICAL | Status: DC | PRN
  Administered 2021-07-26: 1000 mL

## 2021-07-26 MED ORDER — FENTANYL CITRATE (PF) 100 MCG/2ML IJ SOLN
INTRAMUSCULAR | Status: DC | PRN
Start: 2021-07-26 — End: 2021-07-26
  Administered 2021-07-26 (×2): 25 ug via INTRAVENOUS

## 2021-07-26 MED ORDER — LIDOCAINE 2% (20 MG/ML) 5 ML SYRINGE
INTRAMUSCULAR | Status: DC | PRN
  Administered 2021-07-26: 100 mg via INTRAVENOUS

## 2021-07-26 MED ORDER — CIPROFLOXACIN IN D5W 400 MG/200ML IV SOLN
400.0000 mg | Freq: Two times a day (BID) | INTRAVENOUS | Status: DC
  Administered 2021-07-26 – 2021-07-27 (×3): 400 mg via INTRAVENOUS
  Filled 2021-07-26 (×3): qty 200

## 2021-07-26 MED ORDER — ACETAMINOPHEN 10 MG/ML IV SOLN
INTRAVENOUS | Status: AC
  Filled 2021-07-26: qty 100

## 2021-07-26 MED ORDER — ONDANSETRON HCL 4 MG/2ML IJ SOLN
4.0000 mg | Freq: Once | INTRAMUSCULAR | Status: AC
  Administered 2021-07-26: 4 mg via INTRAVENOUS
  Filled 2021-07-26: qty 2

## 2021-07-26 MED ORDER — HYDRALAZINE HCL 20 MG/ML IJ SOLN: 5.0000 mg | INTRAMUSCULAR | Status: DC | PRN

## 2021-07-26 MED ORDER — CHLORHEXIDINE GLUCONATE 0.12 % MT SOLN
15.0000 mL | OROMUCOSAL | Status: AC
  Administered 2021-07-26: 15 mL via OROMUCOSAL

## 2021-07-26 MED ORDER — ALPRAZOLAM 1 MG PO TABS: 1.0000 mg | ORAL_TABLET | Freq: Four times a day (QID) | ORAL | Status: DC | PRN

## 2021-07-26 SURGICAL SUPPLY — 11 items
BAG URO CATCHER STRL LF (MISCELLANEOUS) ×2 IMPLANT
CATH URETL OPEN 5X70 (CATHETERS) ×2 IMPLANT
CLOTH BEACON ORANGE TIMEOUT ST (SAFETY) ×2 IMPLANT
GLOVE SURG ENC TEXT LTX SZ7.5 (GLOVE) ×2 IMPLANT
GLOVE SURG UNDER POLY LF SZ6 (GLOVE) ×2 IMPLANT
GOWN STRL REUS W/TWL XL LVL3 (GOWN DISPOSABLE) ×3 IMPLANT
GUIDEWIRE STR DUAL SENSOR (WIRE) ×2 IMPLANT
MANIFOLD NEPTUNE II (INSTRUMENTS) ×2 IMPLANT
PACK CYSTO (CUSTOM PROCEDURE TRAY) ×2 IMPLANT
STENT URET 6FRX26 CONTOUR (STENTS) ×1 IMPLANT
TUBING CONNECTING 10 (TUBING) IMPLANT

## 2021-07-26 NOTE — ED Notes (Signed)
EDP, Dr. Ashok Cordia notified face-to-face pt is febrile at 100.72F orally at this time with a HR in the 140's, breathing around 30x/min. No additional orders at this time. Will continue to monitor.

## 2021-07-26 NOTE — ED Notes (Signed)
Hospitalist re-paged regarding pt's fever. Will await further orders. Pt has ice-packs applied to bilateral under-arms and groin at this time.

## 2021-07-26 NOTE — ED Notes (Signed)
EDP declines sepsis blood work at this time.

## 2021-07-26 NOTE — ED Notes (Signed)
Pt assisted to bedside commode with minimal assistance. Urine sample provided and sent to lab at this time. Pt requesting to take gown off d/t being hot." Gown removed at pt's request and pt helped into ED stretcher and covered with a sheet. Fan blowing on pt at this time as well as cool washcloth placed on pt's head.

## 2021-07-26 NOTE — Assessment & Plan Note (Addendum)
-   Patient follows with Dr. Lindi Adie as an outpatient. ER/PR positive. Patient with a history of right lumpectomy. - currently receiving adjuvant radiation therapy with plan to follow with tamoxifen x5 years.

## 2021-07-26 NOTE — Op Note (Signed)
Preoperative diagnosis:  Right infected ureteral stone   Postoperative diagnosis:  same   Procedure:  Cystoscopy right ureteral stent placement right retrograde pyelography with interpretation   Surgeon: Ardis Hughs, MD  Anesthesia: General  Complications: None  Intraoperative findings:   #1: Cystoscopy demonstrated the stone crowning at the patient's right ureteral orifice.  I was unable to remove it with a stent grasper given the size of it.  In order to bypass the stone I had to push it back into the ureter.  Once I did that there was a strong E flux of turbid/pasty appearing urine.   #2: Right retrograde pyelography demonstrated a filling defect within the distal right ureter consistent with the patients known calculus with significant hydronephrosis.  I did was careful not to inject too much contrast and to avoid iatrogenic bacteremia in the collecting system was not well opacified.  EBL: Minimal  Specimens: None  Indication: Misty [name] is a 46 y.o. patient with infected right distal ureteral stone. After reviewing the management options for treatment, he elected to proceed with the above surgical procedure(s). We have discussed the potential benefits and risks of the procedure, side effects of the proposed treatment, the likelihood of the patient achieving the goals of the procedure, and any potential problems that might occur during the procedure or recuperation. Informed consent has been obtained.  Description of procedure:  The patient was taken to the operating room and general anesthesia was induced.  The patient was placed in the dorsal lithotomy position, prepped and draped in the usual sterile fashion, and preoperative antibiotics were administered. A preoperative time-out was performed.   Cystourethroscopy was performed.  The patients urethra was examined and was normal. The bladder was then systematically examined in its entirety.  The above findings  were noted.  There was no evidence for any bladder tumors or other mucosal pathology.  I attempted to grasp the stone using the stent grasper that was emanating from the patient's right ureteral orifice unsuccessfully.  Attention then turned to the rightureteral orifice and a ureteral catheter was used to intubate the ureteral orifice.  Omnipaque contrast was injected through the ureteral catheter and a retrograde pyelogram was performed with findings as dictated above.  A 0.38 sensor guidewire was then advanced up the right ureter into the renal pelvis under fluoroscopic guidance.  The wire was then backloaded through the cystoscope and a ureteral stent was advance over the wire using Seldinger technique.  The stent was positioned appropriately under fluoroscopic and cystoscopic guidance.  The wire was then removed with an adequate stent curl noted in the renal pelvis as well as in the bladder.  A Foley catheter was placed.  The bladder was then emptied and the procedure ended.  The patient appeared to tolerate the procedure well and without complications.  The patient was able to be awakened and transferred to the recovery unit in satisfactory condition.    Ardis Hughs, M.D.

## 2021-07-26 NOTE — ED Triage Notes (Addendum)
Pt presents via POV for weakness. Pt was seen at [facility] yesterday for RLQ pain and diagnosed with kidney stones, one of which is in the right ureter, as well as UTI. Pt was d/c'd with a prescription for abx, pain medication, and tamsulosin and was told to f/u with Uro. She states she woke this morning weakness and headache as well as nausea. Pt is undergoing radiation therapy for breast cancer and last radiation treatment was yesterday. Pt has a known past hx of kidney stones.

## 2021-07-26 NOTE — Anesthesia Postprocedure Evaluation (Signed)
Anesthesia Post Note  Patient: Misty [name]  Procedure(s) Performed: CYSTOSCOPY WITH STENT PLACEMENT (Right: Renal)     Patient location during evaluation: PACU Anesthesia Type: General Level of consciousness: sedated and patient cooperative Pain management: pain level controlled Vital Signs Assessment: post-procedure vital signs reviewed and stable Respiratory status: spontaneous breathing Cardiovascular status: stable Anesthetic complications: no   No notable events documented.  Last Vitals:  Vitals:   07/26/21 1800 07/26/21 1818  BP: 95/72 96/69  Pulse: (!) 111 (!) 114  Resp: 20 20  Temp:  36.9 C  SpO2: 94% 100%    Last Pain:  Vitals:   07/26/21 1818  TempSrc: Oral  PainSc:                  Nolon Nations

## 2021-07-26 NOTE — Assessment & Plan Note (Addendum)
Resolved

## 2021-07-26 NOTE — ED Notes (Signed)
This nurse entered the room to start second IV access and draw ordered blood cultures. Ice packs had been removed by pt and sitting in the sink, however, pt temp now 99.17F orally.

## 2021-07-26 NOTE — ED Notes (Signed)
EDP notified of pt's tachycardic HR of 142 at this time and oral temp of 100.51F. Awaiting further orders. Will continue to monitor.

## 2021-07-26 NOTE — ED Notes (Signed)
EKG captured at this time.

## 2021-07-26 NOTE — Assessment & Plan Note (Addendum)
-  Left lower/interpolar renal lesion. 18 mm. Concern for possible hemorrhagic vs proteinaceous vs renal mass. Recommendation for non-emergent renal protocol MRI w/wo contrast.  - will need to be followed up outpatient

## 2021-07-26 NOTE — Progress Notes (Signed)
°   07/26/21 1818  Assess: MEWS Score  Temp 98.5 F (36.9 C)  BP 96/69  Pulse Rate (!) 114  Resp 20  Level of Consciousness Alert  SpO2 100 %  O2 Device Room Air  Assess: MEWS Score  MEWS Temp 0  MEWS Systolic 1  MEWS Pulse 2  MEWS RR 0  MEWS LOC 0  MEWS Score 3  MEWS Score Color Yellow  Assess: if the MEWS score is Yellow or Red  Were vital signs taken at a resting state? Yes  Focused Assessment No change from prior assessment  Does the patient meet 2 or more of the SIRS criteria? Yes  Does the patient have a confirmed or suspected source of infection? No  MEWS guidelines implemented *See Row Information* No, previously yellow, continue vital signs every 4 hours   Will continue Q4vital signs per protocol.   Layla Maw, RN

## 2021-07-26 NOTE — ED Notes (Signed)
OR here to take pt.

## 2021-07-26 NOTE — ED Notes (Addendum)
This nurse contacted the OR regarding pt's OR time which they have stated as 1600 today, despite Urology note stating "urgent stent placement." Read the Uro note and they are recommending "urgent stent placement." Pt receiving liters number 2 and 3 of IVF at this time and has not yet given a urine. Dr. Louis Meckel, Urology and Dr. Ashok Cordia, EDP, notified and aware of above. EDP's recommendation to bladder scan pt at this time and Urology recommends scaling back pt's IVF to 100cc's/hr. Hospitalist, Dr. Azalee Course in agreement regarding IVF intake. Pt's second liter of IVF decreased to 173mL/hr at this time and 3rd liter paused at this time. Will continue to monitor.

## 2021-07-26 NOTE — ED Notes (Signed)
Hospitalist at the bedside 

## 2021-07-26 NOTE — Assessment & Plan Note (Addendum)
-   Secondary to sepsis, hydronephrosis, ureteral obstruction due to infected stone.   -Presented with creatinine of 1.6 on admission, resolved.  Creatinine 0.7

## 2021-07-26 NOTE — Anesthesia Procedure Notes (Signed)
Date/Time: 07/26/2021 4:29 PM Performed by: Cynda Familia, CRNA Oxygen Delivery Method: Simple face mask Placement Confirmation: positive ETCO2 and breath sounds checked- equal and bilateral Dental Injury: Teeth and Oropharynx as per pre-operative assessment

## 2021-07-26 NOTE — Transfer of Care (Signed)
Immediate Anesthesia Transfer of Care Note  Patient: Misty [name]  Procedure(s) Performed: CYSTOSCOPY WITH STENT PLACEMENT (Right: Renal)  Patient Location: PACU  Anesthesia Type:General  Level of Consciousness: sedated  Airway & Oxygen Therapy: Patient Spontanous Breathing and Patient connected to face mask oxygen  Post-op Assessment: Report given to RN and Post -op Vital signs reviewed and stable  Post vital signs: Reviewed and stable  Last Vitals:  Vitals Value Taken Time  BP 98/68 07/26/21 1645  Temp    Pulse 120 07/26/21 1645  Resp 26 07/26/21 1645  SpO2 95 % 07/26/21 1645  Vitals shown include unvalidated device data.  Last Pain:  Vitals:   07/26/21 1521  TempSrc:   PainSc: 5          Complications: No notable events documented.

## 2021-07-26 NOTE — ED Notes (Signed)
Pharmacy paged, aware, and to send Azactam.

## 2021-07-26 NOTE — Hospital Course (Addendum)
Misty [name] is a 46 y.o. 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 Patient presented secondary to right sided flank pain. Patient reports pain in her right flank that was achy in nature. She left her work to go to [employer] [place] on 2/6 where she had a CT scan significant for nephrolithiasis with ureteral obstruction and evidence of infection. At the time, admission was recommended, but patient declined as she did not want to miss her radiation treatment. This morning, day of admission, she had progressive weakness and decided to return to the ED at [facility] for evaluation. She has nausea without vomiting. No dysuria. No palpitations.

## 2021-07-26 NOTE — Consult Note (Signed)
I have been asked to see the patient by Dr. Lajean Saver, for evaluation and management of right infected ureteral stone.  History of present illness: 46 year old female with a history of a right ureteral stone he was seen yesterday in the emergency department for renal colic.  CT scan was performed and demonstrated 5 to 6 mm stone in the right distal ureter.  There is also a large nonobstructing stone in the right upper moiety of her bifid right kidney.  She was treated with medical expulsion therapy.  There was concern for UTI at that time.  Over the last 24 hours she has started to develop chills, rigors, fatigue.  Her white count is gone from 6000-22,000.  She has acute kidney injury.  She has urine from yesterday that is concerning for infection.  In the emergency department the patient was noted to have a temperature of 102.3.  She was treated with rectal Tylenol as well as ice packs.  We are able to get it down to below 100.  However, her heart rate remained 130-150.  She is otherwise hemodynamically stable.   The patient has a history of nephrolithiasis, and has been treated several times in the past by Dr. Diona Fanti.  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  She has subsequently underwent a lumpectomy and is currently undergoing radiation therapy.  Review of systems: A 12 point comprehensive review of systems was obtained and is negative unless otherwise stated in the history of present illness.  Patient Active Problem List   Diagnosis Date Noted   Sepsis ([place]) 07/26/2021   Acute pyelonephritis 07/26/2021   Renal lesion 07/26/2021   Fibroid uterus 07/26/2021   Hydroureteronephrosis 07/26/2021   Genetic testing 06/22/2021   Family history of breast cancer 06/02/2021   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   Breast cancer ([place]) 04/28/2021   Migraine 04/28/2021   Anxiety 04/28/2021   AKI (acute kidney  injury) ([place]) 04/28/2021   Obesity (BMI 30-39.9)    UTI 04/10/2007   ALLERGIC RHINITIS 10/30/2006   NEPHROLITHIASIS, HX OF 10/30/2006    No current facility-administered medications on file prior to encounter.   Current Outpatient Medications on File Prior to Encounter  Medication Sig Dispense Refill   ALPRAZolam (XANAX) 1 MG tablet Take 1 tablet by mouth 4 times daily as needed (Patient taking differently: Take 1 mg by mouth 4 (four) times daily as needed for anxiety.) 360 tablet 0   amphetamine-dextroamphetamine (ADDERALL) 20 MG tablet Take 1 tablet by mouth 3 times daily 270 tablet 0   montelukast (SINGULAIR) 10 MG tablet Take 1 tablet by mouth every evening (Patient taking differently: Take 10 mg by mouth at bedtime.) 90 tablet 0   SUMAtriptan (IMITREX) 50 MG tablet Take 1 tablet by mouth at onset of headache followed by 1 tablet 2 hours later if symptoms persist 9 tablet 0   tamsulosin (FLOMAX) 0.4 MG CAPS capsule Take 1 capsule  by mouth daily. 15 capsule 0   ALPRAZolam (XANAX) 1 MG tablet Take 1 tablet (1 mg total) by mouth 4 (four) times daily as needed (fill 03/22/21) (Patient not taking: Reported on 07/26/2021) 360 tablet 0   amphetamine-dextroamphetamine (ADDERALL) 20 MG tablet Take 1 tablet (20 mg total) by mouth 3 (three) times daily (fill 03/25/21) (Patient not taking: Reported on 07/26/2021) 270 tablet 0   baclofen (LIORESAL) 10 MG tablet Take 1 tablet (10 mg total) by mouth 2 (two)  times daily as needed Limit 1-2 treatments per week (Patient not taking: Reported on 05/18/2021) 20 tablet 0   cephALEXin (KEFLEX) 500 MG capsule Take 1 capsule by mouth 4 times daily for 10 days. 40 capsule 0   fluconazole (DIFLUCAN) 150 MG tablet Take 1 tablet by mouth as needed. 2 tablet 0   gabapentin (NEURONTIN) 300 MG capsule Take 1 capsule by mouth twice a day as needed for pain (Patient not taking: Reported on 05/18/2021) 30 capsule 0   HYDROcodone-acetaminophen (NORCO) 10-325 MG tablet Take 1  tablet by mouth every 6 (six) hours as needed for severe pain. (Patient not taking: Reported on 05/18/2021) 28 tablet 0   HYDROcodone-acetaminophen (NORCO/VICODIN) 5-325 MG tablet Take 1 tablet by mouth every 6 (six) hours as needed for moderate pain. (Patient not taking: Reported on 07/26/2021) 15 tablet 0   ibuprofen (ADVIL) 800 MG tablet Take 1 tablet (800 mg total) by mouth every 8 (eight) hours as needed. (Patient not taking: Reported on 07/26/2021) 30 tablet 0   meloxicam (MOBIC) 15 MG tablet Take 1 tablet (15 mg total) by mouth once a day for pain and inflammation (Patient not taking: Reported on 07/26/2021) 30 tablet 0   nitrofurantoin, macrocrystal-monohydrate, (MACROBID) 100 MG capsule Take 1 capsule by mouth every 12 hours as directed for 7 days. (Patient not taking: Reported on 07/26/2021) 14 capsule 0   omeprazole (PRILOSEC) 40 MG capsule TAKE 1 CAPSULE BY MOUTH ONCE DAILY BEFORE BREAKFAST (Patient taking differently: Take 40 mg by mouth daily.) 30 capsule 10   ondansetron (ZOFRAN ODT) 4 MG disintegrating tablet Dissolve1 tablet (4 mg total) by mouth 2 (two) times daily as needed for nausea or vomiting. (Patient not taking: Reported on 05/18/2021) 6 tablet 0   ondansetron (ZOFRAN) 4 MG tablet Take 1 tablet by mouth daily as needed for nausea or vomiting. 30 tablet 1   oxyCODONE-acetaminophen (PERCOCET) 5-325 MG tablet Take 1 tablet by mouth every 4  hours as needed for severe pain. 30 tablet 0   triamcinolone ointment (KENALOG) 0.1 % APPLY A THIN LAYER TO THE AFFECTED AREA(S) OF SKIN 2 TIMES PER DAY (Patient not taking: Reported on 07/26/2021) 30 g 0   zolpidem (AMBIEN) 5 MG tablet Take 1 tablet by mouth at bedtime as directed (Patient not taking: Reported on 07/26/2021) 30 tablet 0   zonisamide (ZONEGRAN) 25 MG capsule Take 4 capsules (100 mg total) by mouth every evening. (Patient not taking: Reported on 05/18/2021) 120 capsule 1    Past Medical History:  Diagnosis Date   ADHD (attention deficit  hyperactivity disorder)    Anemia    Breast cancer ([place]) 04/28/2021   Environmental and seasonal allergies    Frequency of urination    GAD (generalized anxiety disorder)    Ganglion cyst of wrist, left    GERD (gastroesophageal reflux disease)    History of COVID-19 06/12/2020   result in epic;  per pt mild symptoms that resolved   History of kidney stones    Hyperparathyroidism, primary ([place])    currently followed by pcp  (previously seen endocrinologist-- dr Louie Boston note in epic 08-29-2017, doctor moved away)   Tenosynovitis, de Quervain    left wrist   Wears glasses     Past Surgical History:  Procedure Laterality Date   BREAST LUMPECTOMY WITH RADIOACTIVE SEED LOCALIZATION Right 05/26/2021   Procedure: RIGHT BREAST LUMPECTOMY WITH RADIOACTIVE SEED LOCALIZATION;  Surgeon: Erroll Luna, MD;  Location: [facility];  Service: General;  Laterality: Right;   CYSTO W/ RIGHT URETEROSCOPY  2006   CYSTO/ RIGHT URETERAL STENT PLACEMENT  07-29-2009   LARGE RIGHT RENAL CALCULUS   CYSTOLITHOLAPAXY OF LARGE BLADDER CALCULI/   RIGHT URETERAL STENT EXCHANGE  04-15-2010   CYSTOSCOPY WITH STENT PLACEMENT  07/04/2012   Procedure: CYSTOSCOPY WITH STENT PLACEMENT;  Surgeon: Franchot Gallo, MD;  Location: [facility];  Service: Urology;  Laterality: Right;  rt dbl j stent placement    CYSTOSCOPY/RETROGRADE/URETEROSCOPY/STONE EXTRACTION WITH BASKET  07/25/2012   Procedure: CYSTOSCOPY/RETROGRADE/URETEROSCOPY/STONE EXTRACTION WITH BASKET;  Surgeon: Franchot Gallo, MD;  Location: [facility];  Service: Urology;  Laterality: Right;   EXTRACORPOREAL SHOCK WAVE LITHOTRIPSY  08-02-2009   RIGHT   HOLMIUM LASER APPLICATION  0/11/2374   Procedure: HOLMIUM LASER APPLICATION;  Surgeon: Franchot Gallo, MD;  Location: [facility];  Service: Urology;  Laterality: Right;   LAPAROSCOPIC TUBAL LIGATION Bilateral 01/17/2013   Procedure: LAPAROSCOPIC TUBAL  LIGATION;  Surgeon: Melina Schools, MD;  Location: [facility];  Service: Gynecology;  Laterality: Bilateral;  1hr OR time    MINOR RELEASE DORSAL COMPARTMENT (DEQUERVAINS) Left 04/26/2021   Procedure: MINOR RELEASE DORSAL COMPARTMENT (DEQUERVAINS) WITH GANGLION EXCISION;  Surgeon: Renette Butters, MD;  Location: [facility];  Service: Orthopedics;  Laterality: Left;    Social History   Tobacco Use   Smoking status: Never   Smokeless tobacco: Never  Vaping Use   Vaping Use: Never used  Substance Use Topics   Alcohol use: Not Currently    Comment: occasional   Drug use: Never    Family History  Problem Relation Age of Onset   Breast cancer Maternal Aunt 68       DCIS   Esophageal cancer Maternal Uncle    Stomach cancer Neg Hx    Colon cancer Neg Hx    Pancreatic cancer Neg Hx     PE: Vitals:   07/26/21 1400 07/26/21 1436 07/26/21 1436 07/26/21 1516  BP: 121/76  109/71 119/76  Pulse: (!) 132   (!) 125  Resp: (!) 26   20  Temp:  99.3 F (37.4 C)  99.7 F (37.6 C)  TempSrc:  Oral  Oral  SpO2: 97%   100%  Weight:    96.6 kg  Height:    5\' 9"  (1.753 m)   Patient appears to be moderate distress patient is alert and oriented x3 Atraumatic normocephalic head No cervical or supraclavicular lymphadenopathy appreciated No increased work of breathing, no audible wheezes/rhonchi Regular sinus rhythm/rate Abdomen is soft, nontender, nondistended, severe right CVA tenderness Lower extremities are symmetric without appreciable edema Grossly neurologically intact No identifiable skin lesions  Recent Labs    07/25/21 0906 07/26/21 0847  WBC 6.1 21.9*  HGB 12.8 12.7  HCT 40.5 39.6   Recent Labs    07/25/21 0906 07/26/21 0847  NA 136 135  K 3.6 3.5  CL 105 102  CO2 25 25  GLUCOSE 103* 126*  BUN 15 18  CREATININE 0.99 1.60*  CALCIUM 9.9 10.0   No results for input(s): LABPT, INR in the last 72 hours. No results for input(s): LABURIN in the last 72  hours. Results for orders placed or performed during the hospital encounter of 07/26/21  Resp Panel by RT-PCR (Flu A&B, Covid) Nasopharyngeal Swab     Status: None   Collection Time: 07/26/21  9:08 AM   Specimen: Nasopharyngeal Swab; Nasopharyngeal(NP) swabs in vial transport medium  Result Value Ref Range  Status   SARS Coronavirus 2 by RT PCR NEGATIVE NEGATIVE Final    Comment: (NOTE) SARS-CoV-2 target nucleic acids are NOT DETECTED.  The SARS-CoV-2 RNA is generally detectable in upper respiratory specimens during the acute phase of infection. The lowest concentration of SARS-CoV-2 viral copies this assay can detect is 138 copies/mL. A negative result does not preclude SARS-Cov-2 infection and should not be used as the sole basis for treatment or other patient management decisions. A negative result may occur with  improper specimen collection/handling, submission of specimen other than nasopharyngeal swab, presence of viral mutation(s) within the areas targeted by this assay, and inadequate number of viral copies(<138 copies/mL). A negative result must be combined with clinical observations, patient history, and epidemiological information. The expected result is Negative.  Fact Sheet for Patients:  EntrepreneurPulse.com.au  Fact Sheet for Healthcare Providers:  IncredibleEmployment.be  This test is no t yet approved or cleared by the [place] FDA and  has been authorized for detection and/or diagnosis of SARS-CoV-2 by FDA under an Emergency Use Authorization (EUA). This EUA will remain  in effect (meaning this test can be used) for the duration of the COVID-19 declaration under Section 564(b)(1) of the Act, 21 U.S.C.section 360bbb-3(b)(1), unless the authorization is terminated  or revoked sooner.       Influenza A by PCR NEGATIVE NEGATIVE Final   Influenza B by PCR NEGATIVE NEGATIVE Final    Comment: (NOTE) The Xpert Xpress  SARS-CoV-2/FLU/RSV plus assay is intended as an aid in the diagnosis of influenza from Nasopharyngeal swab specimens and should not be used as a sole basis for treatment. Nasal washings and aspirates are unacceptable for Xpert Xpress SARS-CoV-2/FLU/RSV testing.  Fact Sheet for Patients: EntrepreneurPulse.com.au  Fact Sheet for Healthcare Providers: IncredibleEmployment.be  This test is not yet approved or cleared by the [place] FDA and has been authorized for detection and/or diagnosis of SARS-CoV-2 by FDA under an Emergency Use Authorization (EUA). This EUA will remain in effect (meaning this test can be used) for the duration of the COVID-19 declaration under Section 564(b)(1) of the Act, 21 U.S.C. section 360bbb-3(b)(1), unless the authorization is terminated or revoked.  Performed at [facility], [address], [place], [place] [address]     Imaging: Patient had a noncontrast CT scan performed yesterday demonstrated bifid right collecting system with hydronephrosis and thickening or inflammation of the urothelium.  There was a 6 mm stone in the right distal ureter.  She also has a large nonobstructing stone in the right upper pole.  I personally reviewed these findings and discussed them with the patient.  Imp: The patient has an infected right distal ureteral stone with septic physiology.  Recommendations: I recommended we proceed urgently to the operating room to drain the infection.  She will subsequently need broad-spectrum antibiotics until urine cultures return.  When she has been treated appropriately for her pyelonephritis she will then need more definitive treatment for her distal ureteral stone by Dr. Diona Fanti.   Ardis Hughs

## 2021-07-26 NOTE — Assessment & Plan Note (Addendum)
-   Right side. Secondary to 5-6 mm stone in distal ureter with sepsis, likely due to infected stone. -Urology consulted, underwent right ureteral stent placement on 2/7 -Patient voiding well, Foley catheter removed on 2/8 -Urology commended 2 weeks of antibiotics, subsequent stone removal and stent exchange after that -Continue oral ciprofloxacin

## 2021-07-26 NOTE — ED Notes (Signed)
Per RN, there are two ice packs placed in her pelvic region and two placed in her axillary region in attempt to help her temp drop.

## 2021-07-26 NOTE — Anesthesia Preprocedure Evaluation (Addendum)
Anesthesia Evaluation  Patient identified by MRN, date of birth, ID band Patient awake    Reviewed: Allergy & Precautions, NPO status , Patient's Chart, lab work & pertinent test results  Airway Mallampati: II  TM Distance: >3 FB Neck ROM: Full    Dental  (+) Dental Advisory Given, Partial Upper,    Pulmonary neg pulmonary ROS,    Pulmonary exam normal breath sounds clear to auscultation       Cardiovascular negative cardio ROS   Rhythm:Regular Rate:Tachycardia     Neuro/Psych  Headaches, PSYCHIATRIC DISORDERS Anxiety    GI/Hepatic Neg liver ROS, GERD  Medicated,  Endo/Other  negative endocrine ROShyperparathyroidism  Renal/GU Renal diseaseRight ureteral stone     Musculoskeletal negative musculoskeletal ROS (+)   Abdominal (+) + obese,   Peds  (+) ADHD Hematology  (+) Blood dyscrasia, anemia ,   Anesthesia Other Findings   Reproductive/Obstetrics negative OB ROS                           Anesthesia Physical Anesthesia Plan  ASA: 2  Anesthesia Plan: General   Post-op Pain Management: Minimal or no pain anticipated   Induction: Intravenous, Rapid sequence and Cricoid pressure planned  PONV Risk Score and Plan: 4 or greater and Ondansetron, Dexamethasone, Midazolam and Treatment may vary due to age or medical condition  Airway Management Planned: Oral ETT  Additional Equipment: None  Intra-op Plan:   Post-operative Plan: Extubation in OR  Informed Consent: I have reviewed the patients History and Physical, chart, labs and discussed the procedure including the risks, benefits and alternatives for the proposed anesthesia with the patient or authorized representative who has indicated his/her understanding and acceptance.     Dental advisory given  Plan Discussed with: CRNA  Anesthesia Plan Comments: (Lab Results      Component                Value               Date                       WBC                      21.9 (H)            07/26/2021                HGB                      12.7                07/26/2021                HCT                      39.6                07/26/2021                MCV                      81.0                07/26/2021                PLT  183                 07/26/2021           Lab Results      Component                Value               Date                      NA                       135                 07/26/2021                K                        3.5                 07/26/2021                CO2                      25                  07/26/2021                GLUCOSE                  126 (H)             07/26/2021                BUN                      18                  07/26/2021                CREATININE               1.60 (H)            07/26/2021                CALCIUM                  10.0                07/26/2021                GFRNONAA                 40 (L)              07/26/2021          )      Anesthesia Quick Evaluation

## 2021-07-26 NOTE — Consult Note (Signed)
46 year old female with a history of a right ureteral stone he was seen yesterday in the emergency department for renal colic.  She was treated with medical expulsion therapy.  There was concern for UTI at that time.  Over the last 24 hours she has started to develop chills, rigors, fatigue.  Her white count is gone from 6000-22,000.  She has acute kidney injury.  She has urine from yesterday that is concerning for infection.  Recommended that the patient undergo urgent stent placement.  We will try to coordinate that as quickly as possible.  I also would recommend that the patient be admitted to the medicine service for management of her pending sepsis.  Full consult to follow.

## 2021-07-26 NOTE — Progress Notes (Signed)
This patient underwent cystoscopy, right ureteral stent placement today.  The patient was tachycardic and febrile upon entering the OR.  The urine efflux from the right ureter was turbid and under significant pressure.  The stent was well positioned in the right ureter.  A Foley catheter was placed.   Recommendations: The patient should be placed on 2 weeks of antibiotics, culture specific.  She will then be taken back to the operating room by Dr. Diona Fanti for stone removal and stent exchange.  I would recommend removing the patient's Foley catheter tomorrow.

## 2021-07-26 NOTE — H&P (Addendum)
History and Physical    Patient: Misty [name] [id number]:[id number] DOB: [birth year] DOA: 07/26/2021 DOS: the patient was seen and examined on 07/26/2021 PCP: [name], MD  Patient coming from: Home  Chief Complaint:  Chief Complaint  Patient presents with   Nephrolithiasis    HPI: Misty [name] is a 46 y.o. 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 Patient presented secondary to right sided flank pain. Patient reports pain in her right flank that was achy in nature. She left her work to go to [employer] [place] on 2/6 where she had a CT scan significant for nephrolithiasis with ureteral obstruction and evidence of infection. At the time, admission was recommended, but patient declined as she did not want to miss her radiation treatment. This morning, day of admission, she had progressive weakness and decided to return to the ED at [facility] for evaluation. She has nausea without vomiting. No dysuria. No palpitations.   Review of Systems: As mentioned in the history of present illness. All other systems reviewed and are negative. Past Medical History:  Diagnosis Date   ADHD (attention deficit hyperactivity disorder)    Anemia    Breast cancer ([place]) 04/28/2021   Environmental and seasonal allergies    Frequency of urination    GAD (generalized anxiety disorder)    Ganglion cyst of wrist, left    GERD (gastroesophageal reflux disease)    History of COVID-19 06/12/2020   result in epic;  per pt mild symptoms that resolved   History of kidney stones    Hyperparathyroidism, primary ([place])    currently followed by pcp  (previously seen endocrinologist-- dr Louie Boston note in epic 08-29-2017, doctor moved away)   Tenosynovitis, de Quervain    left wrist   Wears glasses    Past Surgical History:  Procedure Laterality Date   BREAST LUMPECTOMY WITH RADIOACTIVE SEED LOCALIZATION Right 05/26/2021   Procedure: RIGHT BREAST  LUMPECTOMY WITH RADIOACTIVE SEED LOCALIZATION;  Surgeon: Erroll Luna, MD;  Location: [place];  Service: General;  Laterality: Right;   CYSTO W/ RIGHT URETEROSCOPY  2006   CYSTO/ RIGHT URETERAL STENT PLACEMENT  07-29-2009   LARGE RIGHT RENAL CALCULUS   CYSTOLITHOLAPAXY OF LARGE BLADDER CALCULI/   RIGHT URETERAL STENT EXCHANGE  04-15-2010   CYSTOSCOPY WITH STENT PLACEMENT  07/04/2012   Procedure: CYSTOSCOPY WITH STENT PLACEMENT;  Surgeon: Franchot Gallo, MD;  Location: [facility];  Service: Urology;  Laterality: Right;  rt dbl j stent placement    CYSTOSCOPY/RETROGRADE/URETEROSCOPY/STONE EXTRACTION WITH BASKET  07/25/2012   Procedure: CYSTOSCOPY/RETROGRADE/URETEROSCOPY/STONE EXTRACTION WITH BASKET;  Surgeon: Franchot Gallo, MD;  Location: [facility];  Service: Urology;  Laterality: Right;   EXTRACORPOREAL SHOCK WAVE LITHOTRIPSY  08-02-2009   RIGHT   HOLMIUM LASER APPLICATION  09/21/8183   Procedure: HOLMIUM LASER APPLICATION;  Surgeon: Franchot Gallo, MD;  Location: [facility];  Service: Urology;  Laterality: Right;   LAPAROSCOPIC TUBAL LIGATION Bilateral 01/17/2013   Procedure: LAPAROSCOPIC TUBAL LIGATION;  Surgeon: Melina Schools, MD;  Location: [facility];  Service: Gynecology;  Laterality: Bilateral;  1hr OR time    MINOR RELEASE DORSAL COMPARTMENT (DEQUERVAINS) Left 04/26/2021   Procedure: MINOR RELEASE DORSAL COMPARTMENT (DEQUERVAINS) WITH GANGLION EXCISION;  Surgeon: Renette Butters, MD;  Location: [place];  Service: Orthopedics;  Laterality: Left;   Social History:  reports that she has never smoked. She has never used smokeless tobacco.  She reports that she does not currently use alcohol. She reports that she does not use drugs.  Allergies  Allergen Reactions   Dilaudid [Hydromorphone Hcl] Shortness Of Breath and Nausea And Vomiting    Pt has takes percocet at home (see med rec).   Ceftriaxone  Sodium Other (See Comments)    Hair loss   Latex Rash    Family History  Problem Relation Age of Onset   Breast cancer Maternal Aunt 68       DCIS   Esophageal cancer Maternal Uncle    Stomach cancer Neg Hx    Colon cancer Neg Hx    Pancreatic cancer Neg Hx     Prior to Admission medications   Medication Sig Start Date End Date Taking? Authorizing Provider  ALPRAZolam Duanne Moron) 1 MG tablet Take 1 tablet by mouth 4 times daily as needed Patient taking differently: Take 1 mg by mouth 4 (four) times daily as needed for anxiety. 06/27/21  Yes   amphetamine-dextroamphetamine (ADDERALL) 20 MG tablet Take 1 tablet by mouth 3 times daily 06/27/21  Yes   montelukast (SINGULAIR) 10 MG tablet Take 1 tablet by mouth every evening Patient taking differently: Take 10 mg by mouth at bedtime. 04/18/21  Yes   SUMAtriptan (IMITREX) 50 MG tablet Take 1 tablet by mouth at onset of headache followed by 1 tablet 2 hours later if symptoms persist 06/07/21  Yes   tamsulosin (FLOMAX) 0.4 MG CAPS capsule Take 1 capsule  by mouth daily. 07/25/21  Yes Caryl Ada K, PA-C  ALPRAZolam Duanne Moron) 1 MG tablet Take 1 tablet (1 mg total) by mouth 4 (four) times daily as needed (fill 03/22/21) Patient not taking: Reported on 07/26/2021 03/22/21     amphetamine-dextroamphetamine (ADDERALL) 20 MG tablet Take 1 tablet (20 mg total) by mouth 3 (three) times daily (fill 03/25/21) Patient not taking: Reported on 07/26/2021 03/25/21     baclofen (LIORESAL) 10 MG tablet Take 1 tablet (10 mg total) by mouth 2 (two) times daily as needed Limit 1-2 treatments per week Patient not taking: Reported on 05/18/2021 11/19/20     cephALEXin (KEFLEX) 500 MG capsule Take 1 capsule by mouth 4 times daily for 10 days. 07/25/21 08/05/21  Fransico Meadow, PA-C  fluconazole (DIFLUCAN) 150 MG tablet Take 1 tablet by mouth as needed. 07/25/21   Fransico Meadow, PA-C  gabapentin (NEURONTIN) 300 MG capsule Take 1 capsule by mouth twice a day as needed for pain Patient  not taking: Reported on 05/18/2021 05/09/21     HYDROcodone-acetaminophen (NORCO) 10-325 MG tablet Take 1 tablet by mouth every 6 (six) hours as needed for severe pain. Patient not taking: Reported on 05/18/2021 04/26/21   Britt Bottom, PA-C  HYDROcodone-acetaminophen (NORCO/VICODIN) 5-325 MG tablet Take 1 tablet by mouth every 6 (six) hours as needed for moderate pain. Patient not taking: Reported on 07/26/2021 05/26/21   Erroll Luna, MD  ibuprofen (ADVIL) 800 MG tablet Take 1 tablet (800 mg total) by mouth every 8 (eight) hours as needed. Patient not taking: Reported on 07/26/2021 05/26/21   Erroll Luna, MD  meloxicam (MOBIC) 15 MG tablet Take 1 tablet (15 mg total) by mouth once a day for pain and inflammation Patient not taking: Reported on 07/26/2021 04/26/21   Britt Bottom, PA-C  nitrofurantoin, macrocrystal-monohydrate, (MACROBID) 100 MG capsule Take 1 capsule by mouth every 12 hours as directed for 7 days. Patient not taking: Reported on 07/26/2021 07/11/21     omeprazole (PRILOSEC) 40  MG capsule TAKE 1 CAPSULE BY MOUTH ONCE DAILY BEFORE BREAKFAST Patient taking differently: Take 40 mg by mouth daily. 07/06/20 07/06/21  Milus Banister, MD  ondansetron (ZOFRAN ODT) 4 MG disintegrating tablet Dissolve1 tablet (4 mg total) by mouth 2 (two) times daily as needed for nausea or vomiting. Patient not taking: Reported on 05/18/2021 04/26/21   Britt Bottom, PA-C  ondansetron (ZOFRAN) 4 MG tablet Take 1 tablet by mouth daily as needed for nausea or vomiting. 07/25/21 07/25/22  Fransico Meadow, PA-C  oxyCODONE-acetaminophen (PERCOCET) 5-325 MG tablet Take 1 tablet by mouth every 4  hours as needed for severe pain. 07/25/21 07/25/22  Fransico Meadow, PA-C  triamcinolone ointment (KENALOG) 0.1 % APPLY A THIN LAYER TO THE AFFECTED AREA(S) OF SKIN 2 TIMES PER DAY Patient not taking: Reported on 07/26/2021 04/04/21     zolpidem (AMBIEN) 5 MG tablet Take 1 tablet by mouth at bedtime as directed Patient not  taking: Reported on 07/26/2021 05/20/21     zonisamide (ZONEGRAN) 25 MG capsule Take 4 capsules (100 mg total) by mouth every evening. Patient not taking: Reported on 05/18/2021 11/19/20       Physical Exam: Vitals:   07/26/21 1329 07/26/21 1400 07/26/21 1436 07/26/21 1436  BP:  121/76  109/71  Pulse:  (!) 132    Resp:  (!) 26    Temp: 100 F (37.8 C)  99.3 F (37.4 C)   TempSrc: Oral  Oral   SpO2:  97%     General exam: Appears calm and comfortable and in no acute distress. Not well appearing. Conversant Respiratory: Clear to auscultation. Respiratory effort normal with no intercostal retractions or use of accessory muscles Cardiovascular: S1 & S2 heard, fast rate with normal rhythm. No murmurs, rubs, gallops or clicks. No LE edema Gastrointestinal: Abdomen is non-distended, soft and non-tender. No masses felt. Normal bowel sounds heard Neurologic: No focal neurological deficits Musculoskeletal: No calf tenderness Skin: No cyanosis. No new rashes Psychiatry: Alert and oriented x4. Memory intact. Mood & affect appropriate  Data Reviewed:  EKG obtained and significant for sinus tachycardia. CBC notable for WBC of 21,900 and CMP notable for a creatinine of 1.60  Assessment and Plan: * Sepsis ([place])- (present on admission) Fever, leukocytosis, tachycardia. No evidence of end-organ damage. Secondary to acute pyelonephritis. Started on Cefepime IV in the ED. No blood or urine cultures obtained on admission. -Ceftriaxone IV daily -Obtain urine/blood cultures; blood cultures obtained post-antibiotics. Attempting to obtain add-on urine culture -Trend procalcitonin  AKI (acute kidney injury) ([place]) Possibly secondary to ureteral obstruction and hydronephrosis. Patient does not look overtly dehydrated. Tachycardic with elevated blood pressure. Given LR IV fluid bolus in the ED. -Metabolic panel in AM  Acute pyelonephritis Secondary to nephrolithiasis with obstruction and  hydroureteronephrosis. Sepsis physiology. No urine culture obtained -Ciprofloxacin IV -Attempt to add-on urine culture to previous urinalysis collection  Renal lesion Left lower/interpolar renal lesion. 18 mm. Concern for possible hemorrhagic vs proteinaceous vs renal mass. Recommendation for non-emergent renal protocol MRI w/wo contrast. Will hold for now with acute infection/surgical needs   Anxiety -Continue Xanax prn  Fibroid uterus Noted on CT imaging.  Ductal carcinoma in situ (DCIS) of right breast- (present on admission) Patient follows with Dr. Lindi Adie as an outpatient. ER/PR positive. Patient with a history of right lumpectomy. She is currently receiving adjuvant radiation therapy with plan to follow with tamoxifen x5 years.  Migraine Currently with migrainous headache -Continue sumatriptan prn once able to take PO -  Decadron IV/Benadryl IV x1  Hydroureteronephrosis Right side. Secondary to 5-6 mm stone in distal ureter. Associated sepsis. Urology consulted on admission and plan urgent stent placement. -Urology recommendations    Advance Care Planning: Full Code  Consults: Urology, Dr. Louis Meckel  Family Communication: None at bedside   Author: Cordelia Poche, MD 07/26/2021 2:51 PM  For on call review [email or web address].

## 2021-07-26 NOTE — ED Notes (Addendum)
Hospitalist, Dr. Azalee Course, and Urologist Dr. Louis Meckel notified of pt's oral temp of 102.68F orally at this time despite 975mg  of rectal Tylenol. No additional recommendations at this time. Will continue to monitor.

## 2021-07-26 NOTE — ED Provider Notes (Addendum)
[facility] DEPT Provider Note   CSN: [id number] Arrival date & time: 07/26/21  5573     History  Chief Complaint  Patient presents with   Nephrolithiasis    Misty [name] is a 46 y.o. female.  Patient c/o right flank pain for the past 2-3 days. Symptoms acute onset, dull, moderate, right abd, non radiating. Was seen in ED yesterday, dx with uti and right ureteral stone - admission was discussed but ultimately pt opted for d/c, as wanted to f/u with radiation tx today - pt returns to ED indicating weak, right flank pain, chills, and that overall she felt worse this AM.  No dysuria. States last week had completed course of antibiotic for urinary symptoms/dysuria - those symptoms had improved on abx therapy then.   The history is provided by the patient and medical records.      Home Medications Prior to Admission medications   Medication Sig Start Date End Date Taking? Authorizing Provider  ALPRAZolam Duanne Moron) 1 MG tablet Take 1 tablet (1 mg total) by mouth 4 (four) times daily as needed (fill 03/22/21) 03/22/21     ALPRAZolam (XANAX) 1 MG tablet Take 1 tablet by mouth 4 times daily as needed 06/27/21     amphetamine-dextroamphetamine (ADDERALL) 20 MG tablet Take 1 tablet (20 mg total) by mouth 3 (three) times daily (fill 03/25/21) Patient taking differently: Take 20 mg by mouth 3 (three) times daily. 03/25/21     amphetamine-dextroamphetamine (ADDERALL) 20 MG tablet Take 1 tablet by mouth 3 times daily 06/27/21     Ascorbic Acid (VITAMIN C PO) Take by mouth daily. Patient not taking: Reported on 05/18/2021    [provider]  baclofen (LIORESAL) 10 MG tablet Take 1 tablet (10 mg total) by mouth 2 (two) times daily as needed Limit 1-2 treatments per week Patient not taking: Reported on 05/18/2021 11/19/20     cephALEXin (KEFLEX) 500 MG capsule Take 1 capsule by mouth 4 times daily for 10 days. 07/25/21 08/05/21  Fransico Meadow, PA-C  Cholecalciferol  (VITAMIN D3 PO) Take by mouth daily. Patient not taking: Reported on 05/18/2021    [provider]  fluconazole (DIFLUCAN) 150 MG tablet Take 1 tablet by mouth as needed. 07/25/21   Fransico Meadow, PA-C  gabapentin (NEURONTIN) 300 MG capsule Take 1 capsule by mouth twice a day as needed for pain Patient not taking: Reported on 05/18/2021 05/09/21     HYDROcodone-acetaminophen (NORCO) 10-325 MG tablet Take 1 tablet by mouth every 6 (six) hours as needed for severe pain. Patient not taking: Reported on 05/18/2021 04/26/21   Britt Bottom, PA-C  HYDROcodone-acetaminophen (NORCO/VICODIN) 5-325 MG tablet Take 1 tablet by mouth every 6 (six) hours as needed for moderate pain. 05/26/21   Cornett, Marcello Moores, MD  ibuprofen (ADVIL) 800 MG tablet Take 1 tablet (800 mg total) by mouth every 8 (eight) hours as needed. 05/26/21   Cornett, Marcello Moores, MD  meloxicam (MOBIC) 15 MG tablet Take 1 tablet (15 mg total) by mouth once a day for pain and inflammation 04/26/21   Aggie Moats M, PA-C  montelukast (SINGULAIR) 10 MG tablet Take 1 tablet by mouth every evening Patient taking differently: Take 10 mg by mouth at bedtime. 04/18/21     nitrofurantoin, macrocrystal-monohydrate, (MACROBID) 100 MG capsule Take 1 capsule by mouth every 12 hours as directed for 7 days. 07/11/21     omeprazole (PRILOSEC) 40 MG capsule TAKE 1 CAPSULE BY MOUTH ONCE DAILY BEFORE BREAKFAST  Patient taking differently: Take 40 mg by mouth daily. 07/06/20 07/06/21  Milus Banister, MD  ondansetron (ZOFRAN ODT) 4 MG disintegrating tablet Dissolve1 tablet (4 mg total) by mouth 2 (two) times daily as needed for nausea or vomiting. Patient not taking: Reported on 05/18/2021 04/26/21   Britt Bottom, PA-C  ondansetron (ZOFRAN) 4 MG tablet Take 1 tablet by mouth daily as needed for nausea or vomiting. 07/25/21 07/25/22  Fransico Meadow, PA-C  oxyCODONE-acetaminophen (PERCOCET) 5-325 MG tablet Take 1 tablet by mouth every 4  hours as needed for severe  pain. 07/25/21 07/25/22  Fransico Meadow, PA-C  SUMAtriptan (IMITREX) 50 MG tablet 1 tablet at onset of headache followed by 1 tablet 2 hours later if symptoms persist    [provider]  SUMAtriptan (IMITREX) 50 MG tablet Take 1 tablet by mouth at onset of headache followed by 1 tablet 2 hours later if symptoms persist 06/07/21     tamsulosin (FLOMAX) 0.4 MG CAPS capsule Take 1 capsule  by mouth daily. 07/25/21   Fransico Meadow, PA-C  triamcinolone ointment (KENALOG) 0.1 % APPLY A THIN LAYER TO THE AFFECTED AREA(S) OF SKIN 2 TIMES PER DAY 04/04/21     zolpidem (AMBIEN) 5 MG tablet Take 1 tablet by mouth at bedtime as directed 05/20/21     zonisamide (ZONEGRAN) 25 MG capsule Take 4 capsules (100 mg total) by mouth every evening. Patient not taking: Reported on 05/18/2021 11/19/20         Allergies    Dilaudid [hydromorphone hcl], Ceftriaxone sodium, and Latex    Review of Systems   Review of Systems  Constitutional:  Positive for chills.  HENT:  Negative for sore throat.   Eyes:  Negative for redness.  Respiratory:  Negative for cough and shortness of breath.   Cardiovascular:  Negative for chest pain.  Gastrointestinal:  Positive for abdominal pain and nausea. Negative for vomiting.  Genitourinary:  Positive for flank pain. Negative for dysuria.  Musculoskeletal:  Negative for neck pain.  Skin:  Negative for rash.  Neurological:  Negative for headaches.  Hematological:  Does not bruise/bleed easily.  Psychiatric/Behavioral:  Negative for confusion.    Physical Exam Updated Vital Signs BP 135/90 (BP Location: Right Arm)    Pulse (!) 113    Temp 97.8 F (36.6 C) (Oral)    Resp 16    LMP 07/15/2021 Comment: neg upreg in er today.   SpO2 100%  Physical Exam Vitals and nursing note reviewed.  Constitutional:      Appearance: Normal appearance. She is well-developed.  HENT:     Head: Atraumatic.     Nose: Nose normal.     Mouth/Throat:     Mouth: Mucous membranes are moist.   Eyes:     General: No scleral icterus.    Conjunctiva/sclera: Conjunctivae normal.  Neck:     Trachea: No tracheal deviation.  Cardiovascular:     Rate and Rhythm: Normal rate and regular rhythm.     Pulses: Normal pulses.     Heart sounds: Normal heart sounds. No murmur heard.   No friction rub. No gallop.  Pulmonary:     Effort: Pulmonary effort is normal. No respiratory distress.     Breath sounds: Normal breath sounds.  Abdominal:     General: Bowel sounds are normal. There is no distension.     Palpations: Abdomen is soft.     Tenderness: There is no abdominal tenderness.  Genitourinary:  Comments: No cva tenderness.  Musculoskeletal:        General: No swelling.     Cervical back: Normal range of motion and neck supple. No rigidity. No muscular tenderness.  Skin:    General: Skin is warm and dry.     Findings: No rash.  Neurological:     Mental Status: She is alert.     Comments: Alert, speech normal.   Psychiatric:        Mood and Affect: Mood normal.    ED Results / Procedures / Treatments   Labs (all labs ordered are listed, but only abnormal results are displayed) Results for orders placed or performed during the hospital encounter of 07/26/21  CBC  Result Value Ref Range   WBC 21.9 (H) 4.0 - 10.5 K/uL   RBC 4.89 3.87 - 5.11 MIL/uL   Hemoglobin 12.7 12.0 - 15.0 g/dL   HCT 39.6 36.0 - 46.0 %   MCV 81.0 80.0 - 100.0 fL   MCH 26.0 26.0 - 34.0 pg   MCHC 32.1 30.0 - 36.0 g/dL   RDW 16.2 (H) 11.5 - 15.5 %   Platelets 183 150 - 400 K/uL   nRBC 0.0 0.0 - 0.2 %  Comprehensive metabolic panel  Result Value Ref Range   Sodium 135 135 - 145 mmol/L   Potassium 3.5 3.5 - 5.1 mmol/L   Chloride 102 98 - 111 mmol/L   CO2 25 22 - 32 mmol/L   Glucose, Bld 126 (H) 70 - 99 mg/dL   BUN 18 6 - 20 mg/dL   Creatinine, Ser 1.60 (H) 0.44 - 1.00 mg/dL   Calcium 10.0 8.9 - 10.3 mg/dL   Total Protein 7.0 6.5 - 8.1 g/dL   Albumin 3.5 3.5 - 5.0 g/dL   AST 32 15 - 41 U/L    ALT 29 0 - 44 U/L   Alkaline Phosphatase 57 38 - 126 U/L   Total Bilirubin 1.3 (H) 0.3 - 1.2 mg/dL   GFR, Estimated 40 (L) >60 mL/min   Anion gap 8 5 - 15   CT ABDOMEN PELVIS W CONTRAST  Result Date: 07/25/2021 CLINICAL DATA:  Right lower quadrant pain radiating to right flank since last night. EXAM: CT ABDOMEN AND PELVIS WITH CONTRAST TECHNIQUE: Multidetector CT imaging of the abdomen and pelvis was performed using the standard protocol following bolus administration of intravenous contrast. RADIATION DOSE REDUCTION: This exam was performed according to the departmental dose-optimization program which includes automated exposure control, adjustment of the mA and/or kV according to patient size and/or use of iterative reconstruction technique. CONTRAST:  156mL OMNIPAQUE IOHEXOL 300 MG/ML  SOLN COMPARISON:  April 29, 2021 FINDINGS: Lower chest: Surgical clips in the paramedian right breast. No acute abnormality. Hepatobiliary: No suspicious hepatic lesion. Gallbladder is unremarkable. No biliary ductal dilation. Pancreas: No pancreatic ductal dilation or evidence of acute inflammation. Spleen: Normal in size without focal abnormality. Adrenals/Urinary Tract: Nodular thickening of the medial limb of the left adrenal gland is unchanged dating back to October 09, 2016 consistent with a benign finding. Right adrenal gland is normal. Edematous right kidney with hydroureteronephrosis to the level of a 5-6 mm stone in the distal right ureter on image 67/2, with urothelial hyperenhancement and perinephric/periureteral stranding. Additional nonobstructive right renal stones measuring up to 10 mm. Nonobstructive punctate left upper pole renal stone. No left-sided hydronephrosis. Left lower/interpolar renal lesion measures 18 mm on image 37/2 demonstrating Hounsfield units of 55, greater than that expected for a simple cyst.  3.9 cm left interpolar renal cyst. Urinary bladder is unremarkable for degree of distension.  Stomach/Bowel: Radiopaque enteric contrast material traverses distal loops of small bowel. Stomach is unremarkable for degree of distension. No pathologic dilation of small or large bowel. The appendix and terminal ileum appear normal. No evidence of acute bowel inflammation. Vascular/Lymphatic: No abdominal aortic aneurysm. No pathologically enlarged abdominal or pelvic lymph nodes. Reproductive: Enlarged leiomyomatous uterus appears similar prior. Nodular hyperdense focus measuring 14 mm in the endometrium at the uterine fundus. No suspicious adnexal mass. Other: Trace pelvic free fluid is within physiologic normal limits. Musculoskeletal: Multilevel degenerative changes spine. No acute osseous abnormality. IMPRESSION: 1. Edematous right kidney with urothelial hyperenhancement and hydroureteronephrosis to the level of a 5-6 mm stone in the distal right ureter, recommend correlation with laboratory values to exclude superimposed infection. 2. Additional nonobstructive bilateral renal stones measuring up to 10 mm. 3. Left lower/interpolar renal lesion measures 18 mm with Hounsfield units greater than that expected for a simple cyst, possibly reflecting a hemorrhagic/proteinaceous cyst but enhancing renal mass not excluded and is incompletely evaluated for on this single-phase study. Recommend further/more definitive characterization with nonemergent renal protocol MRI with and without contrast. 4. Enlarged leiomyomatous uterus. Nodular hyperdense focus measuring 14 mm in the endometrium at the uterine fundus, incompletely evaluated on this study and possibly representing a submucosal fibroid or intrauterine blood products, consider further evaluation with dedicated pelvic ultrasound. Electronically Signed   By: Dahlia Bailiff M.D.   On: 07/25/2021 11:45    ED ECG REPORT   Date: 07/26/2021  Rate: 143  Rhythm: sinus tachycardia  QRS Axis: normal  Intervals: normal  ST/T Wave abnormalities: nonspecific ST/T  changes  Conduction Disutrbances:none  Narrative Interpretation:   Old EKG Reviewed: changes noted  I have personally reviewed the EKG tracing    Radiology CT ABDOMEN PELVIS W CONTRAST  Result Date: 07/25/2021 CLINICAL DATA:  Right lower quadrant pain radiating to right flank since last night. EXAM: CT ABDOMEN AND PELVIS WITH CONTRAST TECHNIQUE: Multidetector CT imaging of the abdomen and pelvis was performed using the standard protocol following bolus administration of intravenous contrast. RADIATION DOSE REDUCTION: This exam was performed according to the departmental dose-optimization program which includes automated exposure control, adjustment of the mA and/or kV according to patient size and/or use of iterative reconstruction technique. CONTRAST:  154mL OMNIPAQUE IOHEXOL 300 MG/ML  SOLN COMPARISON:  April 29, 2021 FINDINGS: Lower chest: Surgical clips in the paramedian right breast. No acute abnormality. Hepatobiliary: No suspicious hepatic lesion. Gallbladder is unremarkable. No biliary ductal dilation. Pancreas: No pancreatic ductal dilation or evidence of acute inflammation. Spleen: Normal in size without focal abnormality. Adrenals/Urinary Tract: Nodular thickening of the medial limb of the left adrenal gland is unchanged dating back to October 09, 2016 consistent with a benign finding. Right adrenal gland is normal. Edematous right kidney with hydroureteronephrosis to the level of a 5-6 mm stone in the distal right ureter on image 67/2, with urothelial hyperenhancement and perinephric/periureteral stranding. Additional nonobstructive right renal stones measuring up to 10 mm. Nonobstructive punctate left upper pole renal stone. No left-sided hydronephrosis. Left lower/interpolar renal lesion measures 18 mm on image 37/2 demonstrating Hounsfield units of 55, greater than that expected for a simple cyst. 3.9 cm left interpolar renal cyst. Urinary bladder is unremarkable for degree of distension.  Stomach/Bowel: Radiopaque enteric contrast material traverses distal loops of small bowel. Stomach is unremarkable for degree of distension. No pathologic dilation of small or large bowel. The appendix  and terminal ileum appear normal. No evidence of acute bowel inflammation. Vascular/Lymphatic: No abdominal aortic aneurysm. No pathologically enlarged abdominal or pelvic lymph nodes. Reproductive: Enlarged leiomyomatous uterus appears similar prior. Nodular hyperdense focus measuring 14 mm in the endometrium at the uterine fundus. No suspicious adnexal mass. Other: Trace pelvic free fluid is within physiologic normal limits. Musculoskeletal: Multilevel degenerative changes spine. No acute osseous abnormality. IMPRESSION: 1. Edematous right kidney with urothelial hyperenhancement and hydroureteronephrosis to the level of a 5-6 mm stone in the distal right ureter, recommend correlation with laboratory values to exclude superimposed infection. 2. Additional nonobstructive bilateral renal stones measuring up to 10 mm. 3. Left lower/interpolar renal lesion measures 18 mm with Hounsfield units greater than that expected for a simple cyst, possibly reflecting a hemorrhagic/proteinaceous cyst but enhancing renal mass not excluded and is incompletely evaluated for on this single-phase study. Recommend further/more definitive characterization with nonemergent renal protocol MRI with and without contrast. 4. Enlarged leiomyomatous uterus. Nodular hyperdense focus measuring 14 mm in the endometrium at the uterine fundus, incompletely evaluated on this study and possibly representing a submucosal fibroid or intrauterine blood products, consider further evaluation with dedicated pelvic ultrasound. Electronically Signed   By: Dahlia Bailiff M.D.   On: 07/25/2021 11:45    Procedures Procedures    Medications Ordered in ED Medications  sodium chloride 0.9 % bolus 1,000 mL (has no administration in time range)  morphine (PF)  4 MG/ML injection 4 mg (has no administration in time range)  ondansetron (ZOFRAN) injection 4 mg (has no administration in time range)    ED Course/ Medical Decision Making/ A&P                           Medical Decision Making Problems Addressed: Acute UTI: acute illness or injury with systemic symptoms that poses a threat to life or bodily functions AKI (acute kidney injury) ([place]): acute illness or injury with systemic symptoms that poses a threat to life or bodily functions Leukocytosis, unspecified type: acute illness or injury Right ureteral stone: acute illness or injury with systemic symptoms  Amount and/or Complexity of Data Reviewed External Data Reviewed: labs, radiology and notes. Labs: ordered. Decision-making details documented in ED Course. Radiology: independent interpretation performed. Decision-making details documented in ED Course. Discussion of management or test interpretation with external provider(s): Consulted, discussed w urology and hospitalists.   Risk Prescription drug management. Parenteral controlled substances. Drug therapy requiring intensive monitoring for toxicity. Decision regarding hospitalization. Risk Details: Emergent urologic surgery/stent planned.    Iv ns bolus. Morphine iv. Zofran iv. Labs sent.   Reviewed nursing notes and prior charts for additional history. External reported reviewed. Recent ct reviewed/interpreted by me - right ureteral stone w hydro.   Labs reviewed/interpreted by me - wbc v high. Aki. Aztreoenam iv (?allergy to rocephin).  Additional ivf.   Cardiac monitor - sinus rhythm/sinus tach, rate 110.   Urology consulted - discussed pt, yesterdays ED visit, labs, ct, todays labs, etc.  - he indicates keep NPO, he will plan on OR today for stent, admit to medicine.   Hospitalists consulted for admission.  Discussed pt with hospitalist, Dr Teryl Lucy - will admit.   CRITICAL CARE  RE: acute uti/pyelo with ureteral stone,  aki/dehydration, leucocytosis, r/o sepsis, emergent urologic consultation and surgical procedure Performed by: Mirna Mires Total critical care time: 40 minutes Critical care time was exclusive of separately billable procedures and treating other patients. Critical care was  necessary to treat or prevent imminent or life-threatening deterioration. Critical care was time spent personally by me on the following activities: development of treatment plan with patient and/or surrogate as well as nursing, discussions with consultants, evaluation of patient's response to treatment, examination of patient, obtaining history from patient or surrogate, ordering and performing treatments and interventions, ordering and review of laboratory studies, ordering and review of radiographic studies, pulse oximetry and re-evaluation of patient's condition.          Final Clinical Impression(s) / ED Diagnoses Final diagnoses:  None    Rx / DC Orders ED Discharge Orders     None          Lajean Saver, MD 07/26/21 1102

## 2021-07-26 NOTE — ED Notes (Signed)
EDP re-paged, regarding pt's status, notified and aware receiving second liter of IVF, has received the cefepime at this time, remains febrile at 100.17F and is tachy at 145bpm, still breathing around 30x/min. BP WNL. Repeat EKG has been captured and provided to EDP. No additional orders at this time, will continue to monitor.

## 2021-07-26 NOTE — Anesthesia Procedure Notes (Signed)
Procedure Name: Intubation Date/Time: 07/26/2021 4:01 PM Performed by: Cynda Familia, CRNA Pre-anesthesia Checklist: Patient identified, Emergency Drugs available, Suction available and Patient being monitored Patient Re-evaluated:Patient Re-evaluated prior to induction Oxygen Delivery Method: Circle System Utilized Preoxygenation: Pre-oxygenation with 100% oxygen Induction Type: IV induction Ventilation: Mask ventilation without difficulty Laryngoscope Size: Miller and 2 Grade View: Grade I Tube type: Oral Number of attempts: 1 Airway Equipment and Method: Stylet Placement Confirmation: ETT inserted through vocal cords under direct vision, positive ETCO2 and breath sounds checked- equal and bilateral Secured at: 21 cm Tube secured with: Tape Dental Injury: Teeth and Oropharynx as per pre-operative assessment  Comments: IV induction [place]- intubation AM CRNA atraumatic-- teeth and mouth as preop bilat BS

## 2021-07-26 NOTE — ED Notes (Signed)
EDP, Dr. Ashok Cordia, notified and aware pt requesting medication for headache and returning pain at this time.No additional orders at this time. Will continue to monitor.

## 2021-07-26 NOTE — ED Notes (Signed)
Pt. Aware of urine specimen. Will collect urine specimen when pt. Voids. Nurse aware.

## 2021-07-26 NOTE — ED Notes (Addendum)
Discussed with both Hospitalist, Dr. Lonny Prude, and Urologist Dr. Louis Meckel, pt's repeat oral temp of 100.58F. Per Dr. Lonny Prude, Hospitalist, holding the recommended 15mg  Toradol IV, suggested by Dr. Louis Meckel to address pt's fever in addition to rectal Tylenol she has already received. Holding 15mg  Toradol IV at this time. Dr. Louis Meckel in agreement.

## 2021-07-26 NOTE — Assessment & Plan Note (Addendum)
-   Presented with fever, leukocytosis, tachycardia, source likely due to acute pyelonephritis and right infected ureteral stone.   -Patient was placed on IV cefepime in ED, unfortunately blood cultures, urine culture obtained post antibiotics in ED. -Cultures negative so far, appreciate ID recommendations, -Sepsis physiology resolved, procalcitonin improving 39.76. -Leukocytosis due to dexamethasone received on 2/7

## 2021-07-26 NOTE — Assessment & Plan Note (Addendum)
-   Secondary to right infected ureteral stone -Cultures negative so far.  ID was consulted. - Transition to oral ciprofloxacin, recommended 2 weeks of antibiotics from the surgery -Status post right ureteral stent placement on 2/7

## 2021-07-26 NOTE — Assessment & Plan Note (Addendum)
Noted on CT imaging.  Outpatient follow-up

## 2021-07-26 NOTE — Assessment & Plan Note (Addendum)
-  Continue Xanax prn, currently stable

## 2021-07-27 ENCOUNTER — Encounter (HOSPITAL_COMMUNITY): Payer: Self-pay | Admitting: Urology

## 2021-07-27 ENCOUNTER — Ambulatory Visit
Admission: RE | Admit: 2021-07-27 | Discharge: 2021-07-27 | Disposition: A | Discharge status: Home / Self Care | Payer: 59 | Source: Ambulatory Visit | Attending: Radiation Oncology | Admitting: Radiation Oncology

## 2021-07-27 DIAGNOSIS — N390 Urinary tract infection, site not specified: Secondary | ICD-10-CM | POA: Diagnosis not present

## 2021-07-27 DIAGNOSIS — F419 Anxiety disorder, unspecified: Secondary | ICD-10-CM | POA: Diagnosis not present

## 2021-07-27 DIAGNOSIS — N179 Acute kidney failure, unspecified: Secondary | ICD-10-CM | POA: Diagnosis not present

## 2021-07-27 DIAGNOSIS — N10 Acute tubulo-interstitial nephritis: Secondary | ICD-10-CM | POA: Diagnosis not present

## 2021-07-27 LAB — COMPREHENSIVE METABOLIC PANEL
ALT: 24 U/L (ref 0–44)
AST: 25 U/L (ref 15–41)
Albumin: 2.8 g/dL — ABNORMAL LOW (ref 3.5–5.0)
Alkaline Phosphatase: 49 U/L (ref 38–126)
Anion gap: 7 (ref 5–15)
BUN: 20 mg/dL (ref 6–20)
CO2: 21 mmol/L — ABNORMAL LOW (ref 22–32)
Calcium: 9.7 mg/dL (ref 8.9–10.3)
Chloride: 107 mmol/L (ref 98–111)
Creatinine, Ser: 1.25 mg/dL — ABNORMAL HIGH (ref 0.44–1.00)
GFR, Estimated: 53 mL/min — ABNORMAL LOW (ref >60)
Glucose, Bld: 159 mg/dL — ABNORMAL HIGH (ref 70–99)
Potassium: 3.6 mmol/L (ref 3.5–5.1)
Sodium: 135 mmol/L (ref 135–145)
Total Bilirubin: 1.4 mg/dL — ABNORMAL HIGH (ref 0.3–1.2)
Total Protein: 5.9 g/dL — ABNORMAL LOW (ref 6.5–8.1)

## 2021-07-27 LAB — CBC
HCT: 32.1 % — ABNORMAL LOW (ref 36.0–46.0)
Hemoglobin: 10.5 g/dL — ABNORMAL LOW (ref 12.0–15.0)
MCH: 26.2 pg (ref 26.0–34.0)
MCHC: 32.7 g/dL (ref 30.0–36.0)
MCV: 80 fL (ref 80.0–100.0)
Platelets: 100 10*3/uL — ABNORMAL LOW (ref 150–400)
RBC: 4.01 MIL/uL (ref 3.87–5.11)
RDW: 16.4 % — ABNORMAL HIGH (ref 11.5–15.5)
WBC: 24 10*3/uL — ABNORMAL HIGH (ref 4.0–10.5)
nRBC: 0 % (ref 0.0–0.2)

## 2021-07-27 LAB — PROCALCITONIN: Procalcitonin: 66.83 ng/mL

## 2021-07-27 LAB — HIV ANTIBODY (ROUTINE TESTING W REFLEX): HIV Screen 4th Generation wRfx: NONREACTIVE

## 2021-07-27 MED ORDER — CHLORHEXIDINE GLUCONATE CLOTH 2 % EX PADS: 6.0000 | MEDICATED_PAD | Freq: Every day | CUTANEOUS | Status: DC

## 2021-07-27 NOTE — Progress Notes (Signed)
[facility]                                                                               Misty [name], is a 46 y.o. female, DOB - October 05, 1975, DUK:[id number] Admit date - 07/26/2021    Outpatient Primary MD for the patient is [name], MD  LOS - 1  days    Brief summary   Misty [name] is a 46 y.o. 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 Patient presented secondary to right sided flank pain. Patient reports pain in her right flank that was achy in nature. She left her work to go to [employer] [place] on 2/6 where she had a CT scan significant for nephrolithiasis with ureteral obstruction and evidence of infection. At the time, admission was recommended, but patient declined as she did not want to miss her radiation treatment. This morning, day of admission, she had progressive weakness and decided to return to the ED at [facility] for evaluation. She has nausea without vomiting. No dysuria. No palpitations.   Assessment & Plan    Assessment and Plan: * Sepsis ([place])- (present on admission) - Presented with fever, leukocytosis, tachycardia, source likely due to acute pyelonephritis and right infected ureteral stone.   -Patient was placed on IV cefepime in ED, unfortunately blood cultures, urine culture obtained post antibiotics in ED. -Continue IV ciprofloxacin, follow cultures.  Hydroureteronephrosis - Right side. Secondary to 5-6 mm stone in distal ureter with sepsis, likely due to infected stone. -Urology consulted, underwent right ureteral stent placement on 2/7 -Foley catheter removed today.  -Urology commended 2 weeks of antibiotics, subsequent stone removal and stent exchange after that -Continue IV antibiotics   Acute pyelonephritis - Secondary to right infected ureteral stone -Continue IV ciprofloxacin, follow blood cultures, urine cultures. -Status post right ureteral stent  placement  AKI (acute kidney injury) ([place]) - Secondary to sepsis, hydronephrosis, ureteral obstruction due to infected stone.   -Presented with creatinine of 1.6 on admission, improving to 1.2    Migraine - Improved   Renal lesion -Left lower/interpolar renal lesion. 18 mm. Concern for possible hemorrhagic vs proteinaceous vs renal mass. Recommendation for non-emergent renal protocol MRI w/wo contrast.  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 Lindi Adie as an outpatient. ER/PR positive. Patient with a history of right lumpectomy. - currently receiving adjuvant radiation therapy with plan to follow with tamoxifen x5 years.  Anxiety -Continue Xanax prn, currently stable  Fibroid uterus Noted on CT imaging.  Outpatient follow-up  Obesity (BMI 30-39.9)- (present on admission) - Encourage lifestyle changes  Estimated body mass index is 31.45 kg/m as calculated from the following:   Height as of this encounter: 5\' 9"  (1.753 m).   Weight as of this encounter: 96.6 kg.  Code Status: Full code DVT Prophylaxis:  enoxaparin (LOVENOX) injection 40 mg Start: 07/27/21 1000 Place and maintain sequential compression device Start: 07/26/21 1843   Level of Care: Level of care: Progressive Family Communication: Updated  patient's daughter at bedside   Disposition Plan:     Remains inpatient appropriate: Leukocytosis 25.0, procalcitonin still high, not medically appropriate for discharge.  Awaiting cultures  Procedures:  Cystoscopy, Right ureteral stent placement  Consultants:   urology  Antimicrobials:   Anti-infectives (From admission, onward)    Start     Dose/Rate Route Frequency Ordered Stop   07/26/21 1530  ciprofloxacin (CIPRO) 400 MG/200ML IVPB       Note to Pharmacy: Georgena Spurling W: cabinet override      07/26/21 1530  07/26/21 1619   07/26/21 1515  ciprofloxacin (CIPRO) IVPB 400 mg        400 mg 200 mL/hr over 60 Minutes Intravenous Every 12 hours 07/26/21 1505     07/26/21 0945  ceFEPIme (MAXIPIME) 2 g in sodium chloride 0.9 % 100 mL IVPB        2 g 200 mL/hr over 30 Minutes Intravenous  Once 07/26/21 0941 07/26/21 1028   07/26/21 0930  aztreonam (AZACTAM) 1 g in sodium chloride 0.9 % 100 mL IVPB  Status:  Discontinued        1 g 200 mL/hr over 30 Minutes Intravenous  Once 07/26/21 0919 07/26/21 0941        Medications  Scheduled Meds:  enoxaparin (LOVENOX) injection  40 mg Subcutaneous Q24H   montelukast  10 mg Oral QHS   pantoprazole  40 mg Oral Daily   Continuous Infusions:  acetaminophen 1,000 mg (07/27/21 1204)   ciprofloxacin 400 mg (07/27/21 0219)   lactated ringers 100 mL/hr at 07/26/21 1900   PRN Meds:.acetaminophen **OR** acetaminophen, ALPRAZolam, hydrALAZINE, HYDROmorphone (DILAUDID) injection, morphine injection, ondansetron **OR** ondansetron (ZOFRAN) IV, SUMAtriptan, traMADol    Subjective:   Misty [name] was seen and examined today.  Still has right-sided flank pain.  Foley cath was removed today. No dizziness, chest pain, shortness of breath,  new weakness, numbess, tingling. No fevers  Objective:   Vitals:   07/26/21 2247 07/27/21 0241 07/27/21 0536 07/27/21 1302  BP: 101/68 (!) 98/55 100/71 116/80  Pulse: 96 82 70 66  Resp: 20 20 17 19   Temp: 98.8 F (37.1 C) 97.9 F (36.6 C) 97.6 F (36.4 C) 97.9 F (36.6 C)  TempSrc: Oral Oral Oral Oral  SpO2: 96% 94% 98% 97%  Weight:      Height:        Intake/Output Summary (Last 24 hours) at 07/27/2021 1325 Last data filed at 07/27/2021 0830 Gross per 24 hour  Intake 3098.63 ml  Output 2050 ml  Net 1048.63 ml   Filed Weights   07/26/21 1516  Weight: 96.6 kg     Exam General: Alert and oriented x 3, NAD Cardiovascular: S1 S2 auscultated, no murmurs, RRR Respiratory: Clear to auscultation bilaterally, no  wheezing, Gastrointestinal: Soft, non distended, + bowel sounds, NT Ext: no pedal edema bilaterally Neuro: no new FNDs   Data Reviewed:  I have personally reviewed following labs and imaging studies   CBC Lab Results  Component Value Date   WBC 24.0 (H) 07/27/2021   RBC 4.01 07/27/2021   HGB 10.5 (L) 07/27/2021   HCT 32.1 (L) 07/27/2021   MCV 80.0 07/27/2021   MCH 26.2 07/27/2021   PLT 100 (L) 07/27/2021   MCHC 32.7 07/27/2021   RDW 16.4 (H) 07/27/2021   LYMPHSABS 0.7 07/25/2021   MONOABS 0.4 07/25/2021   EOSABS 0.1 07/25/2021   BASOSABS 0.0 16/03/9603     Last metabolic panel Lab Results  Component Value Date  NA 135 07/27/2021   K 3.6 07/27/2021   CL 107 07/27/2021   CO2 21 (L) 07/27/2021   BUN 20 07/27/2021   CREATININE 1.25 (H) 07/27/2021   GLUCOSE 159 (H) 07/27/2021   GFRNONAA 53 (L) 07/27/2021   GFRAA >60 10/18/2017   CALCIUM 9.7 07/27/2021   PROT 5.9 (L) 07/27/2021   ALBUMIN 2.8 (L) 07/27/2021   BILITOT 1.4 (H) 07/27/2021   ALKPHOS 49 07/27/2021   AST 25 07/27/2021   ALT 24 07/27/2021   ANIONGAP 7 07/27/2021      Myshawn Chiriboga M.D. [facility] 07/27/2021, 1:25 PM  Available via Epic secure chat 7am-7pm After 7 pm, please refer to night coverage provider listed on amion.

## 2021-07-27 NOTE — TOC Progression Note (Addendum)
Transition of Care [facility]) - Progression Note    Patient Details  Name: ELEXIA FRIEDT MRN: [id number] Date of Birth: [birth year]  Transition of Care [facility]) CM/SW Contact  Purcell Mouton, RN Phone Number: 07/27/2021, 1:55 PM  Clinical Narrative:      Transition of Care (TOC) Screening Note   Patient Details  Name: LUNA AUDIA Date of Birth: 09-18-1975   Transition of Care [facility]) CM/SW Contact:    Purcell Mouton, RN Phone Number: 07/27/2021, 1:56 PM    Transition of Care Department [facility]) has reviewed patient and no TOC needs have been identified at this time. We will continue to monitor patient advancement through interdisciplinary progression rounds. If new patient transition needs arise, please place a TOC consult.         Expected Discharge Plan and Services                                                 Social Determinants of Health (SDOH) Interventions    Readmission Risk Interventions No flowsheet data found.

## 2021-07-27 NOTE — Assessment & Plan Note (Signed)
-   Encourage lifestyle changes

## 2021-07-28 ENCOUNTER — Ambulatory Visit
Admission: RE | Admit: 2021-07-28 | Discharge: 2021-07-28 | Disposition: A | Discharge status: Home / Self Care | Payer: 59 | Source: Ambulatory Visit | Attending: Radiation Oncology | Admitting: Radiation Oncology

## 2021-07-28 ENCOUNTER — Other Ambulatory Visit: Payer: Self-pay

## 2021-07-28 DIAGNOSIS — F909 Attention-deficit hyperactivity disorder, unspecified type: Secondary | ICD-10-CM | POA: Diagnosis present

## 2021-07-28 DIAGNOSIS — Z87442 Personal history of urinary calculi: Secondary | ICD-10-CM | POA: Diagnosis not present

## 2021-07-28 DIAGNOSIS — Z8616 Personal history of COVID-19: Secondary | ICD-10-CM | POA: Diagnosis not present

## 2021-07-28 DIAGNOSIS — A419 Sepsis, unspecified organism: Principal | ICD-10-CM

## 2021-07-28 DIAGNOSIS — K219 Gastro-esophageal reflux disease without esophagitis: Secondary | ICD-10-CM | POA: Diagnosis present

## 2021-07-28 DIAGNOSIS — N136 Pyonephrosis: Secondary | ICD-10-CM | POA: Diagnosis present

## 2021-07-28 DIAGNOSIS — N202 Calculus of kidney with calculus of ureter: Secondary | ICD-10-CM | POA: Diagnosis present

## 2021-07-28 DIAGNOSIS — Z20822 Contact with and (suspected) exposure to covid-19: Secondary | ICD-10-CM | POA: Diagnosis present

## 2021-07-28 DIAGNOSIS — N179 Acute kidney failure, unspecified: Secondary | ICD-10-CM | POA: Diagnosis not present

## 2021-07-28 DIAGNOSIS — Z881 Allergy status to other antibiotic agents status: Secondary | ICD-10-CM | POA: Diagnosis not present

## 2021-07-28 DIAGNOSIS — Z885 Allergy status to narcotic agent status: Secondary | ICD-10-CM | POA: Diagnosis not present

## 2021-07-28 DIAGNOSIS — D0511 Intraductal carcinoma in situ of right breast: Secondary | ICD-10-CM | POA: Diagnosis present

## 2021-07-28 DIAGNOSIS — N12 Tubulo-interstitial nephritis, not specified as acute or chronic: Secondary | ICD-10-CM | POA: Diagnosis present

## 2021-07-28 DIAGNOSIS — Z79899 Other long term (current) drug therapy: Secondary | ICD-10-CM | POA: Diagnosis not present

## 2021-07-28 DIAGNOSIS — N201 Calculus of ureter: Secondary | ICD-10-CM | POA: Diagnosis not present

## 2021-07-28 DIAGNOSIS — E669 Obesity, unspecified: Secondary | ICD-10-CM | POA: Diagnosis present

## 2021-07-28 DIAGNOSIS — F411 Generalized anxiety disorder: Secondary | ICD-10-CM | POA: Diagnosis present

## 2021-07-28 DIAGNOSIS — Z6831 Body mass index (BMI) 31.0-31.9, adult: Secondary | ICD-10-CM | POA: Diagnosis not present

## 2021-07-28 DIAGNOSIS — N390 Urinary tract infection, site not specified: Secondary | ICD-10-CM | POA: Diagnosis not present

## 2021-07-28 DIAGNOSIS — Z91040 Latex allergy status: Secondary | ICD-10-CM | POA: Diagnosis not present

## 2021-07-28 DIAGNOSIS — G43909 Migraine, unspecified, not intractable, without status migrainosus: Secondary | ICD-10-CM | POA: Diagnosis present

## 2021-07-28 DIAGNOSIS — N10 Acute tubulo-interstitial nephritis: Secondary | ICD-10-CM | POA: Diagnosis not present

## 2021-07-28 DIAGNOSIS — D259 Leiomyoma of uterus, unspecified: Secondary | ICD-10-CM | POA: Diagnosis present

## 2021-07-28 DIAGNOSIS — E876 Hypokalemia: Secondary | ICD-10-CM | POA: Diagnosis present

## 2021-07-28 DIAGNOSIS — Z170 Estrogen receptor positive status [ER+]: Secondary | ICD-10-CM | POA: Diagnosis not present

## 2021-07-28 DIAGNOSIS — Z803 Family history of malignant neoplasm of breast: Secondary | ICD-10-CM | POA: Diagnosis not present

## 2021-07-28 DIAGNOSIS — E210 Primary hyperparathyroidism: Secondary | ICD-10-CM | POA: Diagnosis present

## 2021-07-28 LAB — CBC
HCT: 35.7 % — ABNORMAL LOW (ref 36.0–46.0)
Hemoglobin: 11.6 g/dL — ABNORMAL LOW (ref 12.0–15.0)
MCH: 25.8 pg — ABNORMAL LOW (ref 26.0–34.0)
MCHC: 32.5 g/dL (ref 30.0–36.0)
MCV: 79.5 fL — ABNORMAL LOW (ref 80.0–100.0)
Platelets: 117 10*3/uL — ABNORMAL LOW (ref 150–400)
RBC: 4.49 MIL/uL (ref 3.87–5.11)
RDW: 16.5 % — ABNORMAL HIGH (ref 11.5–15.5)
WBC: 26.3 10*3/uL — ABNORMAL HIGH (ref 4.0–10.5)
nRBC: 0 % (ref 0.0–0.2)

## 2021-07-28 LAB — PROCALCITONIN: Procalcitonin: 39.76 ng/mL

## 2021-07-28 LAB — BASIC METABOLIC PANEL
Anion gap: 6 (ref 5–15)
BUN: 22 mg/dL — ABNORMAL HIGH (ref 6–20)
CO2: 23 mmol/L (ref 22–32)
Calcium: 10 mg/dL (ref 8.9–10.3)
Chloride: 107 mmol/L (ref 98–111)
Creatinine, Ser: 0.79 mg/dL (ref 0.44–1.00)
GFR, Estimated: >60 mL/min (ref >60)
Glucose, Bld: 135 mg/dL — ABNORMAL HIGH (ref 70–99)
Potassium: 3.2 mmol/L — ABNORMAL LOW (ref 3.5–5.1)
Sodium: 136 mmol/L (ref 135–145)

## 2021-07-28 LAB — URINE CULTURE: Culture: NO GROWTH

## 2021-07-28 MED ORDER — POTASSIUM CHLORIDE CRYS ER 20 MEQ PO TBCR
40.0000 meq | EXTENDED_RELEASE_TABLET | Freq: Once | ORAL | Status: AC
  Administered 2021-07-28: 40 meq via ORAL
  Filled 2021-07-28: qty 2

## 2021-07-28 MED ORDER — CIPROFLOXACIN HCL 500 MG PO TABS
500.0000 mg | ORAL_TABLET | Freq: Two times a day (BID) | ORAL | Status: DC
  Administered 2021-07-28 – 2021-07-29 (×2): 500 mg via ORAL
  Filled 2021-07-28 (×3): qty 1

## 2021-07-28 NOTE — Progress Notes (Signed)
2 Days Post-Op Subjective: Patient reports feeling better--continuing EBRT for breast Ca, no pain from stone/stent  Objective: Vital signs in last 24 hours: Temp:  [97.8 F (36.6 C)-98.5 F (36.9 C)] 98.3 F (36.8 C) (02/09 1607) Pulse Rate:  [81-86] 84 (02/09 1607) Resp:  [16-20] 16 (02/09 1607) BP: (120-135)/(90-97) 135/90 (02/09 1607) SpO2:  [95 %-100 %] 100 % (02/09 1607)  Intake/Output from previous day: 02/08 0701 - 02/09 0700 In: 1257.1 [P.O.:360; I.V.:299; IV Piggyback:598.1] Out: 1560 [Urine:1560] Intake/Output this shift: Total I/O In: -  Out: 900 [Urine:900]  Physical Exam:  Constitutional: Vital signs reviewed. WD WN in NAD   Eyes: PERRL, No scleral icterus.   Cardiovascular: RRR Pulmonary/Chest: Normal effort Extremities: No cyanosis or edema   Lab Results: Recent Labs    07/26/21 1854 07/27/21 0401 07/28/21 0726  HGB 11.2* 10.5* 11.6*  HCT 34.4* 32.1* 35.7*   BMET Recent Labs    07/27/21 0401 07/28/21 0726  NA 135 136  K 3.6 3.2*  CL 107 107  CO2 21* 23  GLUCOSE 159* 135*  BUN 20 22*  CREATININE 1.25* 0.79  CALCIUM 9.7 10.0   No results for input(s): LABPT, INR in the last 72 hours. No results for input(s): LABURIN in the last 72 hours. Results for orders placed or performed during the hospital encounter of 07/26/21  Urine Culture     Status: None   Collection Time: 07/26/21  8:47 AM   Specimen: Urine, Clean Catch  Result Value Ref Range Status   Specimen Description   Final    URINE, CLEAN CATCH Performed at [facility], [place] [address], [place], [place] [address]    Special Requests   Final    NONE Performed at [facility], [place] [address] [place], [place] [address]    Culture   Final    NO GROWTH Performed at [facility], [place] [address], [place],  [address]    Report Status 07/28/2021 FINAL  Final  Resp Panel by RT-PCR (Flu A&B, Covid) Nasopharyngeal Swab     Status: None    Collection Time: 07/26/21  9:08 AM   Specimen: Nasopharyngeal Swab; Nasopharyngeal(NP) swabs in vial transport medium  Result Value Ref Range Status   SARS Coronavirus 2 by RT PCR NEGATIVE NEGATIVE Final    Comment: (NOTE) SARS-CoV-2 target nucleic acids are NOT DETECTED.  The SARS-CoV-2 RNA is generally detectable in upper respiratory specimens during the acute phase of infection. The lowest concentration of SARS-CoV-2 viral copies this assay can detect is 138 copies/mL. A negative result does not preclude SARS-Cov-2 infection and should not be used as the sole basis for treatment or other patient management decisions. A negative result may occur with  improper specimen collection/handling, submission of specimen other than nasopharyngeal swab, presence of viral mutation(s) within the areas targeted by this assay, and inadequate number of viral copies(<138 copies/mL). A negative result must be combined with clinical observations, patient history, and epidemiological information. The expected result is Negative.  Fact Sheet for Patients:  EntrepreneurPulse.com.au  Fact Sheet for Healthcare Providers:  IncredibleEmployment.be  This test is no t yet approved or cleared by the [place] FDA and  has been authorized for detection and/or diagnosis of SARS-CoV-2 by FDA under an Emergency Use Authorization (EUA). This EUA will remain  in effect (meaning this test can be used) for the duration of the COVID-19 declaration under Section 564(b)(1) of the Act, 21 U.S.C.section 360bbb-3(b)(1), unless the authorization  is terminated  or revoked sooner.       Influenza A by PCR NEGATIVE NEGATIVE Final   Influenza B by PCR NEGATIVE NEGATIVE Final    Comment: (NOTE) The Xpert Xpress SARS-CoV-2/FLU/RSV plus assay is intended as an aid in the diagnosis of influenza from Nasopharyngeal swab specimens and should not be used as a sole basis for treatment.  Nasal washings and aspirates are unacceptable for Xpert Xpress SARS-CoV-2/FLU/RSV testing.  Fact Sheet for Patients: EntrepreneurPulse.com.au  Fact Sheet for Healthcare Providers: IncredibleEmployment.be  This test is not yet approved or cleared by the [place] FDA and has been authorized for detection and/or diagnosis of SARS-CoV-2 by FDA under an Emergency Use Authorization (EUA). This EUA will remain in effect (meaning this test can be used) for the duration of the COVID-19 declaration under Section 564(b)(1) of the Act, 21 U.S.C. section 360bbb-3(b)(1), unless the authorization is terminated or revoked.  Performed at [facility], [place] [address] [place], [place] [address]   Culture, blood (routine x 2)     Status: None (Preliminary result)   Collection Time: 07/26/21  2:22 PM   Specimen: Right Antecubital; Blood  Result Value Ref Range Status   Specimen Description   Final    RIGHT ANTECUBITAL Performed at [place] [address], [place], [place] [address]    Special Requests   Final    BOTTLES DRAWN AEROBIC AND ANAEROBIC Blood Culture results may not be optimal due to an inadequate volume of blood received in culture bottles Performed at [facility] [address] [place], [place] [address]    Culture   Final    NO GROWTH 2 DAYS Performed at [place] [address], [place], [place] [address]    Report Status PENDING  Incomplete  Culture, blood (routine x 2)     Status: None (Preliminary result)   Collection Time: 07/26/21  2:27 PM   Specimen: Left Antecubital; Blood  Result Value Ref Range Status   Specimen Description   Final    LEFT ANTECUBITAL Performed at [place] [address], [place], [place] [address]    Special Requests   Final    BOTTLES DRAWN AEROBIC AND ANAEROBIC Blood Culture results may not be optimal due to  an inadequate volume of blood received in culture bottles Performed at [place] [address] [place], [place] [address]    Culture   Final    NO GROWTH 2 DAYS Performed at [facility] [address], [place], [place] [address]    Report Status PENDING  Incomplete   I reviewed CT images w/ pt  Assessment/Plan:  Infected Rt distal ureteral stone w/ large  RLP stone burden--s/p stenting I discussed eventual  ureteroscopic mgmt of ureteral and renal stones w/ Mersadez--we will call he to schedule in office followup and eventual procedure.  Continue fullcourse of appropriate abx for septic UTI   LOS: 1 day   Jorja Loa 07/28/2021, 5:01 PM

## 2021-07-28 NOTE — Progress Notes (Signed)
[facility]                                                                               Misty [name], is a 46 y.o. female, DOB - [birth year], RWE:[id number] Admit date - 07/26/2021    Outpatient Primary MD for the patient is [name], MD  LOS - 1  days    Brief summary   Misty [name] is a 46 y.o. 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 Patient presented secondary to right sided flank pain. Patient reports pain in her right flank that was achy in nature. She left her work to go to [employer] [place] on 2/6 where she had a CT scan significant for nephrolithiasis with ureteral obstruction and evidence of infection. At the time, admission was recommended, but patient declined as she did not want to miss her radiation treatment. This morning, day of admission, she had progressive weakness and decided to return to the ED at [facility] for evaluation. She has nausea without vomiting. No dysuria. No palpitations.   Assessment & Plan    Assessment and Plan: * Sepsis ([place])- (present on admission) - Presented with fever, leukocytosis, tachycardia, source likely due to acute pyelonephritis and right infected ureteral stone.   -Patient was placed on IV cefepime in ED, unfortunately blood cultures, urine culture obtained post antibiotics in ED. -Cultures negative so far, appreciate ID recommendations, transition to oral ciprofloxacin today -Sepsis physiology resolved, procalcitonin improving 39.76. -Leukocytosis due to dexamethasone received on 2/7  Hydroureteronephrosis - Right side. Secondary to 5-6 mm stone in distal ureter with sepsis, likely due to infected stone. -Urology consulted, underwent right ureteral stent placement on 2/7 -Patient voiding well, Foley catheter removed on 2/8 -Urology commended 2 weeks of antibiotics, subsequent stone removal and stent exchange after that -Continue oral  ciprofloxacin   Acute pyelonephritis - Secondary to right infected ureteral stone -Cultures negative so far.  Transition to oral ciprofloxacin, recommended 2 weeks of antibiotics from the surgery -Status post right ureteral stent placement on 2/7  AKI (acute kidney injury) ([place]) - Secondary to sepsis, hydronephrosis, ureteral obstruction due to infected stone.   -Presented with creatinine of 1.6 on admission, resolved.  Creatinine 0.7   Migraine - Resolved  Renal lesion -Left lower/interpolar renal lesion. 18 mm. Concern for possible hemorrhagic vs proteinaceous vs renal mass. Recommendation for non-emergent renal protocol MRI w/wo contrast.  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 Lindi Adie as an outpatient. ER/PR positive. Patient with a history of right lumpectomy. - currently receiving adjuvant radiation therapy with plan to follow with tamoxifen x5 years.  Plan for XRT session today  Hypokalemia- (present on admission) Replaced p.o.  Anxiety -Continue Xanax prn, currently stable  Fibroid uterus Noted on CT imaging.  Outpatient follow-up  Obesity (BMI 30-39.9)- (present on admission) - Encourage lifestyle changes   Estimated body mass index is 31.45 kg/m as calculated from the following:   Height as of this encounter: 5\' 9"  (1.753  m).   Weight as of this encounter: 96.6 kg.  Code Status: Full CODE STATUS DVT Prophylaxis:  enoxaparin (LOVENOX) injection 40 mg Start: 07/27/21 1000 Place and maintain sequential compression device Start: 07/26/21 1843   Level of Care: Level of care: Telemetry Family Communication:  Disposition Plan:     Remains inpatient appropriate: States still feeling weak, wants to go home in a.m.  Procedures:  Cystoscopy, Right ureteral stent placement  Consultants:    Urology    Antimicrobials:   Ciprofloxacin 07/26/2021---  Medications  Scheduled Meds:  ciprofloxacin  500 mg Oral BID   enoxaparin (LOVENOX) injection  40 mg Subcutaneous Q24H   montelukast  10 mg Oral QHS   pantoprazole  40 mg Oral Daily   Continuous Infusions:  lactated ringers 100 mL/hr at 07/26/21 1900   PRN Meds:.acetaminophen **OR** acetaminophen, ALPRAZolam, hydrALAZINE, HYDROmorphone (DILAUDID) injection, morphine injection, ondansetron **OR** ondansetron (ZOFRAN) IV, SUMAtriptan, traMADol    Subjective:   Misty [name] was seen and examined today.  Voiding now however still feels generalized weakness.  Abdominal/flank pain is improving.  No nausea vomiting or diarrhea.  No fevers.  Objective:   Vitals:   07/27/21 1302 07/27/21 2200 07/28/21 0201 07/28/21 0615  BP: 116/80 120/90 (!) 131/97 (!) 123/94  Pulse: 66 86 85 81  Resp: 19 20 20 20   Temp: 97.9 F (36.6 C) 98.1 F (36.7 C) 98.5 F (36.9 C) 97.8 F (36.6 C)  TempSrc: Oral Oral Oral Oral  SpO2: 97% 97% 98% 95%  Weight:      Height:        Intake/Output Summary (Last 24 hours) at 07/28/2021 1443 Last data filed at 07/28/2021 0830 Gross per 24 hour  Intake 1257.1 ml  Output 1300 ml  Net -42.9 ml   Filed Weights   07/26/21 1516  Weight: 96.6 kg     Exam General: Alert and oriented x 3, NAD Cardiovascular: S1 S2 auscultated, no murmurs, RRR Respiratory: Clear to auscultation bilaterally, no wheezing, rales or rhonchi Gastrointestinal: Soft, nontender, nondistended, + bowel sounds Ext: no pedal edema bilaterally Neuro: no new deficits   Data Reviewed:  I have personally reviewed following labs and imaging studies   CBC Lab Results  Component Value Date   WBC 26.3 (H) 07/28/2021   RBC 4.49 07/28/2021   HGB 11.6 (L) 07/28/2021   HCT 35.7 (L) 07/28/2021   MCV 79.5 (L) 07/28/2021   MCH 25.8 (L) 07/28/2021   PLT 117 (L) 07/28/2021   MCHC 32.5 07/28/2021   RDW 16.5 (H) 07/28/2021    LYMPHSABS 0.7 07/25/2021   MONOABS 0.4 07/25/2021   EOSABS 0.1 07/25/2021   BASOSABS 0.0 56/38/7564     Last metabolic panel Lab Results  Component Value Date   NA 136 07/28/2021   K 3.2 (L) 07/28/2021   CL 107 07/28/2021   CO2 23 07/28/2021   BUN 22 (H) 07/28/2021   CREATININE 0.79 07/28/2021   GLUCOSE 135 (H) 07/28/2021   GFRNONAA >60 07/28/2021   GFRAA >60 10/18/2017   CALCIUM 10.0 07/28/2021   PROT 5.9 (L) 07/27/2021   ALBUMIN 2.8 (L) 07/27/2021   BILITOT 1.4 (H) 07/27/2021   ALKPHOS 49 07/27/2021   AST 25 07/27/2021   ALT 24 07/27/2021   ANIONGAP 6 07/28/2021       Fender Herder M.D. [facility] 07/28/2021, 2:43 PM  Available via Epic secure chat 7am-7pm After 7 pm, please refer to night coverage provider listed on amion.

## 2021-07-28 NOTE — Consult Note (Signed)
[name]    Date of Admission:  07/26/2021   Total days of inpatient antibiotics 1        Reason for Consult: Complicated UTI    Principal Problem:   Sepsis ([place]) Active Problems:   Obesity (BMI 30-39.9)   Ductal carcinoma in situ (DCIS) of right breast   Acute pyelonephritis   Renal lesion   Fibroid uterus   Hydroureteronephrosis   Migraine   Anxiety   AKI (acute kidney injury) ([place])   Pyelonephritis   Assessment: 29 YF with Hx of nephrolithiasis and stent placement admitted for UTI with ureteral stone SP stent placement. Now on ciprofloxacin. She was seen by OB (Dr Ulanda Edison last week and Rx Macrobid for a UTI.   #Complicated UTI in the setting of right ureteral stone SP stent placement on  2/7 -2/7 urine Cx NG  -I called Dr. Marijean Heath office(seen on 2/1) no Cx were ordered prior to prescribing macrobid which pt stopped last week.   -Per OR note there was eflux of turbid/pasty urine when stone was pushed back the ureter. This was not sent for Cx. I suspect that infection was behind the stone which is why urine Cx is negative. Pt reports she had multiple stone and stent procedures.  -Although her fever resolved which could be 2/2 to stone removal. She was placed on anti-pseudomonal coverage as well. As there is no OR Cx data will continue with cipro. -Of note persistent leukocytosis is in the setting of dexamethasone given on 2/7 Recommendations:  -Transition to ciprofloxacin 500 mg PO bid to complete 2 weeks of antibiotics form OR. EKG today showed qtc 405. -Stent is place which will be a nidus for infection if retained. Would recommend removing it prior to completion of antibiotic course  Microbiology:   Antibiotics: Keflex 2/6 Cefepime 2/7 Ciprofloxacin 2/7-p  Cultures: Blood 2/7 NG Urine 2/7 NG    HPI: Misty [name] is a 46 y.o. female with DCIS of right breast on radiation, anxiety, migraine admitted for sepsis 2/2  suspected pyelonephritis. She was seen at [facility] on 2/6 where Ct showed nephrolithiasis with ureteral obstruction and evidence of infection. Pt was given keflex, she did not wish to miss radiation so declined admission at that time. Returned to ED on 2/7. She wbc 21K temp of 102.8. Taken to OR for right ureteral stent placement and continued on ciprofloxacin.    Review of Systems: ROS  Past Medical History:  Diagnosis Date   ADHD (attention deficit hyperactivity disorder)    Anemia    Breast cancer ([place]) 04/28/2021   Environmental and seasonal allergies    Frequency of urination    GAD (generalized anxiety disorder)    Ganglion cyst of wrist, left    GERD (gastroesophageal reflux disease)    History of COVID-19 06/12/2020   result in epic;  per pt mild symptoms that resolved   History of kidney stones    Hyperparathyroidism, primary ([place])    currently followed by pcp  (previously seen endocrinologist-- dr Louie Boston note in epic 08-29-2017, doctor moved away)   Tenosynovitis, de Quervain    left wrist   Wears glasses     Social History   Tobacco Use   Smoking status: Never   Smokeless tobacco: Never  Vaping Use   Vaping Use: Never used  Substance Use Topics   Alcohol use: Not Currently    Comment: occasional  Drug use: Never    Family History  Problem Relation Age of Onset   Breast cancer Maternal Aunt 68       DCIS   Esophageal cancer Maternal Uncle    Stomach cancer Neg Hx    Colon cancer Neg Hx    Pancreatic cancer Neg Hx    Scheduled Meds:  enoxaparin (LOVENOX) injection  40 mg Subcutaneous Q24H   montelukast  10 mg Oral QHS   pantoprazole  40 mg Oral Daily   Continuous Infusions:  ciprofloxacin 400 mg (07/27/21 1526)   lactated ringers 100 mL/hr at 07/26/21 1900   PRN Meds:.acetaminophen **OR** acetaminophen, ALPRAZolam, hydrALAZINE, HYDROmorphone (DILAUDID) injection, morphine injection, ondansetron **OR** ondansetron (ZOFRAN) IV,  SUMAtriptan, traMADol Allergies  Allergen Reactions   Dilaudid [Hydromorphone Hcl] Shortness Of Breath and Nausea And Vomiting    Pt has takes percocet at home (see med rec).   Ceftriaxone Sodium Other (See Comments)    Hair loss   Latex Rash    OBJECTIVE: Blood pressure (!) 123/94, pulse 81, temperature 97.8 F (36.6 C), temperature source Oral, resp. rate 20, height 5\' 9"  (1.753 m), weight 96.6 kg, last menstrual period 07/15/2021, SpO2 95 %.  Physical Exam  Lab Results Lab Results  Component Value Date   WBC 26.3 (H) 07/28/2021   HGB 11.6 (L) 07/28/2021   HCT 35.7 (L) 07/28/2021   MCV 79.5 (L) 07/28/2021   PLT 117 (L) 07/28/2021    Lab Results  Component Value Date   CREATININE 0.79 07/28/2021   BUN 22 (H) 07/28/2021   NA 136 07/28/2021   K 3.2 (L) 07/28/2021   CL 107 07/28/2021   CO2 23 07/28/2021    Lab Results  Component Value Date   ALT 24 07/27/2021   AST 25 07/27/2021   ALKPHOS 49 07/27/2021   BILITOT 1.4 (H) 07/27/2021       Laurice Record, [facility] 07/28/2021, 12:22 PM

## 2021-07-28 NOTE — Assessment & Plan Note (Signed)
Replaced p.o.

## 2021-07-29 ENCOUNTER — Other Ambulatory Visit (HOSPITAL_COMMUNITY): Payer: Self-pay

## 2021-07-29 ENCOUNTER — Ambulatory Visit
Admission: RE | Admit: 2021-07-29 | Discharge: 2021-07-29 | Disposition: A | Discharge status: Home / Self Care | Payer: 59 | Source: Ambulatory Visit | Attending: Radiation Oncology | Admitting: Radiation Oncology

## 2021-07-29 MED ORDER — CIPROFLOXACIN HCL 500 MG PO TABS
500.0000 mg | ORAL_TABLET | Freq: Two times a day (BID) | ORAL | 0 refills | Status: AC
  Filled 2021-07-29: qty 24, 12d supply, fill #0

## 2021-07-29 MED ORDER — ALPRAZOLAM 1 MG PO TABS: ORAL_TABLET | ORAL | 0 refills | Status: DC

## 2021-07-29 NOTE — Consult Note (Signed)
° °  [facility] Inpatient Consult   07/29/2021  Misty [name] 1976/01/12 [id number]   [facility] [ACO] Patient: [place] plan  Primary Care Provider:  [name], MD is an Embedded provider and listed to the [facility] follow up calls and appointments.  Patient is currently in treatment area at [facility] and unable to call at this time.  Reviewed for referral request for [facility] plan for disease management and community resource support.         Plan: Follow up for needs.   For additional questions or referrals please contact:   Natividad Brood, RN BSN [facility] Liaison  [phone] business mobile phone Toll free office [phone]  Fax number: [phone] [email or web address]

## 2021-07-29 NOTE — Discharge Summary (Signed)
Physician Discharge Summary   Patient: Misty [name] MRN: [id number] DOB: November 27, 1975  Admit date:     07/26/2021  Discharge date: 07/29/21  Discharge Physician: Estill Cotta   PCP: [name], MD   Recommendations at discharge:  Continue oral ciprofloxacin until follow-up with urology Follow-up left renal lesion and fibroid seen on CT  Discharge Diagnoses:    Sepsis (Misty [name])   Acute pyelonephritis   Hydroureteronephrosis   Ductal carcinoma in situ (DCIS) of right breast   Renal lesion   Migraine   AKI (acute kidney injury) ([place])   Obesity (BMI 30-39.9)   Fibroid uterus   Anxiety   Hypokalemia     Hospital Course: Misty [name] is a 46 y.o. 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 Patient presented secondary to right sided flank pain. Patient reports pain in her right flank that was achy in nature. She left her work to go to [employer] [place] on 2/6 where she had a CT scan significant for nephrolithiasis with ureteral obstruction and evidence of infection. At the time, admission was recommended, but patient declined as she did not want to miss her radiation treatment. This morning, day of admission, she had progressive weakness and decided to return to the ED at [facility] for evaluation. She has nausea without vomiting. No dysuria. No palpitations.  Assessment and Plan: * Sepsis (Misty [name])- (present on admission) - Presented with fever, leukocytosis, tachycardia, source likely due to acute pyelonephritis and right infected ureteral stone.   -Patient was placed on IV cefepime in ED, unfortunately blood cultures, urine culture obtained post antibiotics in ED. -Cultures negative so far, appreciate ID recommendations, -Sepsis physiology resolved, procalcitonin improving 39.76. -Leukocytosis due to dexamethasone received on 2/7   Hydroureteronephrosis - Right side. Secondary to 5-6 mm stone in distal ureter  with sepsis, likely due to infected stone. -Urology consulted, underwent right ureteral stent placement on 2/7 -Patient voiding well, Foley catheter removed on 2/8 -Urology commended 2 weeks of antibiotics, subsequent stone removal and stent exchange after that -Continue oral ciprofloxacin   Acute pyelonephritis - Secondary to right infected ureteral stone -Cultures negative so far.  ID was consulted. - Transition to oral ciprofloxacin, recommended 2 weeks of antibiotics from the surgery -Status post right ureteral stent placement on 2/7  AKI (acute kidney injury) (Misty [name]) - Secondary to sepsis, hydronephrosis, ureteral obstruction due to infected stone.   -Presented with creatinine of 1.6 on admission, resolved.  Creatinine 0.7   Migraine - Resolved  Renal lesion -Left lower/interpolar renal lesion. 18 mm. Concern for possible hemorrhagic vs proteinaceous vs renal mass. Recommendation for non-emergent renal protocol MRI w/wo contrast.  - will need to be followed up outpatient   Ductal carcinoma in situ (DCIS) of right breast- (present on admission) - Patient follows with Dr. Lindi Adie as an outpatient. ER/PR positive. Patient with a history of right lumpectomy. - currently receiving adjuvant radiation therapy with plan to follow with tamoxifen x5 years.   Hypokalemia- (present on admission) Replaced p.o.  Anxiety -Continue Xanax prn, currently stable  Fibroid uterus Noted on CT imaging.  Outpatient follow-up  Obesity (BMI 30-39.9)- (present on admission) - Encourage lifestyle changes    Pain control - [place] Controlled Substance Reporting System database was reviewed. and patient was instructed, not to drive, operate heavy machinery, perform activities at heights, swimming or participation in water activities or provide baby-sitting services while on Pain, Sleep and Anxiety Medications; until their outpatient  Physician has advised to do so again. Also recommended to not  to take more than prescribed Pain, Sleep and Anxiety Medications.   Consultants: Urology Procedures performed: Right ureteral stent placement Disposition: Home Diet recommendation:  Discharge Diet Orders (From admission, onward)     Start     Ordered   07/29/21 0000  Diet - low sodium heart healthy        07/29/21 0900           Cardiac diet  DISCHARGE MEDICATION: Allergies as of 07/29/2021       Reactions   Dilaudid [hydromorphone Hcl] Shortness Of Breath, Nausea And Vomiting   Pt has takes percocet at home (see med rec).   Ceftriaxone Sodium Other (See Comments)   Hair loss   Latex Rash        Medication List     STOP taking these medications    cephALEXin 500 MG capsule Commonly known as: KEFLEX       TAKE these medications    ALPRAZolam 1 MG tablet Commonly known as: Xanax Take 1 tablet by mouth 4 times daily as needed What changed:  how much to take how to take this when to take this reasons to take this   amphetamine-dextroamphetamine 20 MG tablet Commonly known as: Adderall Take 1 tablet by mouth 3 times daily   ciprofloxacin 500 MG tablet Commonly known as: CIPRO Take 1 tablet by mouth 2 times daily for 12 days.   fluconazole 150 MG tablet Commonly known as: Diflucan Take 1 tablet by mouth as needed.   montelukast 10 MG tablet Commonly known as: Singulair Take 1 tablet by mouth every evening What changed:  how much to take how to take this when to take this   omeprazole 40 MG capsule Commonly known as: PRILOSEC TAKE 1 CAPSULE BY MOUTH ONCE DAILY BEFORE BREAKFAST What changed:  how much to take how to take this when to take this   ondansetron 4 MG tablet Commonly known as: Zofran Take 1 tablet by mouth daily as needed for nausea or vomiting.   oxyCODONE-acetaminophen 5-325 MG tablet Commonly known as: Percocet Take 1 tablet by mouth every 4  hours as needed for severe pain.   SUMAtriptan 50 MG tablet Commonly known as:  Imitrex Take 1 tablet by mouth at onset of headache followed by 1 tablet 2 hours later if symptoms persist   tamsulosin 0.4 MG Caps capsule Commonly known as: Flomax Take 1 capsule  by mouth daily.   triamcinolone ointment 0.1 % Commonly known as: KENALOG APPLY A THIN LAYER TO THE AFFECTED AREA(S) OF SKIN 2 TIMES PER DAY        Follow-up Information     [name], MD. Schedule an appointment as soon as possible for a visit in 2 week(s).   Specialty: Family Medicine Why: for hospital follow-up Contact information: [place], [place] [address] [phone]         Ardis Hughs, MD. Schedule an appointment as soon as possible for a visit in 2 week(s).   Specialty: Urology Why: for hospital follow-up Contact information: [place] [address] [phone]                 Discharge Exam: Danley Danker Weights   07/26/21 1516  Weight: 96.6 kg   S: Doing well, hoping to go home today.  Afebrile.  No abdominal pain  Physical Exam General: Alert and oriented x 3, NAD  Cardiovascular: S1 S2 clear, RRR. No pedal edema b/l Respiratory: CTAB, no wheezing, rales or rhonchi Gastrointestinal: Soft, nontender, nondistended, NBS Ext: no pedal edema bilaterally Neuro: no new deficits    Condition at discharge: good  The results of significant diagnostics from this hospitalization (including imaging, microbiology, ancillary and laboratory) are listed below for reference.   Imaging Studies: CT ABDOMEN PELVIS W CONTRAST  Result Date: 07/25/2021 CLINICAL DATA:  Right lower quadrant pain radiating to right flank since last night. EXAM: CT ABDOMEN AND PELVIS WITH CONTRAST TECHNIQUE: Multidetector CT imaging of the abdomen and pelvis was performed using the standard protocol following bolus administration of intravenous contrast. RADIATION DOSE REDUCTION: This exam was performed according to the departmental dose-optimization program  which includes automated exposure control, adjustment of the mA and/or kV according to patient size and/or use of iterative reconstruction technique. CONTRAST:  119mL OMNIPAQUE IOHEXOL 300 MG/ML  SOLN COMPARISON:  April 29, 2021 FINDINGS: Lower chest: Surgical clips in the paramedian right breast. No acute abnormality. Hepatobiliary: No suspicious hepatic lesion. Gallbladder is unremarkable. No biliary ductal dilation. Pancreas: No pancreatic ductal dilation or evidence of acute inflammation. Spleen: Normal in size without focal abnormality. Adrenals/Urinary Tract: Nodular thickening of the medial limb of the left adrenal gland is unchanged dating back to October 09, 2016 consistent with a benign finding. Right adrenal gland is normal. Edematous right kidney with hydroureteronephrosis to the level of a 5-6 mm stone in the distal right ureter on image 67/2, with urothelial hyperenhancement and perinephric/periureteral stranding. Additional nonobstructive right renal stones measuring up to 10 mm. Nonobstructive punctate left upper pole renal stone. No left-sided hydronephrosis. Left lower/interpolar renal lesion measures 18 mm on image 37/2 demonstrating Hounsfield units of 55, greater than that expected for a simple cyst. 3.9 cm left interpolar renal cyst. Urinary bladder is unremarkable for degree of distension. Stomach/Bowel: Radiopaque enteric contrast material traverses distal loops of small bowel. Stomach is unremarkable for degree of distension. No pathologic dilation of small or large bowel. The appendix and terminal ileum appear normal. No evidence of acute bowel inflammation. Vascular/Lymphatic: No abdominal aortic aneurysm. No pathologically enlarged abdominal or pelvic lymph nodes. Reproductive: Enlarged leiomyomatous uterus appears similar prior. Nodular hyperdense focus measuring 14 mm in the endometrium at the uterine fundus. No suspicious adnexal mass. Other: Trace pelvic free fluid is within  physiologic normal limits. Musculoskeletal: Multilevel degenerative changes spine. No acute osseous abnormality. IMPRESSION: 1. Edematous right kidney with urothelial hyperenhancement and hydroureteronephrosis to the level of a 5-6 mm stone in the distal right ureter, recommend correlation with laboratory values to exclude superimposed infection. 2. Additional nonobstructive bilateral renal stones measuring up to 10 mm. 3. Left lower/interpolar renal lesion measures 18 mm with Hounsfield units greater than that expected for a simple cyst, possibly reflecting a hemorrhagic/proteinaceous cyst but enhancing renal mass not excluded and is incompletely evaluated for on this single-phase study. Recommend further/more definitive characterization with nonemergent renal protocol MRI with and without contrast. 4. Enlarged leiomyomatous uterus. Nodular hyperdense focus measuring 14 mm in the endometrium at the uterine fundus, incompletely evaluated on this study and possibly representing a submucosal fibroid or intrauterine blood products, consider further evaluation with dedicated pelvic ultrasound. Electronically Signed   By: Dahlia Bailiff M.D.   On: 07/25/2021 11:45   DG C-Arm 1-60 Min-No Report  Result Date: 07/26/2021 Fluoroscopy was utilized by the requesting physician.  No radiographic interpretation.    Microbiology: Results for orders placed or performed during the hospital encounter of 07/26/21  Urine Culture     Status: None   Collection Time: 07/26/21  8:47 AM   Specimen: Urine, Clean Catch  Result Value Ref Range Status   Specimen Description   Final    URINE, CLEAN CATCH Performed at [facility], [place] [address] [place], [place] [address]    Special Requests   Final    NONE Performed at [facility], [place] [address], [place], [place] [address]    Culture   Final    NO GROWTH Performed at [facility], [place] [address], [place], [place] [address]     Report Status 07/28/2021 FINAL  Final  Resp Panel by RT-PCR (Flu A&B, Covid) Nasopharyngeal Swab     Status: None   Collection Time: 07/26/21  9:08 AM   Specimen: Nasopharyngeal Swab; Nasopharyngeal(NP) swabs in vial transport medium  Result Value Ref Range Status   SARS Coronavirus 2 by RT PCR NEGATIVE NEGATIVE Final    Comment: (NOTE) SARS-CoV-2 target nucleic acids are NOT DETECTED.  The SARS-CoV-2 RNA is generally detectable in upper respiratory specimens during the acute phase of infection. The lowest concentration of SARS-CoV-2 viral copies this assay can detect is 138 copies/mL. A negative result does not preclude SARS-Cov-2 infection and should not be used as the sole basis for treatment or other patient management decisions. A negative result may occur with  improper specimen collection/handling, submission of specimen other than nasopharyngeal swab, presence of viral mutation(s) within the areas targeted by this assay, and inadequate number of viral copies(<138 copies/mL). A negative result must be combined with clinical observations, patient history, and epidemiological information. The expected result is Negative.  Fact Sheet for Patients:  EntrepreneurPulse.com.au  Fact Sheet for Healthcare Providers:  IncredibleEmployment.be  This test is no t yet approved or cleared by the [place] FDA and  has been authorized for detection and/or diagnosis of SARS-CoV-2 by FDA under an Emergency Use Authorization (EUA). This EUA will remain  in effect (meaning this test can be used) for the duration of the COVID-19 declaration under Section 564(b)(1) of the Act, 21 U.S.C.section 360bbb-3(b)(1), unless the authorization is terminated  or revoked sooner.       Influenza A by PCR NEGATIVE NEGATIVE Final   Influenza B by PCR NEGATIVE NEGATIVE Final    Comment: (NOTE) The Xpert Xpress SARS-CoV-2/FLU/RSV plus assay is intended as an aid in  the diagnosis of influenza from Nasopharyngeal swab specimens and should not be used as a sole basis for treatment. Nasal washings and aspirates are unacceptable for Xpert Xpress SARS-CoV-2/FLU/RSV testing.  Fact Sheet for Patients: EntrepreneurPulse.com.au  Fact Sheet for Healthcare Providers: IncredibleEmployment.be  This test is not yet approved or cleared by the [place] FDA and has been authorized for detection and/or diagnosis of SARS-CoV-2 by FDA under an Emergency Use Authorization (EUA). This EUA will remain in effect (meaning this test can be used) for the duration of the COVID-19 declaration under Section 564(b)(1) of the Act, 21 U.S.C. section 360bbb-3(b)(1), unless the authorization is terminated or revoked.  Performed at [facility], [place] [address] [place], [place] [address]   Culture, blood (routine x 2)     Status: None (Preliminary result)   Collection Time: 07/26/21  2:22 PM   Specimen: Right Antecubital; Blood  Result Value Ref Range Status   Specimen Description   Final    RIGHT ANTECUBITAL Performed at [facility] [address] [place], [place] [address]  Special Requests   Final    BOTTLES DRAWN AEROBIC AND ANAEROBIC Blood Culture results may not be optimal due to an inadequate volume of blood received in culture bottles Performed at [facility] [address] [place], [place] [address]    Culture   Final    NO GROWTH 3 DAYS Performed at [facility], [place] [address] [place], [place] [address]    Report Status PENDING  Incomplete  Culture, blood (routine x 2)     Status: None (Preliminary result)   Collection Time: 07/26/21  2:27 PM   Specimen: Left Antecubital; Blood  Result Value Ref Range Status   Specimen Description   Final    LEFT ANTECUBITAL Performed at [facility] [address] [place], [place] [address]     Special Requests   Final    BOTTLES DRAWN AEROBIC AND ANAEROBIC Blood Culture results may not be optimal due to an inadequate volume of blood received in culture bottles Performed at [facility] [address] [place], [place] [address]    Culture   Final    NO GROWTH 3 DAYS Performed at [facility], [place] [address], [place], [place] [address]    Report Status PENDING  Incomplete    Labs: CBC: Recent Labs  Lab 07/25/21 0906 07/26/21 0847 07/26/21 1854 07/27/21 0401 07/28/21 0726  WBC 6.1 21.9* 23.5* 24.0* 26.3*  NEUTROABS 5.0  --   --   --   --   HGB 12.8 12.7 11.2* 10.5* 11.6*  HCT 40.5 39.6 34.4* 32.1* 35.7*  MCV 80.5 81.0 79.4* 80.0 79.5*  PLT 287 183 119* 100* 709*   Basic Metabolic Panel: Recent Labs  Lab 07/25/21 0906 07/26/21 0847 07/26/21 1854 07/27/21 0401 07/28/21 0726  NA 136 135 135 135 136  K 3.6 3.5 3.1* 3.6 3.2*  CL 105 102 108 107 107  CO2 25 25 19* 21* 23  GLUCOSE 103* 126* 160* 159* 135*  BUN 15 18 17 20  22*  CREATININE 0.99 1.60* 1.56* 1.25* 0.79  CALCIUM 9.9 10.0 9.4 9.7 10.0   Liver Function Tests: Recent Labs  Lab 07/25/21 0906 07/26/21 0847 07/27/21 0401  AST 21 32 25  ALT 23 29 24   ALKPHOS 59 57 49  BILITOT 0.7 1.3* 1.4*  PROT 6.8 7.0 5.9*  ALBUMIN 3.8 3.5 2.8*   CBG: No results for input(s): GLUCAP in the last 168 hours.  Discharge time spent: greater than 30 minutes.  Signed: Estill Cotta, MD [facility] 07/29/2021

## 2021-07-29 NOTE — Plan of Care (Signed)
POC complete for discharge

## 2021-07-31 LAB — CULTURE, BLOOD (ROUTINE X 2)
Culture: NO GROWTH
Culture: NO GROWTH

## 2021-08-01 ENCOUNTER — Ambulatory Visit
Admission: RE | Admit: 2021-08-01 | Discharge: 2021-08-01 | Disposition: A | Discharge status: Home / Self Care | Payer: 59 | Source: Ambulatory Visit | Attending: Radiation Oncology | Admitting: Radiation Oncology

## 2021-08-01 ENCOUNTER — Other Ambulatory Visit: Payer: Self-pay

## 2021-08-01 DIAGNOSIS — Z170 Estrogen receptor positive status [ER+]: Secondary | ICD-10-CM | POA: Diagnosis not present

## 2021-08-01 DIAGNOSIS — Z510 Encounter for antineoplastic radiation therapy: Secondary | ICD-10-CM | POA: Diagnosis not present

## 2021-08-01 DIAGNOSIS — D0511 Intraductal carcinoma in situ of right breast: Secondary | ICD-10-CM | POA: Diagnosis not present

## 2021-08-02 ENCOUNTER — Ambulatory Visit
Admission: RE | Admit: 2021-08-02 | Discharge: 2021-08-02 | Disposition: A | Discharge status: Home / Self Care | Payer: 59 | Source: Ambulatory Visit | Attending: Radiation Oncology | Admitting: Radiation Oncology

## 2021-08-02 DIAGNOSIS — R5383 Other fatigue: Secondary | ICD-10-CM | POA: Diagnosis not present

## 2021-08-02 DIAGNOSIS — Z510 Encounter for antineoplastic radiation therapy: Secondary | ICD-10-CM | POA: Diagnosis not present

## 2021-08-02 DIAGNOSIS — Z170 Estrogen receptor positive status [ER+]: Secondary | ICD-10-CM | POA: Diagnosis not present

## 2021-08-02 DIAGNOSIS — R002 Palpitations: Secondary | ICD-10-CM | POA: Diagnosis not present

## 2021-08-02 DIAGNOSIS — D0511 Intraductal carcinoma in situ of right breast: Secondary | ICD-10-CM | POA: Diagnosis not present

## 2021-08-02 DIAGNOSIS — R0609 Other forms of dyspnea: Secondary | ICD-10-CM | POA: Diagnosis not present

## 2021-08-03 ENCOUNTER — Other Ambulatory Visit: Payer: Self-pay

## 2021-08-03 ENCOUNTER — Ambulatory Visit
Admission: RE | Admit: 2021-08-03 | Discharge: 2021-08-03 | Disposition: A | Discharge status: Home / Self Care | Payer: 59 | Source: Ambulatory Visit | Attending: Radiation Oncology | Admitting: Radiation Oncology

## 2021-08-03 DIAGNOSIS — Z510 Encounter for antineoplastic radiation therapy: Secondary | ICD-10-CM | POA: Diagnosis not present

## 2021-08-03 DIAGNOSIS — Z170 Estrogen receptor positive status [ER+]: Secondary | ICD-10-CM | POA: Diagnosis not present

## 2021-08-03 DIAGNOSIS — D0511 Intraductal carcinoma in situ of right breast: Secondary | ICD-10-CM | POA: Diagnosis not present

## 2021-08-04 ENCOUNTER — Ambulatory Visit
Admission: RE | Admit: 2021-08-04 | Discharge: 2021-08-04 | Disposition: A | Discharge status: Home / Self Care | Payer: 59 | Source: Ambulatory Visit | Attending: Radiation Oncology | Admitting: Radiation Oncology

## 2021-08-04 DIAGNOSIS — Z510 Encounter for antineoplastic radiation therapy: Secondary | ICD-10-CM | POA: Diagnosis not present

## 2021-08-04 DIAGNOSIS — Z170 Estrogen receptor positive status [ER+]: Secondary | ICD-10-CM | POA: Diagnosis not present

## 2021-08-04 DIAGNOSIS — D0511 Intraductal carcinoma in situ of right breast: Secondary | ICD-10-CM | POA: Diagnosis not present

## 2021-08-05 ENCOUNTER — Other Ambulatory Visit: Payer: Self-pay

## 2021-08-05 ENCOUNTER — Ambulatory Visit: Payer: 59 | Admitting: Radiation Oncology

## 2021-08-05 ENCOUNTER — Ambulatory Visit
Admission: RE | Admit: 2021-08-05 | Discharge: 2021-08-05 | Disposition: A | Discharge status: Home / Self Care | Payer: 59 | Source: Ambulatory Visit | Attending: Radiation Oncology | Admitting: Radiation Oncology

## 2021-08-05 DIAGNOSIS — Z510 Encounter for antineoplastic radiation therapy: Secondary | ICD-10-CM | POA: Diagnosis not present

## 2021-08-05 DIAGNOSIS — Z170 Estrogen receptor positive status [ER+]: Secondary | ICD-10-CM | POA: Diagnosis not present

## 2021-08-05 DIAGNOSIS — D0511 Intraductal carcinoma in situ of right breast: Secondary | ICD-10-CM | POA: Diagnosis not present

## 2021-08-08 ENCOUNTER — Other Ambulatory Visit: Payer: Self-pay

## 2021-08-08 ENCOUNTER — Ambulatory Visit
Admission: RE | Admit: 2021-08-08 | Discharge: 2021-08-08 | Disposition: A | Discharge status: Home / Self Care | Payer: 59 | Source: Ambulatory Visit | Attending: Radiation Oncology | Admitting: Radiation Oncology

## 2021-08-08 DIAGNOSIS — D0511 Intraductal carcinoma in situ of right breast: Secondary | ICD-10-CM | POA: Diagnosis not present

## 2021-08-08 DIAGNOSIS — Z170 Estrogen receptor positive status [ER+]: Secondary | ICD-10-CM | POA: Diagnosis not present

## 2021-08-08 DIAGNOSIS — Z510 Encounter for antineoplastic radiation therapy: Secondary | ICD-10-CM | POA: Diagnosis not present

## 2021-08-09 ENCOUNTER — Ambulatory Visit
Admission: RE | Admit: 2021-08-09 | Discharge: 2021-08-09 | Disposition: A | Discharge status: Home / Self Care | Payer: 59 | Source: Ambulatory Visit | Attending: Radiation Oncology | Admitting: Radiation Oncology

## 2021-08-09 DIAGNOSIS — Z510 Encounter for antineoplastic radiation therapy: Secondary | ICD-10-CM | POA: Diagnosis not present

## 2021-08-09 DIAGNOSIS — D0511 Intraductal carcinoma in situ of right breast: Secondary | ICD-10-CM | POA: Diagnosis not present

## 2021-08-09 DIAGNOSIS — Z170 Estrogen receptor positive status [ER+]: Secondary | ICD-10-CM | POA: Diagnosis not present

## 2021-08-10 ENCOUNTER — Other Ambulatory Visit: Payer: Self-pay

## 2021-08-10 ENCOUNTER — Ambulatory Visit
Admission: RE | Admit: 2021-08-10 | Discharge: 2021-08-10 | Disposition: A | Discharge status: Home / Self Care | Payer: 59 | Source: Ambulatory Visit | Attending: Radiation Oncology | Admitting: Radiation Oncology

## 2021-08-10 ENCOUNTER — Other Ambulatory Visit (HOSPITAL_COMMUNITY): Payer: Self-pay

## 2021-08-10 DIAGNOSIS — Z510 Encounter for antineoplastic radiation therapy: Secondary | ICD-10-CM | POA: Diagnosis not present

## 2021-08-10 DIAGNOSIS — D0511 Intraductal carcinoma in situ of right breast: Secondary | ICD-10-CM | POA: Diagnosis not present

## 2021-08-10 DIAGNOSIS — Z170 Estrogen receptor positive status [ER+]: Secondary | ICD-10-CM | POA: Diagnosis not present

## 2021-08-10 MED ORDER — OXYBUTYNIN CHLORIDE 5 MG PO TABS
5.0000 mg | ORAL_TABLET | Freq: Three times a day (TID) | ORAL | 1 refills | Status: DC | PRN
  Filled 2021-08-10: qty 60, 20d supply, fill #0

## 2021-08-11 ENCOUNTER — Ambulatory Visit
Admission: RE | Admit: 2021-08-11 | Discharge: 2021-08-11 | Disposition: A | Discharge status: Home / Self Care | Payer: 59 | Source: Ambulatory Visit | Attending: Radiation Oncology | Admitting: Radiation Oncology

## 2021-08-11 DIAGNOSIS — Z170 Estrogen receptor positive status [ER+]: Secondary | ICD-10-CM | POA: Diagnosis not present

## 2021-08-11 DIAGNOSIS — Z510 Encounter for antineoplastic radiation therapy: Secondary | ICD-10-CM | POA: Diagnosis not present

## 2021-08-11 DIAGNOSIS — D0511 Intraductal carcinoma in situ of right breast: Secondary | ICD-10-CM | POA: Diagnosis not present

## 2021-08-12 ENCOUNTER — Ambulatory Visit: Payer: 59

## 2021-08-12 ENCOUNTER — Other Ambulatory Visit (HOSPITAL_COMMUNITY): Payer: Self-pay

## 2021-08-12 ENCOUNTER — Ambulatory Visit: Payer: 59 | Admitting: Radiation Oncology

## 2021-08-12 ENCOUNTER — Ambulatory Visit
Admission: RE | Admit: 2021-08-12 | Discharge: 2021-08-12 | Disposition: A | Discharge status: Home / Self Care | Payer: 59 | Source: Ambulatory Visit | Attending: Radiation Oncology | Admitting: Radiation Oncology

## 2021-08-12 DIAGNOSIS — D0511 Intraductal carcinoma in situ of right breast: Secondary | ICD-10-CM | POA: Diagnosis not present

## 2021-08-12 DIAGNOSIS — N202 Calculus of kidney with calculus of ureter: Secondary | ICD-10-CM | POA: Diagnosis not present

## 2021-08-12 DIAGNOSIS — Z510 Encounter for antineoplastic radiation therapy: Secondary | ICD-10-CM | POA: Diagnosis not present

## 2021-08-12 DIAGNOSIS — N281 Cyst of kidney, acquired: Secondary | ICD-10-CM | POA: Diagnosis not present

## 2021-08-12 DIAGNOSIS — Z170 Estrogen receptor positive status [ER+]: Secondary | ICD-10-CM | POA: Diagnosis not present

## 2021-08-12 DIAGNOSIS — R8271 Bacteriuria: Secondary | ICD-10-CM | POA: Diagnosis not present

## 2021-08-12 MED ORDER — SULFAMETHOXAZOLE-TRIMETHOPRIM 800-160 MG PO TABS
ORAL_TABLET | ORAL | 1 refills | Status: DC
  Filled 2021-08-12: qty 20, 10d supply, fill #0

## 2021-08-12 MED ORDER — TRAMADOL HCL 50 MG PO TABS
50.0000 mg | ORAL_TABLET | ORAL | 1 refills | Status: DC | PRN
  Filled 2021-08-12: qty 30, 5d supply, fill #0

## 2021-08-12 NOTE — Progress Notes (Signed)
Patient Care Team: [name], MD as PCP - General (Family Medicine) Mauro Kaufmann, RN as Oncology Nurse Navigator Rockwell Germany, RN as Oncology Nurse Navigator  DIAGNOSIS:    ICD-10-CM   1. Ductal carcinoma in situ (DCIS) of right breast  D05.11       SUMMARY OF ONCOLOGIC HISTORY: Oncology History  Ductal carcinoma in situ (DCIS) of right breast  04/28/2021 Initial Diagnosis   Screening mammogram detected right breast mass at 4 o'clock position 10 cm from the nipple measuring 2.8 x 1.5 x 3.3 cm, axilla negative, biopsy revealed low to intermediate grade DCIS involving papillary lesion, ER/PR positive   05/26/2021 Surgery   (Dr. Brantley Stage): Right lumpectomy: Intermediate grade DCIS involving a papillary lesion 5.2 cm, no evidence of invasive cancer, resection margins are negative, ER 100%, PR 100%     CHIEF COMPLIANT: Follow-up of right breast DCIS  INTERVAL HISTORY: Misty [name] is a 46 y.o. with above-mentioned history of right breast DCIS having undergone right lumpectomy. She presents to the clinic today for follow-up.  Recent hospitalization with sepsis and pyelonephritis and kidney stones.  She is working with urology.  She has a stent in place and is currently on antibiotics.  She is awaiting removal of the stent.  ALLERGIES:  is allergic to dilaudid [hydromorphone hcl], ceftriaxone sodium, and latex.  MEDICATIONS:  Current Outpatient Medications  Medication Sig Dispense Refill   ALPRAZolam (XANAX) 1 MG tablet Take 1 tablet by mouth 4 times daily as needed 360 tablet 0   amphetamine-dextroamphetamine (ADDERALL) 20 MG tablet Take 1 tablet by mouth 3 times daily 270 tablet 0   fluconazole (DIFLUCAN) 150 MG tablet Take 1 tablet by mouth as needed. 2 tablet 0   montelukast (SINGULAIR) 10 MG tablet Take 1 tablet by mouth every evening (Patient taking differently: Take 10 mg by mouth at bedtime.) 90 tablet 0   omeprazole (PRILOSEC) 40 MG capsule TAKE 1 CAPSULE BY  MOUTH ONCE DAILY BEFORE BREAKFAST (Patient taking differently: Take 40 mg by mouth daily.) 30 capsule 10   ondansetron (ZOFRAN) 4 MG tablet Take 1 tablet by mouth daily as needed for nausea or vomiting. 30 tablet 1   oxybutynin (DITROPAN) 5 MG tablet Take 1 tablet (5 mg total) by mouth every 8 (eight) hours as needed. 60 tablet 1   oxyCODONE-acetaminophen (PERCOCET) 5-325 MG tablet Take 1 tablet by mouth every 4  hours as needed for severe pain. 30 tablet 0   sulfamethoxazole-trimethoprim (BACTRIM DS) 800-160 MG tablet Take 1 tablet by mouth every 12 hours 20 tablet 1   SUMAtriptan (IMITREX) 50 MG tablet Take 1 tablet by mouth at onset of headache followed by 1 tablet 2 hours later if symptoms persist 9 tablet 0   tamsulosin (FLOMAX) 0.4 MG CAPS capsule Take 1 capsule  by mouth daily. 15 capsule 0   traMADol (ULTRAM) 50 MG tablet Take 1 tablet (50 mg total) by mouth every 4-6 hours as needed for pain 30 tablet 1   triamcinolone ointment (KENALOG) 0.1 % APPLY A THIN LAYER TO THE AFFECTED AREA(S) OF SKIN 2 TIMES PER DAY (Patient not taking: Reported on 07/26/2021) 30 g 0   No current facility-administered medications for this visit.    PHYSICAL EXAMINATION: ECOG PERFORMANCE STATUS: 1 - Symptomatic but completely ambulatory  Vitals:   08/15/21 1044  BP: 131/78  Pulse: 67  Resp: 18  Temp: (!) 97.2 F (36.2 C)  SpO2: 100%   Filed Weights   08/15/21  1044  Weight: 203 lb 14.4 oz (92.5 kg)    BREAST: No palpable masses or nodules in either right or left breasts. No palpable axillary supraclavicular or infraclavicular adenopathy no breast tenderness or nipple discharge. (exam performed in the presence of a chaperone)  LABORATORY DATA:  I have reviewed the data as listed CMP Latest Ref Rng & Units 07/28/2021 07/27/2021 07/26/2021  Glucose 70 - 99 mg/dL 135(H) 159(H) 160(H)  BUN 6 - 20 mg/dL 22(H) 20 17  Creatinine 0.44 - 1.00 mg/dL 0.79 1.25(H) 1.56(H)  Sodium 135 - 145 mmol/L 136 135 135   Potassium 3.5 - 5.1 mmol/L 3.2(L) 3.6 3.1(L)  Chloride 98 - 111 mmol/L 107 107 108  CO2 22 - 32 mmol/L 23 21(L) 19(L)  Calcium 8.9 - 10.3 mg/dL 10.0 9.7 9.4  Total Protein 6.5 - 8.1 g/dL - 5.9(L) -  Total Bilirubin 0.3 - 1.2 mg/dL - 1.4(H) -  Alkaline Phos 38 - 126 U/L - 49 -  AST 15 - 41 U/L - 25 -  ALT 0 - 44 U/L - 24 -    Lab Results  Component Value Date   WBC 26.3 (H) 07/28/2021   HGB 11.6 (L) 07/28/2021   HCT 35.7 (L) 07/28/2021   MCV 79.5 (L) 07/28/2021   PLT 117 (L) 07/28/2021   NEUTROABS 5.0 07/25/2021    ASSESSMENT & PLAN:  Ductal carcinoma in situ (DCIS) of right breast 05/26/2021: (Dr. Brantley Stage): Right lumpectomy: Intermediate grade DCIS involving a papillary lesion 5.2 cm, no evidence of invasive cancer, resection margins are negative, ER 100%, PR 100%   Treatment plan: 1. adjuvant radiation therapy 07/27/2021-08/22/2021 2. Followed by antiestrogen therapy with tamoxifen 5 years.  I encouraged her to start the tamoxifen at 10 mg daily 09/17/2021  Sepsis with pyelonephritis and hydronephrosis: Stent was placed in the hospital.  She is still battling with urinary infections and currently is on antibiotics.   Tamoxifen counseling: We discussed the risks and benefits of tamoxifen. These include but not limited to insomnia, hot flashes, mood changes, vaginal dryness, and weight gain. Although rare, serious side effects including endometrial cancer, risk of blood clots were also discussed. We strongly believe that the benefits far outweigh the risks. Patient understands these risks and consented to starting treatment. Planned treatment duration is 5 years. We are using the 10 mg dosage because of the recent data showing lower doses of tamoxifen or equivalent and DCIS.  Return to clinic in 3 months for survivorship care plan visit    No orders of the defined types were placed in this encounter.  The patient has a good understanding of the overall plan. she agrees with it. she  will call with any problems that may develop before the next visit here.  Total time spent: 20 mins including face to face time and time spent for planning, charting and coordination of care  Rulon Eisenmenger, MD, MPH 08/15/2021  I, Thana Ates, am acting as scribe for Dr. Nicholas Lose.  I have reviewed the above documentation for accuracy and completeness, and I agree with the above.

## 2021-08-15 ENCOUNTER — Ambulatory Visit: Payer: 59

## 2021-08-15 ENCOUNTER — Inpatient Hospital Stay: Payer: 59 | Attending: Genetic Counselor | Admitting: Hematology and Oncology

## 2021-08-15 ENCOUNTER — Other Ambulatory Visit: Payer: Self-pay

## 2021-08-15 DIAGNOSIS — Z170 Estrogen receptor positive status [ER+]: Secondary | ICD-10-CM | POA: Insufficient documentation

## 2021-08-15 DIAGNOSIS — Z510 Encounter for antineoplastic radiation therapy: Secondary | ICD-10-CM | POA: Insufficient documentation

## 2021-08-15 DIAGNOSIS — D0511 Intraductal carcinoma in situ of right breast: Secondary | ICD-10-CM | POA: Insufficient documentation

## 2021-08-15 NOTE — Assessment & Plan Note (Signed)
05/26/2021: (Dr. Brantley Stage): Right lumpectomy: Intermediate grade DCIS involving a papillary lesion 5.2 cm, no evidence of invasive cancer, resection margins are negative, ER 100%, PR 100%  Treatment plan: 1. adjuvant radiation therapy 07/27/2021-08/22/2021 2. Followed by antiestrogen therapy with tamoxifen 5 years  Tamoxifen counseling: We discussed the risks and benefits of tamoxifen. These include but not limited to insomnia, hot flashes, mood changes, vaginal dryness, and weight gain. Although rare, serious side effects including endometrial cancer, risk of blood clots were also discussed. We strongly believe that the benefits far outweigh the risks. Patient understands these risks and consented to starting treatment. Planned treatment duration is 5 years.  Return to clinic in 3 months for survivorship care plan visit

## 2021-08-16 ENCOUNTER — Encounter (HOSPITAL_COMMUNITY): Payer: Self-pay

## 2021-08-16 ENCOUNTER — Ambulatory Visit: Payer: 59

## 2021-08-16 ENCOUNTER — Telehealth: Payer: Self-pay | Admitting: Hematology and Oncology

## 2021-08-16 ENCOUNTER — Other Ambulatory Visit: Payer: Self-pay | Admitting: Urology

## 2021-08-16 DIAGNOSIS — Z510 Encounter for antineoplastic radiation therapy: Secondary | ICD-10-CM | POA: Diagnosis not present

## 2021-08-16 DIAGNOSIS — Z170 Estrogen receptor positive status [ER+]: Secondary | ICD-10-CM | POA: Diagnosis not present

## 2021-08-16 DIAGNOSIS — D0511 Intraductal carcinoma in situ of right breast: Secondary | ICD-10-CM | POA: Diagnosis not present

## 2021-08-16 NOTE — Telephone Encounter (Signed)
Scheduled appointment per 2/27 los. Left voicemail. Patient will be mailed an updated calendar.

## 2021-08-17 ENCOUNTER — Ambulatory Visit
Admission: RE | Admit: 2021-08-17 | Discharge: 2021-08-17 | Disposition: A | Discharge status: Home / Self Care | Payer: 59 | Source: Ambulatory Visit | Attending: Radiation Oncology | Admitting: Radiation Oncology

## 2021-08-17 ENCOUNTER — Other Ambulatory Visit: Payer: Self-pay

## 2021-08-17 ENCOUNTER — Ambulatory Visit: Payer: 59

## 2021-08-17 DIAGNOSIS — Z170 Estrogen receptor positive status [ER+]: Secondary | ICD-10-CM | POA: Diagnosis not present

## 2021-08-17 DIAGNOSIS — D0511 Intraductal carcinoma in situ of right breast: Secondary | ICD-10-CM | POA: Insufficient documentation

## 2021-08-17 DIAGNOSIS — Z510 Encounter for antineoplastic radiation therapy: Secondary | ICD-10-CM | POA: Diagnosis not present

## 2021-08-18 ENCOUNTER — Ambulatory Visit: Payer: 59

## 2021-08-18 ENCOUNTER — Encounter: Payer: Self-pay | Admitting: *Deleted

## 2021-08-18 ENCOUNTER — Ambulatory Visit
Admission: RE | Admit: 2021-08-18 | Discharge: 2021-08-18 | Disposition: A | Discharge status: Home / Self Care | Payer: 59 | Source: Ambulatory Visit | Attending: Radiation Oncology | Admitting: Radiation Oncology

## 2021-08-18 DIAGNOSIS — Z510 Encounter for antineoplastic radiation therapy: Secondary | ICD-10-CM | POA: Diagnosis not present

## 2021-08-18 DIAGNOSIS — Z170 Estrogen receptor positive status [ER+]: Secondary | ICD-10-CM | POA: Diagnosis not present

## 2021-08-18 DIAGNOSIS — D0511 Intraductal carcinoma in situ of right breast: Secondary | ICD-10-CM | POA: Diagnosis not present

## 2021-08-19 ENCOUNTER — Ambulatory Visit
Admission: RE | Admit: 2021-08-19 | Discharge: 2021-08-19 | Disposition: A | Discharge status: Home / Self Care | Payer: 59 | Source: Ambulatory Visit | Attending: Radiation Oncology | Admitting: Radiation Oncology

## 2021-08-19 ENCOUNTER — Ambulatory Visit: Payer: 59

## 2021-08-19 ENCOUNTER — Other Ambulatory Visit: Payer: Self-pay

## 2021-08-19 DIAGNOSIS — D0511 Intraductal carcinoma in situ of right breast: Secondary | ICD-10-CM | POA: Diagnosis not present

## 2021-08-19 DIAGNOSIS — Z510 Encounter for antineoplastic radiation therapy: Secondary | ICD-10-CM | POA: Diagnosis not present

## 2021-08-19 DIAGNOSIS — Z170 Estrogen receptor positive status [ER+]: Secondary | ICD-10-CM | POA: Diagnosis not present

## 2021-08-22 ENCOUNTER — Encounter: Payer: Self-pay | Admitting: Radiation Oncology

## 2021-08-22 ENCOUNTER — Encounter (HOSPITAL_BASED_OUTPATIENT_CLINIC_OR_DEPARTMENT_OTHER): Payer: Self-pay | Admitting: Urology

## 2021-08-22 ENCOUNTER — Other Ambulatory Visit: Payer: Self-pay

## 2021-08-22 ENCOUNTER — Ambulatory Visit
Admission: RE | Admit: 2021-08-22 | Discharge: 2021-08-22 | Disposition: A | Discharge status: Home / Self Care | Payer: 59 | Source: Ambulatory Visit | Attending: Radiation Oncology | Admitting: Radiation Oncology

## 2021-08-22 DIAGNOSIS — Z170 Estrogen receptor positive status [ER+]: Secondary | ICD-10-CM | POA: Diagnosis not present

## 2021-08-22 DIAGNOSIS — D0511 Intraductal carcinoma in situ of right breast: Secondary | ICD-10-CM | POA: Diagnosis not present

## 2021-08-22 DIAGNOSIS — Z510 Encounter for antineoplastic radiation therapy: Secondary | ICD-10-CM | POA: Diagnosis not present

## 2021-08-23 ENCOUNTER — Encounter (HOSPITAL_BASED_OUTPATIENT_CLINIC_OR_DEPARTMENT_OTHER): Payer: Self-pay | Admitting: Urology

## 2021-08-23 ENCOUNTER — Other Ambulatory Visit: Payer: Self-pay

## 2021-08-23 DIAGNOSIS — L989 Disorder of the skin and subcutaneous tissue, unspecified: Secondary | ICD-10-CM

## 2021-08-23 HISTORY — DX: Disorder of the skin and subcutaneous tissue, unspecified: L98.9

## 2021-08-23 NOTE — Progress Notes (Addendum)
Spoke w/ via phone for pre-op interview---pt ?Lab needs dos---- urine preg            ?Lab results------bmet 07-28-2021 epic, ekg 07-28-2021 chart/epic, lov dr Lindi Adie oncology 08-15-2021 epic ?COVID test -----patient states asymptomatic no test needed ?Arrive at -------530 am 08-26-2021 ?NPO after MN NO Solid Food.  Clear liquids from MN until---430 am ?Med rec completed ?Medications to take morning of surgery -----omeprazole ?Diabetic medication -----n/a ?Patient instructed no nail polish to be worn day of surgery ?Patient instructed to bring photo id and insurance card day of surgery ?Patient aware to have Driver (ride ) / caregiver  friend patricia pierce cell [phone]   for 24 hours after surgery  ?Patient Special Instructions -----none ?Pre-Op special Istructions -----none ?Patient verbalized understanding of instructions that were given at this phone interview. ?Patient denies shortness of breath, chest pain, fever, cough at this phone interview.  ?

## 2021-08-25 ENCOUNTER — Other Ambulatory Visit (HOSPITAL_COMMUNITY): Payer: Self-pay

## 2021-08-25 MED ORDER — OXYCODONE HCL 5 MG PO TABS
ORAL_TABLET | ORAL | 0 refills | Status: DC
  Filled 2021-08-25: qty 10, 3d supply, fill #0

## 2021-08-25 NOTE — Anesthesia Preprocedure Evaluation (Addendum)
Anesthesia Evaluation  ?Patient identified by MRN, date of birth, ID band ? ?Reviewed: ?Allergy & Precautions, NPO status , Patient's Chart, lab work & pertinent test results ? ?Airway ?Mallampati: II ? ?TM Distance: >3 FB ?Neck ROM: Full ? ? ? Dental ? ?(+) Dental Advisory Given, Partial Upper,  ?  ?Pulmonary ?neg pulmonary ROS,  ?  ?Pulmonary exam normal ?breath sounds clear to auscultation ? ? ? ? ? ? Cardiovascular ?negative cardio ROS ? ? ?Rhythm:Regular Rate:Tachycardia ? ? ?  ?Neuro/Psych ? Headaches, PSYCHIATRIC DISORDERS Anxiety   ? GI/Hepatic ?Neg liver ROS, GERD  Medicated,  ?Endo/Other  ?negative endocrine ROShyperparathyroidism ? Renal/GU ?Renal diseasenephrolithiasis  ?negative genitourinary ?  ?Musculoskeletal ?negative musculoskeletal ROS ?(+)  ? Abdominal ?(+) + obese,   ?Peds ?negative pediatric ROS ?(+) ADHD Hematology ? ?(+) Blood dyscrasia, anemia ,   ?Anesthesia Other Findings ?H/o right breast cancer ? Reproductive/Obstetrics ?negative OB ROS ? ?  ? ? ? ? ? ? ? ? ? ? ? ? ? ?  ?  ? ? ? ? ? ? ? ?Anesthesia Physical ?Anesthesia Plan ? ?ASA: 2 ? ?Anesthesia Plan: General  ? ?Post-op Pain Management:   ? ?Induction: Intravenous ? ?PONV Risk Score and Plan: 3 and Treatment may vary due to age or medical condition, Scopolamine patch - Pre-op, Midazolam, Dexamethasone and Ondansetron ? ?Airway Management Planned: LMA ? ?Additional Equipment: None ? ?Intra-op Plan:  ? ?Post-operative Plan: Extubation in OR ? ?Informed Consent: I have reviewed the patients History and Physical, chart, labs and discussed the procedure including the risks, benefits and alternatives for the proposed anesthesia with the patient or authorized representative who has indicated his/her understanding and acceptance.  ? ? ? ?Dental advisory given ? ?Plan Discussed with: Anesthesiologist, CRNA and Surgeon ? ?Anesthesia Plan Comments:   ? ? ? ? ? ?Anesthesia Quick Evaluation ? ?

## 2021-08-25 NOTE — Progress Notes (Signed)
? ?                                                                                                                                                          ?  Patient Name: Misty [name] ?MRN: [id number] ?DOB: 1975-09-04 ?Referring Physician: WHITE CYNTHIA (Profile Not Attached) ?Date of Service: 08/22/2021 ?[facility] [place] ? ?                                                      End Of Treatment Note ? ?Diagnoses: D05.11-Intraductal carcinoma in situ of right breast ? ?Cancer Staging:  Intermediate grade, ER/PR positive DCIS of the right breast. ? ?Intent: Curative ? ?Radiation Treatment Dates: 07/05/2021 through 08/22/2021 ?Site Technique Total Dose (Gy) Dose per Fx (Gy) Completed Fx Beam Energies  ?Breast, Right: Breast_R 3D 50.4/50.4 1.8 28/28 6XFFF  ?Breast, Right: Breast_R_Bst 3D 10/10 2 5/5 6X, 10X  ? ?Narrative: The patient tolerated radiation therapy relatively well. She developed fatigue and skin changes in the treatment field. At the end of therapy, she did have dry desquamation but no moist changes. ? ? ?Plan: The patient will receive a call in about one month from the radiation oncology department. She will continue follow up with Dr. Lindi Adie as well.  ? ?________________________________________________ ? ? ? ?Carola Rhine, PAC  ?

## 2021-08-26 ENCOUNTER — Ambulatory Visit (HOSPITAL_BASED_OUTPATIENT_CLINIC_OR_DEPARTMENT_OTHER)
Admission: RE | Admit: 2021-08-26 | Discharge: 2021-08-26 | Disposition: A | Discharge status: Home / Self Care | Payer: 59 | Attending: Urology | Admitting: Urology

## 2021-08-26 ENCOUNTER — Ambulatory Visit (HOSPITAL_BASED_OUTPATIENT_CLINIC_OR_DEPARTMENT_OTHER): Payer: 59 | Admitting: Anesthesiology

## 2021-08-26 ENCOUNTER — Encounter (HOSPITAL_BASED_OUTPATIENT_CLINIC_OR_DEPARTMENT_OTHER)
Admission: RE | Disposition: A | Discharge status: Home / Self Care | Payer: Self-pay | Source: Home / Self Care | Attending: Urology

## 2021-08-26 ENCOUNTER — Encounter (HOSPITAL_BASED_OUTPATIENT_CLINIC_OR_DEPARTMENT_OTHER): Payer: Self-pay | Admitting: Urology

## 2021-08-26 ENCOUNTER — Other Ambulatory Visit (HOSPITAL_COMMUNITY): Payer: Self-pay

## 2021-08-26 ENCOUNTER — Other Ambulatory Visit: Payer: Self-pay

## 2021-08-26 DIAGNOSIS — Z853 Personal history of malignant neoplasm of breast: Secondary | ICD-10-CM | POA: Diagnosis not present

## 2021-08-26 DIAGNOSIS — R519 Headache, unspecified: Secondary | ICD-10-CM | POA: Diagnosis not present

## 2021-08-26 DIAGNOSIS — Z466 Encounter for fitting and adjustment of urinary device: Secondary | ICD-10-CM | POA: Diagnosis not present

## 2021-08-26 DIAGNOSIS — F419 Anxiety disorder, unspecified: Secondary | ICD-10-CM | POA: Insufficient documentation

## 2021-08-26 DIAGNOSIS — K219 Gastro-esophageal reflux disease without esophagitis: Secondary | ICD-10-CM | POA: Diagnosis not present

## 2021-08-26 DIAGNOSIS — N202 Calculus of kidney with calculus of ureter: Secondary | ICD-10-CM | POA: Diagnosis not present

## 2021-08-26 DIAGNOSIS — F909 Attention-deficit hyperactivity disorder, unspecified type: Secondary | ICD-10-CM | POA: Insufficient documentation

## 2021-08-26 DIAGNOSIS — N201 Calculus of ureter: Secondary | ICD-10-CM | POA: Diagnosis not present

## 2021-08-26 HISTORY — PX: HOLMIUM LASER APPLICATION: SHX5852

## 2021-08-26 HISTORY — PX: CYSTOSCOPY WITH RETROGRADE PYELOGRAM, URETEROSCOPY AND STENT PLACEMENT: SHX5789

## 2021-08-26 HISTORY — DX: Presence of dental prosthetic device (complete) (partial): Z97.2

## 2021-08-26 HISTORY — DX: Personal history of irradiation: Z92.3

## 2021-08-26 HISTORY — DX: Sepsis, unspecified organism: A41.9

## 2021-08-26 HISTORY — DX: Personal history of other diseases of the digestive system: Z87.19

## 2021-08-26 LAB — POCT PREGNANCY, URINE: Preg Test, Ur: NEGATIVE

## 2021-08-26 SURGERY — CYSTOURETEROSCOPY, WITH RETROGRADE PYELOGRAM AND STENT INSERTION
Anesthesia: General | Site: Ureter | Laterality: Right

## 2021-08-26 MED ORDER — FENTANYL CITRATE (PF) 100 MCG/2ML IJ SOLN
INTRAMUSCULAR | Status: AC
  Filled 2021-08-26: qty 2

## 2021-08-26 MED ORDER — FENTANYL CITRATE (PF) 250 MCG/5ML IJ SOLN
INTRAMUSCULAR | Status: AC
  Filled 2021-08-26: qty 5

## 2021-08-26 MED ORDER — MIDAZOLAM HCL 5 MG/5ML IJ SOLN
INTRAMUSCULAR | Status: DC | PRN
  Administered 2021-08-26: 2 mg via INTRAVENOUS

## 2021-08-26 MED ORDER — ONDANSETRON HCL 4 MG/2ML IJ SOLN
INTRAMUSCULAR | Status: DC | PRN
  Administered 2021-08-26: 4 mg via INTRAVENOUS

## 2021-08-26 MED ORDER — KETOROLAC TROMETHAMINE 30 MG/ML IJ SOLN
INTRAMUSCULAR | Status: AC
  Filled 2021-08-26: qty 1

## 2021-08-26 MED ORDER — ONDANSETRON HCL 4 MG/2ML IJ SOLN: 4.0000 mg | Freq: Once | INTRAMUSCULAR | Status: DC | PRN

## 2021-08-26 MED ORDER — 0.9 % SODIUM CHLORIDE (POUR BTL) OPTIME
TOPICAL | Status: DC | PRN
  Administered 2021-08-26: 500 mL

## 2021-08-26 MED ORDER — CIPROFLOXACIN IN D5W 400 MG/200ML IV SOLN
400.0000 mg | INTRAVENOUS | Status: AC
  Administered 2021-08-26: 400 mg via INTRAVENOUS

## 2021-08-26 MED ORDER — SCOPOLAMINE 1 MG/3DAYS TD PT72
1.0000 | MEDICATED_PATCH | TRANSDERMAL | Status: DC
  Administered 2021-08-26: 1.5 mg via TRANSDERMAL

## 2021-08-26 MED ORDER — PROPOFOL 10 MG/ML IV BOLUS
INTRAVENOUS | Status: AC
  Filled 2021-08-26: qty 20

## 2021-08-26 MED ORDER — DEXAMETHASONE SODIUM PHOSPHATE 10 MG/ML IJ SOLN
INTRAMUSCULAR | Status: DC | PRN
Start: 2021-08-26 — End: 2021-08-26
  Administered 2021-08-26: 10 mg via INTRAVENOUS

## 2021-08-26 MED ORDER — ACETAMINOPHEN 500 MG PO TABS
ORAL_TABLET | ORAL | Status: AC
  Filled 2021-08-26: qty 2

## 2021-08-26 MED ORDER — CIPROFLOXACIN IN D5W 400 MG/200ML IV SOLN
INTRAVENOUS | Status: AC
  Filled 2021-08-26: qty 200

## 2021-08-26 MED ORDER — AMISULPRIDE (ANTIEMETIC) 5 MG/2ML IV SOLN
INTRAVENOUS | Status: AC
  Filled 2021-08-26: qty 4

## 2021-08-26 MED ORDER — MIDAZOLAM HCL 2 MG/2ML IJ SOLN
INTRAMUSCULAR | Status: AC
  Filled 2021-08-26: qty 2

## 2021-08-26 MED ORDER — KETOROLAC TROMETHAMINE 30 MG/ML IJ SOLN
INTRAMUSCULAR | Status: DC | PRN
  Administered 2021-08-26: 30 mg via INTRAVENOUS

## 2021-08-26 MED ORDER — AMISULPRIDE (ANTIEMETIC) 5 MG/2ML IV SOLN
10.0000 mg | Freq: Once | INTRAVENOUS | Status: AC | PRN
  Administered 2021-08-26: 10 mg via INTRAVENOUS

## 2021-08-26 MED ORDER — SODIUM CHLORIDE 0.9 % IR SOLN
Status: DC | PRN
  Administered 2021-08-26: 3000 mL

## 2021-08-26 MED ORDER — LIDOCAINE 2% (20 MG/ML) 5 ML SYRINGE
INTRAMUSCULAR | Status: DC | PRN
  Administered 2021-08-26: 80 mg via INTRAVENOUS

## 2021-08-26 MED ORDER — LACTATED RINGERS IV SOLN: INTRAVENOUS | Status: DC

## 2021-08-26 MED ORDER — FENTANYL CITRATE (PF) 100 MCG/2ML IJ SOLN
INTRAMUSCULAR | Status: DC | PRN
  Administered 2021-08-26 (×3): 50 ug via INTRAVENOUS

## 2021-08-26 MED ORDER — FENTANYL CITRATE (PF) 100 MCG/2ML IJ SOLN
25.0000 ug | INTRAMUSCULAR | Status: DC | PRN
  Administered 2021-08-26: 25 ug via INTRAVENOUS

## 2021-08-26 MED ORDER — ACETAMINOPHEN 500 MG PO TABS
1000.0000 mg | ORAL_TABLET | Freq: Once | ORAL | Status: AC
  Administered 2021-08-26: 1000 mg via ORAL

## 2021-08-26 MED ORDER — PROPOFOL 10 MG/ML IV BOLUS
INTRAVENOUS | Status: DC | PRN
  Administered 2021-08-26: 150 mg via INTRAVENOUS

## 2021-08-26 MED ORDER — SCOPOLAMINE 1 MG/3DAYS TD PT72
MEDICATED_PATCH | TRANSDERMAL | Status: AC
  Filled 2021-08-26: qty 1

## 2021-08-26 MED ORDER — CIPROFLOXACIN HCL 500 MG PO TABS
500.0000 mg | ORAL_TABLET | Freq: Two times a day (BID) | ORAL | 0 refills | Status: AC
  Filled 2021-08-26: qty 6, 3d supply, fill #0

## 2021-08-26 SURGICAL SUPPLY — 26 items
BAG DRAIN URO-CYSTO SKYTR STRL (DRAIN) ×3 IMPLANT
BAG DRN UROCATH (DRAIN) ×1
CATH INTERMIT  6FR 70CM (CATHETERS) ×3 IMPLANT
CLOTH BEACON ORANGE TIMEOUT ST (SAFETY) ×3 IMPLANT
COVER DOME SNAP 22 D (MISCELLANEOUS) ×3 IMPLANT
EXTRACTOR STONE 1.7FRX115CM (UROLOGICAL SUPPLIES) ×2 IMPLANT
FIBER LASER FLEXIVA 365 (UROLOGICAL SUPPLIES) ×2 IMPLANT
GLOVE SURG ENC MOIS LTX SZ8 (GLOVE) ×3 IMPLANT
GLOVE SURG LTX SZ6.5 (GLOVE) ×2 IMPLANT
GLOVE SURG POLYISO LF SZ8.5 (GLOVE) ×2 IMPLANT
GLOVE SURG UNDER POLY LF SZ7 (GLOVE) ×2 IMPLANT
GLOVE SURG UNDER POLY LF SZ8.5 (GLOVE) ×2 IMPLANT
GOWN STRL REUS W/ TWL LRG LVL3 (GOWN DISPOSABLE) IMPLANT
GOWN STRL REUS W/TWL LRG LVL3 (GOWN DISPOSABLE) ×3
GOWN STRL REUS W/TWL XL LVL3 (GOWN DISPOSABLE) ×5 IMPLANT
GUIDEWIRE STR DUAL SENSOR (WIRE) ×4 IMPLANT
IV NS IRRIG 3000ML ARTHROMATIC (IV SOLUTION) ×4 IMPLANT
KIT TURNOVER CYSTO (KITS) ×3 IMPLANT
MANIFOLD NEPTUNE II (INSTRUMENTS) ×3 IMPLANT
NS IRRIG 500ML POUR BTL (IV SOLUTION) ×3 IMPLANT
PACK CYSTO (CUSTOM PROCEDURE TRAY) ×3 IMPLANT
SHEATH URETERAL 12FRX28CM (UROLOGICAL SUPPLIES) ×2 IMPLANT
STENT URET 6FRX26 CONTOUR (STENTS) ×2 IMPLANT
TUBE CONNECTING 12'X1/4 (SUCTIONS) ×1
TUBE CONNECTING 12X1/4 (SUCTIONS) ×1 IMPLANT
TUBING UROLOGY SET (TUBING) ×2 IMPLANT

## 2021-08-26 NOTE — Anesthesia Procedure Notes (Signed)
Procedure Name: LMA Insertion ?Date/Time: 08/26/2021 7:33 AM ?Performed by: Gwyndolyn Saxon, CRNA ?Pre-anesthesia Checklist: Patient identified, Emergency Drugs available, Suction available and Patient being monitored ?Patient Re-evaluated:Patient Re-evaluated prior to induction ?Oxygen Delivery Method: Circle system utilized ?Preoxygenation: Pre-oxygenation with 100% oxygen ?Induction Type: IV induction ?Ventilation: Mask ventilation without difficulty ?LMA: LMA inserted ?LMA Size: 4.0 ?Number of attempts: 1 ?Airway Equipment and Method: Patient positioned with wedge pillow ?Placement Confirmation: positive ETCO2 and breath sounds checked- equal and bilateral ?Tube secured with: Tape ?Dental Injury: Teeth and Oropharynx as per pre-operative assessment  ? ? ? ? ?

## 2021-08-26 NOTE — H&P (Signed)
H&P ? ?Chief Complaint: Right-sided kidney stones ? ?History of Present Illness: Misty [name] is a 46 y.o. year old female presenting for ureteroscopic management of right ureteral and renal calculi.  She initially presented earlier this year to the emergency room with a fever and right flank pain.  She had an obstructing right ureteral stone, a large right lower pole stone and sepsis.  She was treated with urgent stenting and antibiotic management.  She presents at this time for definitive stone management. ? ?Past Medical History:  ?Diagnosis Date  ? ADHD (attention deficit hyperactivity disorder)   ? Anemia   ? Environmental and seasonal allergies   ? Frequency of urination   ? GAD (generalized anxiety disorder)   ? GERD (gastroesophageal reflux disease)   ? History of COVID-19 06/12/2020  ? result in epic;  per pt mild symptoms that resolved  ? History of hiatal hernia   ? History of kidney stones   ? History of radiation therapy   ? 07-27-2021 to 08-22-2021  ? Hyperparathyroidism, primary ([place])   ? currently followed by pcp  (previously seen endocrinologist-- dr Louie Boston note in epic 08-29-2017, doctor moved away)  ? Right Breast cancer ([place]) 04/28/2021  ? Sepsis ([place])   ? admit 07-26-2021  ? Skin abnormalities 08/23/2021  ? right breast areas from radiation top of breast, around nipple and under right breast healing well  ? Tenosynovitis, de Quervain   ? left wrist  ? Wears glasses   ? Wears partial dentures upper flipper   ? ? ?Past Surgical History:  ?Procedure Laterality Date  ? BREAST LUMPECTOMY WITH RADIOACTIVE SEED LOCALIZATION Right 05/26/2021  ? Procedure: RIGHT BREAST LUMPECTOMY WITH RADIOACTIVE SEED LOCALIZATION;  Surgeon: Erroll Luna, MD;  Location: [facility];  Service: General;  Laterality: Right;  ? CYSTO W/ RIGHT URETEROSCOPY  2006  ? CYSTO/ RIGHT URETERAL STENT PLACEMENT  07-29-2009  ? LARGE RIGHT RENAL CALCULUS  ? CYSTOLITHOLAPAXY OF LARGE BLADDER CALCULI/   RIGHT URETERAL  STENT EXCHANGE  04-15-2010  ? CYSTOSCOPY WITH STENT PLACEMENT  07/04/2012  ? Procedure: CYSTOSCOPY WITH STENT PLACEMENT;  Surgeon: Franchot Gallo, MD;  Location: [facility];  Service: Urology;  Laterality: Right;  rt dbl j stent placement   ? CYSTOSCOPY WITH STENT PLACEMENT Right 07/26/2021  ? Procedure: CYSTOSCOPY WITH STENT PLACEMENT;  Surgeon: Ardis Hughs, MD;  Location: [facility];  Service: Urology;  Laterality: Right;  ? CYSTOSCOPY/RETROGRADE/URETEROSCOPY/STONE EXTRACTION WITH BASKET  07/25/2012  ? Procedure: CYSTOSCOPY/RETROGRADE/URETEROSCOPY/STONE EXTRACTION WITH BASKET;  Surgeon: Franchot Gallo, MD;  Location: [facility];  Service: Urology;  Laterality: Right;  ? EXTRACORPOREAL SHOCK WAVE LITHOTRIPSY  08-02-2009  ? RIGHT  ? HOLMIUM LASER APPLICATION  01/19/9936  ? Procedure: HOLMIUM LASER APPLICATION;  Surgeon: Franchot Gallo, MD;  Location: [facility];  Service: Urology;  Laterality: Right;  ? LAPAROSCOPIC TUBAL LIGATION Bilateral 01/17/2013  ? Procedure: LAPAROSCOPIC TUBAL LIGATION;  Surgeon: Melina Schools, MD;  Location: [facility];  Service: Gynecology;  Laterality: Bilateral;  1hr OR time   ? MINOR RELEASE DORSAL COMPARTMENT (DEQUERVAINS) Left 04/26/2021  ? Procedure: MINOR RELEASE DORSAL COMPARTMENT (DEQUERVAINS) WITH GANGLION EXCISION;  Surgeon: Renette Butters, MD;  Location: [facility];  Service: Orthopedics;  Laterality: Left;  ? ? ?Home Medications:  ?Medications Prior to Admission  ?Medication Sig Dispense Refill  ? ALPRAZolam (XANAX) 1 MG tablet Take 1 tablet by mouth 4 times daily as needed 360 tablet 0  ?  fluconazole (DIFLUCAN) 150 MG tablet Take 1 tablet by mouth as needed. 2 tablet 0  ? montelukast (SINGULAIR) 10 MG tablet Take 1 tablet by mouth every evening (Patient taking differently: Take 10 mg by mouth as needed.) 90 tablet 0  ? omeprazole (PRILOSEC) 40 MG capsule TAKE 1 CAPSULE BY MOUTH ONCE DAILY BEFORE BREAKFAST  (Patient taking differently: Take 40 mg by mouth as needed.) 30 capsule 10  ? oxyCODONE (OXY IR/ROXICODONE) 5 MG immediate release tablet Take 1 tablet by mouth every 8 hours as needed for severe pain 10 tablet 0  ? oxyCODONE-acetaminophen (PERCOCET) 5-325 MG tablet Take 1 tablet by mouth every 4  hours as needed for severe pain. 30 tablet 0  ? sulfamethoxazole-trimethoprim (BACTRIM DS) 800-160 MG tablet Take 1 tablet by mouth every 12 hours (Patient taking differently: Restarting  today take for 3 days before surgery per dr Diona Fanti) 20 tablet 1  ? traMADol (ULTRAM) 50 MG tablet Take 1 tablet (50 mg total) by mouth every 4-6 hours as needed for pain 30 tablet 1  ? Wound Cleansers (RADIAPLEX EX) Apply topically daily. To right breast    ? amphetamine-dextroamphetamine (ADDERALL) 20 MG tablet Take 1 tablet by mouth 3 times daily 270 tablet 0  ? ondansetron (ZOFRAN) 4 MG tablet Take 1 tablet by mouth daily as needed for nausea or vomiting. (Patient not taking: Reported on 08/26/2021) 30 tablet 1  ? oxybutynin (DITROPAN) 5 MG tablet Take 1 tablet (5 mg total) by mouth every 8 (eight) hours as needed. (Patient not taking: Reported on 08/23/2021) 60 tablet 1  ? SUMAtriptan (IMITREX) 50 MG tablet Take 1 tablet by mouth at onset of headache followed by 1 tablet 2 hours later if symptoms persist 9 tablet 0  ? tamsulosin (FLOMAX) 0.4 MG CAPS capsule Take 1 capsule  by mouth daily. (Patient taking differently: Take 0.4 mg by mouth as needed.) 15 capsule 0  ? triamcinolone ointment (KENALOG) 0.1 % APPLY A THIN LAYER TO THE AFFECTED AREA(S) OF SKIN 2 TIMES PER DAY (Patient taking differently: Apply topically as needed.) 30 g 0  ? ? ?Allergies:  ?Allergies  ?Allergen Reactions  ? Dilaudid [Hydromorphone Hcl] Shortness Of Breath and Nausea And Vomiting  ?  Pt has takes percocet at home (see med rec).  ? Ceftriaxone Sodium Other (See Comments)  ?  Hair loss with rocephrin  ? Latex Rash  ? ? ?Family History  ?Problem Relation Age of  Onset  ? Breast cancer Maternal Aunt 61  ?     DCIS  ? Esophageal cancer Maternal Uncle   ? Stomach cancer Neg Hx   ? Colon cancer Neg Hx   ? Pancreatic cancer Neg Hx   ? ? ?Social History:  reports that she has never smoked. She has never used smokeless tobacco. She reports that she does not currently use alcohol. She reports that she does not use drugs. ? ?ROS: ?A complete review of systems was performed.  All systems are negative except for pertinent findings as noted. ? ?Physical Exam:  ?Vital signs in last 24 hours: ?Temp:  [97.3 ?F (36.3 ?C)] 97.3 ?F (36.3 ?C) (03/10 0600) ?Pulse Rate:  [86] 86 (03/10 0600) ?Resp:  [18] 18 (03/10 0600) ?BP: (133)/(86) 133/86 (03/10 0600) ?SpO2:  [100 %] 100 % (03/10 0600) ?Weight:  [92.9 kg] 92.9 kg (03/10 0600) ?General:  Alert and oriented, No acute distress ?HEENT: Normocephalic, atraumatic ?Neck: No JVD or lymphadenopathy ?Cardiovascular: Regular rate  ?Lungs: Normal inspiratory/expiratory excursion ?Abdomen:  Soft, nontender, nondistended, no abdominal masses ?Back: No CVA tenderness ?Extremities: No edema ?Neurologic: Grossly intact ? ?I have reviewed prior pt notes ? ?I have reviewed notes from referring/previous physicians ? ?I have reviewed urinalysis results ? ?I have independently reviewed prior imaging ? ?I have reviewed prior urine culture  ? ?Impression/Assessment:  ?Right ureteral and renal calculi, status post stenting/antibiotic management ? ?Plan:  ?Cystoscopy, right double-J stent extraction, right ureteroscopy, laser management of right ureteral and renal calculi with stent replacement ? ?Lillette Boxer Braxdon Gappa ?08/26/2021, 7:21 AM  ?Lillette Boxer. Atley Neubert MD ? ? ?

## 2021-08-26 NOTE — Anesthesia Postprocedure Evaluation (Signed)
Anesthesia Post Note ? ?Patient: Misty [name] ? ?Procedure(s) Performed: CYSTOSCOPY, URETEROSCOPY AND STENT  EXCHANGE, EXTRACTION OF RIGHT URETERAL STONE, FLUOROSCOPIC INTERPRETATION (Right: Ureter) ?HOLMIUM LASER APPLICATION (Right: Ureter) ? ?  ? ?Patient location during evaluation: PACU ?Anesthesia Type: General ?Level of consciousness: awake ?Pain management: pain level controlled ?Vital Signs Assessment: post-procedure vital signs reviewed and stable ?Respiratory status: spontaneous breathing and respiratory function stable ?Cardiovascular status: stable ?Postop Assessment: no apparent nausea or vomiting ?Anesthetic complications: no ? ? ?No notable events documented. ? ?Last Vitals:  ?Vitals:  ? 08/26/21 0930 08/26/21 1008  ?BP: 140/81 134/75  ?Pulse: 86 82  ?Resp: 18 20  ?Temp:  36.6 ?C  ?SpO2: 98% 99%  ?  ?Last Pain:  ?Vitals:  ? 08/26/21 1008  ?TempSrc:   ?PainSc: 0-No pain  ? ? ?  ?  ?  ?  ?  ?  ? ?Merlinda Frederick ? ? ? ? ?

## 2021-08-26 NOTE — OR Nursing (Signed)
Right ureteral stent was removed by Dr. Diona Fanti. ?

## 2021-08-26 NOTE — Discharge Instructions (Addendum)
You may see some blood in the urine and may have some burning with urination for 48-72 hours. You also may notice that you have to urinate more frequently or urgently after your procedure which is normal.  ?You should call should you develop an inability urinate, fever > 101, persistent nausea and vomiting that prevents you from eating or drinking to stay hydrated.  ?If you have a stent, you will likely urinate more frequently and urgently until the stent is removed and you may experience some discomfort/pain in the lower abdomen and flank especially when urinating. You may take pain medication prescribed to you if needed for pain. You may also intermittently have blood in the urine until the stent is removed.  It is okay to remove your stent by pulling on the thread Monday morning. ? ? ? ?Post Anesthesia Home Care Instructions ? ?Activity: ?Get plenty of rest for the remainder of the day. A responsible individual must stay with you for 24 hours following the procedure.  ?For the next 24 hours, DO NOT: ?-Drive a car ?-Paediatric nurse ?-Drink alcoholic beverages ?-Take any medication unless instructed by your physician ?-Make any legal decisions or sign important papers. ? ?Meals: ?Start with liquid foods such as gelatin or soup. Progress to regular foods as tolerated. Avoid greasy, spicy, heavy foods. If nausea and/or vomiting occur, drink only clear liquids until the nausea and/or vomiting subsides. Call your physician if vomiting continues. ? ?Special Instructions/Symptoms: ?Your throat may feel dry or sore from the anesthesia or the breathing tube placed in your throat during surgery. If this causes discomfort, gargle with warm salt water. The discomfort should disappear within 24 hours. ? ?If you had a scopolamine patch placed behind your ear for the management of post- operative nausea and/or vomiting: ? ?1. The medication in the patch is effective for 72 hours, after which it should be removed.  Wrap patch  in a tissue and discard in the trash. Wash hands thoroughly with soap and water. ?2. You may remove the patch earlier than 72 hours if you experience unpleasant side effects which may include dry mouth, dizziness or visual disturbances. ?3. Avoid touching the patch. Wash your hands with soap and water after contact with the patch. ? ?Remove patch behind left ear by Monday, August 29, 2021. ? ?May take Tylenol as needed beginning at 4 PM for pain. ? ?[facility] ?[phone] ?Post Ureteroscopy With Stent Instructions ? ?Definitions: ? ?Ureter: The duct that transports urine from the kidney to the bladder. ?Stent:   A plastic hollow tube that is placed into the ureter, from the kidney to the bladder to prevent the ureter from swelling shut. ? ?GENERAL INSTRUCTIONS: ? ?Despite the fact that no skin incisions were used, the area around the ureter and bladder is raw and irritated. The stent is a foreign body which will further irritate the bladder wall. This irritation is manifested by increased frequency of urination, both day and night, and by an increase in the urge to urinate. In some, the urge to urinate is present almost always. Sometimes the urge is strong enough that you may not be able to stop yourself from urinating. The only real cure is to remove the stent and then give time for the bladder wall to heal which can't be done until the danger of the ureter swelling shut has passed, which varies. ? ?You may see some blood in your urine while the stent is in place and a few days afterwards.  Do not be alarmed, even if the urine was clear for a while. Get off your feet and drink lots of fluids until clearing occurs. If you start to pass clots or don't improve, call us. ? ?DIET: ?You may return to your normal diet immediately. Because of the raw surface of your bladder, alcohol, spicy foods, acid type foods and drinks with caffeine may cause irritation or frequency and should be used in moderation.  To keep your urine flowing freely and to avoid constipation, drink plenty of fluids during the day ( 8-10 glasses ). ?Tip: Avoid cranberry juice because it is very acidic. ? ?ACTIVITY: ?Your physical activity doesn't need to be restricted. However, if you are very active, you may see some blood in your urine. We suggest that you reduce your activity under these circumstances until the bleeding has stopped. ? ?BOWELS: ?It is important to keep your bowels regular during the postoperative period. Straining with bowel movements can cause bleeding. A bowel movement every other day is reasonable. Use a mild laxative if needed, such as Milk of Magnesia 2-3 tablespoons, or 2 Dulcolax tablets. Call if you continue to have problems. If you have been taking narcotics for pain, before, during or after your surgery, you may be constipated. Take a laxative if necessary. ? ? ?MEDICATION: ?You should resume your pre-surgery medications unless told not to. In addition you will often be given an antibiotic to prevent infection. These should be taken as prescribed until the bottles are finished unless you are having an unusual reaction to one of the drugs. ? ?PROBLEMS YOU SHOULD REPORT TO [place]: ?Fevers over 100.5 Fahrenheit. ?Heavy bleeding, or clots ( See above notes about blood in urine ). ?Inability to urinate. ?Drug reactions ( hives, rash, nausea, vomiting, diarrhea ). ?Severe burning or pain with urination that is not improving. ? ?FOLLOW-UP: ?You will need a follow-up appointment to monitor your progress. Call for this appointment at the number listed above. Usually the first appointment will be about three to fourteen days after your surgery. ? ? ? ?  ?    ?

## 2021-08-26 NOTE — Op Note (Signed)
Preoperative diagnosis: Right mid ureteral and lower calyceal calculi ? ?Postoperative diagnosis: Same ? ?Principal procedure: Cystoscopy, right double-J stent exchange, right ureteroscopy, extraction of right ureteral stone, laser lithotripsy of right lower calyceal stones, fluoroscopic interpretation ? ?Surgeon: Diona Fanti ? ?Specimen: Stone ? ?Estimated blood loss: None ? ?Complications: None ? ?Drains: None ? ?Indications: 46 year old female presents at this time for management of right ureteral and renal calculi.  She presented earlier this year with an obstructing right ureteral stone, UTI and sepsis.  Urgent stenting performed, antibiotic management given and she presents at this time, with infection having been treated, for definitive management.  She has had prior intervention for stones with ureteroscopy.  She understands the procedure, expected outcome as well as risks and complications which include but are not limited to needing stent again, stent symptoms, infection, blood in urine, anesthetic complications, ureteral injury, among others.  She understands and desires to proceed. ? ?Findings: Bladder was normal.  Orifices were in normal location.  There was a stent at the right ureter.  There was moderate inflammatory change of the ureter secondary to the ureteral stone.  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  No other calculi were noted. ? ?Description of procedure: The patient was properly identified and marked in the holding area patient was taken to the operating room where general anesthetic was administered with the LMA.  She was placed in the dorsolithotomy position.  Genitalia and perineum were prepped, draped, proper timeout performed. ? ?24 French panendoscope was placed in the bladder, inspection performed, stent grasped and brought out through the meatus.  The stent was cannulated with a sensor tip guidewire which was easily advanced up into the upper pole  calyceal system on the right.  Stent was then removed.  The distal ureter was then dilated with the 13 French ureteral access catheter.  This was then removed and a semirigid short dual-lumen ureteroscope was advanced up to the stone through the ureter.  Stone was identified, grasped with a an engage basket and easily extracted and saved for specimen.  No other ureteral calculi were noted.  I then passed the ureteral access catheter, of medium length, as well as a safety wire.  I then negotiated the flexible dual-lumen digital ureteroscope into the upper pole calyceal system.  No stones were seen.  There was a separate lower pole infundibulum.  This was entered, there were 2 stones in the lower pole calyx, 1 smaller, 1 approximately 10 to 11 mm in size.  These were dusted with holmium laser energy using a 365 ?m fiber with settings of 0.8 J and 25 Hz.  Mainly, powder like residue was produced.  No significant granules or fragments were noted at this point.  Most of these had rinsed out by the time the procedure was completed.  I inspected all lower and upper pole calyces.  No further fragments were seen at this point.  The ureteroscope was removed.  The access catheter was removed as well.  Guidewire was backloaded through the cystoscope, and I passed a 26 cm x 6 French contour double-J stent into the upper pole calyceal system over top of the guidewire.  Once adequately positioned, the stent was deployed with excellent proximal distal curl seen using fluoroscopy and cystoscopy, respectively.  The bladder was drained.  The tether was left intact.  The scope was removed.  The tether was tied right outside the urethral meatus, trimmed, and then tucked in  her vagina.  At this point the procedure was terminated.  The patient was awakened, taken to the PACU in stable condition, having tolerated the procedure well. ? ?She was given instructions to remove the stent in 3 days, in the morning. ?

## 2021-08-26 NOTE — Transfer of Care (Signed)
Immediate Anesthesia Transfer of Care Note ? ?Patient: Misty [name] ? ?Procedure(s) Performed: CYSTOSCOPY, URETEROSCOPY AND STENT  EXCHANGE, EXTRACTION OF RIGHT URETERAL STONE, FLUOROSCOPIC INTERPRETATION (Right: Ureter) ?HOLMIUM LASER APPLICATION (Right: Ureter) ? ?Patient Location: PACU ? ?Anesthesia Type:General ? ?Level of Consciousness: drowsy and patient cooperative ? ?Airway & Oxygen Therapy: Patient Spontanous Breathing and Patient connected to face mask oxygen ? ?Post-op Assessment: Report given to RN and Post -op Vital signs reviewed and stable ? ?Post vital signs: Reviewed and stable ? ?Last Vitals:  ?Vitals Value Taken Time  ?BP 153/88 08/26/21 0834  ?Temp 36.2 ?C 08/26/21 0834  ?Pulse 79 08/26/21 0839  ?Resp 15 08/26/21 0839  ?SpO2 99 % 08/26/21 0839  ?Vitals shown include unvalidated device data. ? ?Last Pain:  ?Vitals:  ? 08/26/21 0600  ?TempSrc: Oral  ?PainSc: 7   ?   ? ?Patients Stated Pain Goal: 3 (08/26/21 0600) ? ?Complications: No notable events documented. ?

## 2021-08-29 ENCOUNTER — Other Ambulatory Visit (HOSPITAL_COMMUNITY): Payer: Self-pay

## 2021-08-29 ENCOUNTER — Encounter (HOSPITAL_BASED_OUTPATIENT_CLINIC_OR_DEPARTMENT_OTHER): Payer: Self-pay | Admitting: Urology

## 2021-08-29 DIAGNOSIS — R1084 Generalized abdominal pain: Secondary | ICD-10-CM | POA: Diagnosis not present

## 2021-08-29 DIAGNOSIS — N201 Calculus of ureter: Secondary | ICD-10-CM | POA: Diagnosis not present

## 2021-09-03 ENCOUNTER — Other Ambulatory Visit: Payer: Self-pay | Admitting: Gastroenterology

## 2021-09-03 ENCOUNTER — Other Ambulatory Visit (HOSPITAL_COMMUNITY): Payer: Self-pay

## 2021-09-05 ENCOUNTER — Other Ambulatory Visit (HOSPITAL_COMMUNITY): Payer: Self-pay

## 2021-09-05 ENCOUNTER — Other Ambulatory Visit: Payer: Self-pay | Admitting: *Deleted

## 2021-09-05 MED ORDER — TAMOXIFEN CITRATE 10 MG PO TABS
10.0000 mg | ORAL_TABLET | Freq: Every day | ORAL | 3 refills | Status: DC
  Filled 2021-09-05: qty 90, 90d supply, fill #0
  Filled 2021-12-03: qty 90, 90d supply, fill #1
  Filled 2022-03-02: qty 90, 90d supply, fill #2
  Filled 2022-06-13: qty 90, 90d supply, fill #3

## 2021-09-05 MED ORDER — OMEPRAZOLE 40 MG PO CPDR
DELAYED_RELEASE_CAPSULE | ORAL | 0 refills | Status: DC
  Filled 2021-09-05: qty 30, 30d supply, fill #0

## 2021-09-05 NOTE — Progress Notes (Signed)
Verbal orders received by MD to send in prescription for Tamoxifen 10 mg tablet p.o daily with expected start date 09/17/21.  Prescription sent to pharmacy on file.  Pt educated and verbalized understanding.  ?

## 2021-09-09 DIAGNOSIS — N201 Calculus of ureter: Secondary | ICD-10-CM | POA: Diagnosis not present

## 2021-09-19 ENCOUNTER — Ambulatory Visit
Admission: RE | Admit: 2021-09-19 | Discharge: 2021-09-19 | Disposition: A | Discharge status: Home / Self Care | Payer: 59 | Source: Ambulatory Visit | Attending: Genetic Counselor | Admitting: Genetic Counselor

## 2021-09-19 ENCOUNTER — Telehealth: Payer: Self-pay | Admitting: *Deleted

## 2021-09-19 DIAGNOSIS — D0511 Intraductal carcinoma in situ of right breast: Secondary | ICD-10-CM | POA: Insufficient documentation

## 2021-09-19 NOTE — Telephone Encounter (Signed)
Received call from pt with complaint of right sided breast pain and nodule x several days.  Pt requesting office visit for breast exam and evaluation.  Appt scheduled for [facility].  Pt verbalized understanding of appt date and time.  ? ?

## 2021-09-20 ENCOUNTER — Encounter: Payer: 59 | Admitting: Adult Health

## 2021-09-20 ENCOUNTER — Telehealth: Payer: Self-pay | Admitting: *Deleted

## 2021-09-20 NOTE — Telephone Encounter (Signed)
Received call from pt requesting to cancel appt for today.  Pt states her daughter is in labor and is not able to come in.  Pt also states when she woke up this morning, the right sided breast pain and nodule have disappeared.  Pt states she will call the office if symptoms return. Pt verbalized understanding.  ?

## 2021-09-26 NOTE — Progress Notes (Signed)
?  Radiation Oncology         [phone] ?________________________________ ? ?Name: Misty [name] MRN: [id number]  ?Date of Service: 09/19/2021  DOB: 28-Dec-1975 ? ?Post Treatment Telephone Note ? ?Diagnosis:    Intermediate grade, ER/PR positive DCIS of the right breast. ? ?Intent: Curative ? ?Radiation Treatment Dates: 07/05/2021 through 08/22/2021 ?Site Technique Total Dose (Gy) Dose per Fx (Gy) Completed Fx Beam Energies  ?Breast, Right: Breast_R 3D 50.4/50.4 1.8 28/28 6XFFF  ?Breast, Right: Breast_R_Bst 3D 10/10 2 5/5 6X, 10X  ? ?Narrative: The patient tolerated radiation therapy relatively well. She developed fatigue and skin changes in the treatment field. At the end of therapy, she did have dry desquamation but no moist changes. ? ? ?Impression/Plan: ?1.  Intermediate grade, ER/PR positive DCIS of the right breast.  I was unable to reach the patient but left a voicemail and on the message, I discussed that we would be happy to continue to follow her as needed, but she will also continue to follow up with Dr. Lindi Adie in medical oncology. She was counseled to call if she had questions about skin care or measures to avoid sun exposure to this area.   ? ? ? ? ? ?Carola Rhine, PAC  ? ? ? ? ?

## 2021-09-28 ENCOUNTER — Other Ambulatory Visit (HOSPITAL_COMMUNITY): Payer: Self-pay

## 2021-09-28 ENCOUNTER — Telehealth: Payer: Self-pay | Admitting: *Deleted

## 2021-09-28 MED ORDER — FLUCONAZOLE 150 MG PO TABS
ORAL_TABLET | ORAL | 0 refills | Status: DC
  Filled 2021-09-28: qty 2, 3d supply, fill #0

## 2021-09-28 NOTE — Telephone Encounter (Signed)
Received call from pt with complaint of insomnia and severe fatigue since starting Tamoxifen.  Per MD pt needing to stop Tamoxifen x2 weeks and follow up in office to review symptoms.  MD states if pt continues to experience fatigue, iron studies and B12 lab work will need to be ordered prior to f/u visit.  Pt educated and verbalized understanding.  ?

## 2021-09-30 NOTE — Progress Notes (Signed)
? ?Patient Care Team: ?[name], MD as PCP - General (Family Medicine) ?Mauro Kaufmann, RN as Oncology Nurse Navigator ?Rockwell Germany, RN as Oncology Nurse Navigator ? ?DIAGNOSIS:  ?Encounter Diagnosis  ?Name Primary?  ? Ductal carcinoma in situ (DCIS) of right breast Yes  ? ? ?SUMMARY OF ONCOLOGIC HISTORY: ?Oncology History  ?Ductal carcinoma in situ (DCIS) of right breast  ?04/28/2021 Initial Diagnosis  ? Screening mammogram detected right breast mass at 4 o'clock position 10 cm from the nipple measuring 2.8 x 1.5 x 3.3 cm, axilla negative, biopsy revealed low to intermediate grade DCIS involving papillary lesion, ER/PR positive ?  ?05/26/2021 Surgery  ? (Dr. Brantley Stage): Right lumpectomy: Intermediate grade DCIS involving a papillary lesion 5.2 cm, no evidence of invasive cancer, resection margins are negative, ER 100%, PR 100% ?  ? ? ?CHIEF COMPLIANT: Follow-up Tamoxifen ? ?INTERVAL HISTORY: Misty [name] is a  46 y.o. with above-mentioned history of right breast DCIS having undergone right lumpectomy. She presents to the clinic today for follow-up. She complains of fatigue. Complains of mood swings. States that she has hot flashes but she was expercing that before tamoxifen. Denies of breast pain. ? ? ?ALLERGIES:  is allergic to dilaudid [hydromorphone hcl], ceftriaxone sodium, and latex. ? ?MEDICATIONS:  ?Current Outpatient Medications  ?Medication Sig Dispense Refill  ? ALPRAZolam (XANAX) 1 MG tablet Take 1 tablet by mouth 3 times daily. 270 tablet 0  ? amphetamine-dextroamphetamine (ADDERALL) 20 MG tablet Take 1 tablet by mouth 3 times daily 270 tablet 0  ? montelukast (SINGULAIR) 10 MG tablet Take 1 tablet by mouth every evening (Patient taking differently: Take 10 mg by mouth as needed.) 90 tablet 0  ? omeprazole (PRILOSEC) 40 MG capsule TAKE 1 CAPSULE BY MOUTH ONCE DAILY BEFORE BREAKFAST 15 capsule 0  ? SUMAtriptan (IMITREX) 50 MG tablet Take 1 tablet by mouth at onset of headache followed by 1  tablet 2 hours later if symptoms persist 9 tablet 0  ? tamoxifen (NOLVADEX) 10 MG tablet Take 1 tablet (10 mg total) by mouth daily. 90 tablet 3  ? ?No current facility-administered medications for this visit.  ? ? ?PHYSICAL EXAMINATION: ?ECOG PERFORMANCE STATUS: 1 - Symptomatic but completely ambulatory ? ?Vitals:  ? 10/14/21 1218  ?BP: (!) 141/66  ?Pulse: 87  ?Resp: 16  ?Temp: 97.9 ?F (36.6 ?C)  ?SpO2: 97%  ? ?Filed Weights  ? 10/14/21 1218  ?Weight: 210 lb 14.4 oz (95.7 kg)  ? ? ?BREAST:  No palpable masses or nodules in either right or left breasts. No palpable axillary supraclavicular or infraclavicular adenopathy no breast tenderness or nipple discharge. (exam performed in the presence of a chaperone) ? ?LABORATORY DATA:  ?I have reviewed the data as listed ? ?  Latest Ref Rng & Units 07/28/2021  ?  7:26 AM 07/27/2021  ?  4:01 AM 07/26/2021  ?  6:54 PM  ?CMP  ?Glucose 70 - 99 mg/dL 135   159   160    ?BUN 6 - 20 mg/dL '22   20   17    '$ ?Creatinine 0.44 - 1.00 mg/dL 0.79   1.25   1.56    ?Sodium 135 - 145 mmol/L 136   135   135    ?Potassium 3.5 - 5.1 mmol/L 3.2   3.6   3.1    ?Chloride 98 - 111 mmol/L 107   107   108    ?CO2 22 - 32 mmol/L 23   21  19    ?Calcium 8.9 - 10.3 mg/dL 10.0   9.7   9.4    ?Total Protein 6.5 - 8.1 g/dL  5.9     ?Total Bilirubin 0.3 - 1.2 mg/dL  1.4     ?Alkaline Phos 38 - 126 U/L  49     ?AST 15 - 41 U/L  25     ?ALT 0 - 44 U/L  24     ? ? ?Lab Results  ?Component Value Date  ? WBC 26.3 (H) 07/28/2021  ? HGB 11.6 (L) 07/28/2021  ? HCT 35.7 (L) 07/28/2021  ? MCV 79.5 (L) 07/28/2021  ? PLT 117 (L) 07/28/2021  ? NEUTROABS 5.0 07/25/2021  ? ? ?ASSESSMENT & PLAN:  ?Ductal carcinoma in situ (DCIS) of right breast ?05/26/2021: (Dr. Brantley Stage): Right lumpectomy: Intermediate grade DCIS involving a papillary lesion 5.2 cm, no evidence of invasive cancer, resection margins are negative, ER 100%, PR 100% ?  ?Treatment plan: ?1. adjuvant radiation therapy 07/27/2021-08/22/2021 ?2. Followed by antiestrogen  therapy with tamoxifen ?5 years.  I encouraged her to start the tamoxifen at 10 mg daily 09/17/2021 ?  ?Sepsis with pyelonephritis and hydronephrosis: Stent was placed in the hospital.  ?  ?Tamoxifen toxicities: ?1.  Fatigue issues: I instructed her to take tamoxifen at bedtime. ?Denies any worsening of hot flashes or arthralgias or myalgias. ?She has pre-existing mood swings but have not gotten any worse. ? ?Breast cancer surveillance: Mammograms to be done October 2023 ?Breast exam 10/06/2021: Benign ?Return to clinic in 1 year for follow-up ? ? ? ? ?Orders Placed This Encounter  ?Procedures  ? MM DIAG BREAST TOMO BILATERAL  ?  Standing Status:   Future  ?  Standing Expiration Date:   10/15/2022  ?  Order Specific Question:   Reason for Exam (SYMPTOM  OR DIAGNOSIS REQUIRED)  ?  Answer:   annual mammamogram  ?  Order Specific Question:   Is the patient pregnant?  ?  Answer:   No  ?  Order Specific Question:   Preferred imaging location?  ?  Answer:   [facility]  ? ?The patient has a good understanding of the overall plan. she agrees with it. she will call with any problems that may develop before the next visit here. ?Total time spent: 30 mins including face to face time and time spent for planning, charting and co-ordination of care ? ? Harriette Ohara, MD ?10/14/21 ? ? ? I Gardiner Coins am scribing for Dr. Lindi Misty ? ?I have reviewed the above documentation for accuracy and completeness, and I agree with the above. ?  ?

## 2021-10-12 ENCOUNTER — Other Ambulatory Visit: Payer: Self-pay | Admitting: Gastroenterology

## 2021-10-12 ENCOUNTER — Other Ambulatory Visit (HOSPITAL_COMMUNITY): Payer: Self-pay

## 2021-10-12 MED ORDER — AMPHETAMINE-DEXTROAMPHETAMINE 20 MG PO TABS
20.0000 mg | ORAL_TABLET | Freq: Three times a day (TID) | ORAL | 0 refills | Status: DC
  Filled 2021-10-12: qty 270, 90d supply, fill #0

## 2021-10-12 MED ORDER — ALPRAZOLAM 1 MG PO TABS
ORAL_TABLET | ORAL | 0 refills | Status: DC
  Filled 2021-10-12: qty 270, 90d supply, fill #0

## 2021-10-12 MED ORDER — OMEPRAZOLE 40 MG PO CPDR
DELAYED_RELEASE_CAPSULE | ORAL | 0 refills | Status: DC
  Filled 2021-10-12: qty 15, 15d supply, fill #0

## 2021-10-14 ENCOUNTER — Inpatient Hospital Stay: Payer: 59 | Attending: Hematology and Oncology | Admitting: Hematology and Oncology

## 2021-10-14 ENCOUNTER — Other Ambulatory Visit: Payer: Self-pay

## 2021-10-14 VITALS — BP 141/66 | HR 87 | Temp 97.9°F | Resp 16 | Ht 69.0 in | Wt 210.9 lb

## 2021-10-14 DIAGNOSIS — D0511 Intraductal carcinoma in situ of right breast: Secondary | ICD-10-CM | POA: Insufficient documentation

## 2021-10-14 DIAGNOSIS — Z79810 Long term (current) use of selective estrogen receptor modulators (SERMs): Secondary | ICD-10-CM | POA: Insufficient documentation

## 2021-10-14 DIAGNOSIS — Z170 Estrogen receptor positive status [ER+]: Secondary | ICD-10-CM | POA: Diagnosis not present

## 2021-10-14 NOTE — Assessment & Plan Note (Addendum)
05/26/2021: (Dr. Brantley Stage): Right lumpectomy: Intermediate grade DCIS involving a papillary lesion 5.2 cm, no evidence of invasive cancer, resection margins are negative, ER 100%, PR 100% ?? ?Treatment plan: ?1.?adjuvant radiation therapy 07/27/2021-08/22/2021 ?2. Followed by antiestrogen therapy with tamoxifen ?5 years.  I encouraged her to start the tamoxifen at 10 mg daily 09/17/2021 ?? ?Sepsis with pyelonephritis and hydronephrosis: Stent was placed in the hospital.  ?? ?Tamoxifen toxicities: ?1.  Fatigue issues: I instructed her to take tamoxifen at bedtime. ?Denies any worsening of hot flashes or arthralgias or myalgias. ?She has pre-existing mood swings but have not gotten any worse. ? ?Breast cancer surveillance: Mammograms to be done October 2023 ?Breast exam 10/06/2021: Benign ?Return to clinic in 1 year for follow-up ? ? ?

## 2021-11-02 ENCOUNTER — Other Ambulatory Visit (HOSPITAL_COMMUNITY): Payer: Self-pay

## 2021-11-02 DIAGNOSIS — K219 Gastro-esophageal reflux disease without esophagitis: Secondary | ICD-10-CM | POA: Diagnosis not present

## 2021-11-02 DIAGNOSIS — Z1211 Encounter for screening for malignant neoplasm of colon: Secondary | ICD-10-CM | POA: Diagnosis not present

## 2021-11-02 DIAGNOSIS — R1013 Epigastric pain: Secondary | ICD-10-CM | POA: Diagnosis not present

## 2021-11-02 MED ORDER — OMEPRAZOLE 40 MG PO CPDR
DELAYED_RELEASE_CAPSULE | ORAL | 3 refills | Status: DC
  Filled 2021-11-02: qty 90, 90d supply, fill #0
  Filled 2022-01-31: qty 90, 90d supply, fill #1
  Filled 2022-05-17: qty 90, 90d supply, fill #2
  Filled 2022-08-18: qty 90, 90d supply, fill #3

## 2021-11-16 ENCOUNTER — Telehealth: Payer: Self-pay

## 2021-11-16 ENCOUNTER — Inpatient Hospital Stay: Payer: 59 | Admitting: Adult Health

## 2021-11-16 NOTE — Telephone Encounter (Signed)
Called pt regarding appt for Survivorship with Mendel Ryder, NP. Pt states she forgot about her appt and would like to r/s. Message sent to schedulers to get in touch with pt to r/s. She is aware and verbalized thanks.

## 2021-11-17 ENCOUNTER — Telehealth: Payer: Self-pay | Admitting: Physician Assistant

## 2021-11-17 NOTE — Telephone Encounter (Signed)
.  Called patient to schedule appointment per 5/31 inbasket, patient is aware of date and time.   

## 2021-11-29 ENCOUNTER — Other Ambulatory Visit: Payer: Self-pay

## 2021-11-29 ENCOUNTER — Inpatient Hospital Stay: Payer: 59 | Attending: Hematology and Oncology | Admitting: Adult Health

## 2021-11-29 VITALS — BP 145/104 | HR 77 | Temp 97.3°F | Resp 18 | Ht 69.0 in | Wt 214.0 lb

## 2021-11-29 DIAGNOSIS — Z170 Estrogen receptor positive status [ER+]: Secondary | ICD-10-CM | POA: Diagnosis not present

## 2021-11-29 DIAGNOSIS — D0511 Intraductal carcinoma in situ of right breast: Secondary | ICD-10-CM | POA: Insufficient documentation

## 2021-11-29 DIAGNOSIS — Z79899 Other long term (current) drug therapy: Secondary | ICD-10-CM | POA: Diagnosis not present

## 2021-11-29 DIAGNOSIS — Z923 Personal history of irradiation: Secondary | ICD-10-CM | POA: Diagnosis not present

## 2021-11-29 DIAGNOSIS — Z79810 Long term (current) use of selective estrogen receptor modulators (SERMs): Secondary | ICD-10-CM | POA: Insufficient documentation

## 2021-11-30 ENCOUNTER — Telehealth: Payer: Self-pay | Admitting: Adult Health

## 2021-11-30 ENCOUNTER — Ambulatory Visit (INDEPENDENT_AMBULATORY_CARE_PROVIDER_SITE_OTHER): Payer: 59 | Admitting: Family Medicine

## 2021-11-30 NOTE — Telephone Encounter (Signed)
Scheduled appointment per 6/13 los. Left message.

## 2021-11-30 NOTE — Progress Notes (Signed)
SURVIVORSHIP VISIT:   BRIEF ONCOLOGIC HISTORY:  Oncology History  Ductal carcinoma in situ (DCIS) of right breast  04/28/2021 Initial Diagnosis   Screening mammogram detected right breast mass at 4 o'clock position 10 cm from the nipple measuring 2.8 x 1.5 x 3.3 cm, axilla negative, biopsy revealed low to intermediate grade DCIS involving papillary lesion, ER/PR positive   05/26/2021 Surgery   (Dr. Brantley Stage): Right lumpectomy: Intermediate grade DCIS involving a papillary lesion 5.2 cm, no evidence of invasive cancer, resection margins are negative, ER 100%, PR 100%   06/17/2021 Genetic Testing   The [facility] CustomNext Panel found no pathogenic mutations.    The CustomNext-Cancer+RNAinsight panel offered by [facility] includes sequencing and rearrangement analysis for the following 47 genes:  APC, ATM, AXIN2, BARD1, BMPR1A, BRCA1, BRCA2, BRIP1, CDH1, CDK4, CDKN2A, CHEK2, CTNNA1, DICER1, EPCAM, GREM1, HOXB13, KIT, MEN1, MLH1, MSH2, MSH3, MSH6, MUTYH, NBN, NF1, NTHL1, PALB2, PDGFRA, PMS2, POLD1, POLE, PTEN, RAD50, RAD51C, RAD51D, SDHA, SDHB, SDHC, SDHD, SMAD4, SMARCA4, STK11, TP53, TSC1, TSC2, and VHL.  RNA data is routinely analyzed for use in variant interpretation for all genes.   07/05/2021 - 08/22/2021 Radiation Therapy   Site Technique Total Dose (Gy) Dose per Fx (Gy) Completed Fx Beam Energies  Breast, Right: Breast_R 3D 50.4/50.4 1.8 28/28 6XFFF  Breast, Right: Breast_R_Bst 3D 10/10 2 5/5 6X, 10X     09/17/2021 -  Anti-estrogen oral therapy   10 mg Tamoxifen daily   11/29/2021 Cancer Staging   Staging form: Breast, AJCC 8th Edition - Clinical: Stage 0 (cTis (DCIS), cN0, cM0) - Signed by Gardenia Phlegm, NP on 11/29/2021 Stage prefix: Initial diagnosis     INTERVAL HISTORY:  Ms. Rigdon to review her survivorship care plan detailing her treatment course for breast cancer, as well as monitoring long-term side effects of that treatment, education regarding health maintenance,  screening, and overall wellness and health promotion.     Overall, Ms. Cogliano reports feeling quite well.  She is taking Tamoxifen dailya nd tolerates it well other than some mild hot flashes.    REVIEW OF SYSTEMS:  Review of Systems  Constitutional:  Negative for appetite change, chills, fatigue, fever and unexpected weight change.  HENT:   Negative for hearing loss, lump/mass and trouble swallowing.   Eyes:  Negative for eye problems and icterus.  Respiratory:  Negative for chest tightness, cough and shortness of breath.   Cardiovascular:  Negative for chest pain, leg swelling and palpitations.  Gastrointestinal:  Negative for abdominal distention, abdominal pain, constipation, diarrhea, nausea and vomiting.  Endocrine: Positive for hot flashes.  Genitourinary:  Negative for difficulty urinating.   Musculoskeletal:  Negative for arthralgias.  Skin:  Negative for itching and rash.  Neurological:  Negative for dizziness, extremity weakness, headaches and numbness.  Hematological:  Negative for adenopathy. Does not bruise/bleed easily.  Psychiatric/Behavioral:  Negative for depression. The patient is not nervous/anxious.    Breast: Denies any new nodularity, masses, tenderness, nipple changes, or nipple discharge.      ONCOLOGY TREATMENT TEAM:  1. Surgeon:  Dr. Brantley Stage at [facility] 2. Medical Oncologist: Dr. Lindi Adie  3. Radiation Oncologist: Dr. Lisbeth Renshaw    PAST MEDICAL/SURGICAL HISTORY:  Past Medical History:  Diagnosis Date   ADHD (attention deficit hyperactivity disorder)    Anemia    Environmental and seasonal allergies    Frequency of urination    GAD (generalized anxiety disorder)    GERD (gastroesophageal reflux disease)    History of COVID-19 06/12/2020  result in epic;  per pt mild symptoms that resolved   History of hiatal hernia    History of kidney stones    History of radiation therapy    07-27-2021 to 08-22-2021   Hyperparathyroidism, primary [facility])     currently followed by pcp  (previously seen endocrinologist-- dr Louie Boston note in epic 08-29-2017, doctor moved away)   Right Breast cancer ([place]) 04/28/2021   Sepsis ([place])    admit 07-26-2021   Skin abnormalities 08/23/2021   right breast areas from radiation top of breast, around nipple and under right breast healing well   Tenosynovitis, de Quervain    left wrist   Wears glasses    Wears partial dentures upper flipper    Past Surgical History:  Procedure Laterality Date   BREAST LUMPECTOMY WITH RADIOACTIVE SEED LOCALIZATION Right 05/26/2021   Procedure: RIGHT BREAST LUMPECTOMY WITH RADIOACTIVE SEED LOCALIZATION;  Surgeon: Erroll Luna, MD;  Location: [place];  Service: General;  Laterality: Right;   CYSTO W/ RIGHT URETEROSCOPY  2006   CYSTO/ RIGHT URETERAL STENT PLACEMENT  07-29-2009   LARGE RIGHT RENAL CALCULUS   CYSTOLITHOLAPAXY OF LARGE BLADDER CALCULI/   RIGHT URETERAL STENT EXCHANGE  04-15-2010   CYSTOSCOPY WITH RETROGRADE PYELOGRAM, URETEROSCOPY AND STENT PLACEMENT Right 08/26/2021   Procedure: CYSTOSCOPY, URETEROSCOPY AND STENT  EXCHANGE, EXTRACTION OF RIGHT URETERAL STONE, FLUOROSCOPIC INTERPRETATION;  Surgeon: Franchot Gallo, MD;  Location: [place];  Service: Urology;  Laterality: Right;   CYSTOSCOPY WITH STENT PLACEMENT  07/04/2012   Procedure: CYSTOSCOPY WITH STENT PLACEMENT;  Surgeon: Franchot Gallo, MD;  Location: [facility];  Service: Urology;  Laterality: Right;  rt dbl j stent placement    CYSTOSCOPY WITH STENT PLACEMENT Right 07/26/2021   Procedure: CYSTOSCOPY WITH STENT PLACEMENT;  Surgeon: Ardis Hughs, MD;  Location: [facility];  Service: Urology;  Laterality: Right;   CYSTOSCOPY/RETROGRADE/URETEROSCOPY/STONE EXTRACTION WITH BASKET  07/25/2012   Procedure: CYSTOSCOPY/RETROGRADE/URETEROSCOPY/STONE EXTRACTION WITH BASKET;  Surgeon: Franchot Gallo, MD;  Location: [facility];  Service: Urology;   Laterality: Right;   EXTRACORPOREAL SHOCK WAVE LITHOTRIPSY  08-02-2009   RIGHT   HOLMIUM LASER APPLICATION  4/0/1027   Procedure: HOLMIUM LASER APPLICATION;  Surgeon: Franchot Gallo, MD;  Location: [facility];  Service: Urology;  Laterality: Right;   HOLMIUM LASER APPLICATION Right 2/53/6644   Procedure: HOLMIUM LASER APPLICATION;  Surgeon: Franchot Gallo, MD;  Location: [facility];  Service: Urology;  Laterality: Right;   LAPAROSCOPIC TUBAL LIGATION Bilateral 01/17/2013   Procedure: LAPAROSCOPIC TUBAL LIGATION;  Surgeon: Melina Schools, MD;  Location: [facility];  Service: Gynecology;  Laterality: Bilateral;  1hr OR time    MINOR RELEASE DORSAL COMPARTMENT (DEQUERVAINS) Left 04/26/2021   Procedure: MINOR RELEASE DORSAL COMPARTMENT (DEQUERVAINS) WITH GANGLION EXCISION;  Surgeon: Renette Butters, MD;  Location: [place];  Service: Orthopedics;  Laterality: Left;     ALLERGIES:  Allergies  Allergen Reactions   Dilaudid [Hydromorphone Hcl] Shortness Of Breath and Nausea And Vomiting    Pt has takes percocet at home (see med rec).   Ceftriaxone Sodium Other (See Comments)    Hair loss with rocephrin   Latex Rash     CURRENT MEDICATIONS:  Outpatient Encounter Medications as of 11/29/2021  Medication Sig   ALPRAZolam (XANAX) 1 MG tablet Take 1 tablet by mouth 3 times daily.   amphetamine-dextroamphetamine (ADDERALL) 20 MG tablet Take 1 tablet by mouth 3 times daily  montelukast (SINGULAIR) 10 MG tablet Take 1 tablet by mouth every evening (Patient taking differently: Take 10 mg by mouth as needed.)   omeprazole (PRILOSEC) 40 MG capsule Take 1 capsule by mouth 30 minutes before morning meal   SUMAtriptan (IMITREX) 50 MG tablet Take 1 tablet by mouth at onset of headache followed by 1 tablet 2 hours later if symptoms persist   tamoxifen (NOLVADEX) 10 MG tablet Take 1 tablet (10 mg total) by mouth daily.   [DISCONTINUED] omeprazole  (PRILOSEC) 40 MG capsule TAKE 1 CAPSULE BY MOUTH ONCE DAILY BEFORE BREAKFAST   No facility-administered encounter medications on file as of 11/29/2021.     ONCOLOGIC FAMILY HISTORY:  Family History  Problem Relation Age of Onset   Breast cancer Maternal Aunt 68       DCIS   Esophageal cancer Maternal Uncle    Stomach cancer Neg Hx    Colon cancer Neg Hx    Pancreatic cancer Neg Hx        SOCIAL HISTORY:  Social History   Socioeconomic History   Marital status: Married    Spouse name: Not on file   Number of children: Not on file   Years of education: Not on file   Highest education level: Not on file  Occupational History   Not on file  Tobacco Use   Smoking status: Never   Smokeless tobacco: Never  Vaping Use   Vaping Use: Never used  Substance and Sexual Activity   Alcohol use: Not Currently    Comment: occasional   Drug use: Never   Sexual activity: Not on file  Other Topics Concern   Not on file  Social History Narrative   Not on file   Social Determinants of Health   Financial Resource Strain: Not on file  Food Insecurity: Not on file  Transportation Needs: Not on file  Physical Activity: Not on file  Stress: Not on file  Social Connections: Not on file  Intimate Partner Violence: Not At Risk (05/18/2021)   Humiliation, Afraid, Rape, and Kick questionnaire    Fear of Current or Ex-Partner: No    Emotionally Abused: No    Physically Abused: No    Sexually Abused: No     OBSERVATIONS/OBJECTIVE:  BP (!) 145/104 (BP Location: Left Arm, Patient Position: Sitting)   Pulse 77   Temp (!) 97.3 F (36.3 C) (Temporal)   Resp 18   Ht 5' 9"  (1.753 m)   Wt 214 lb (97.1 kg)   SpO2 100%   BMI 31.60 kg/m  GENERAL: Patient is a well appearing female in no acute distress HEENT:  Sclerae anicteric.  Oropharynx clear and moist. No ulcerations or evidence of oropharyngeal candidiasis. Neck is supple.  NODES:  No cervical, supraclavicular, or axillary  lymphadenopathy palpated.  BREAST EXAM:  right breast s/p lumpectomy and radiation, no sign of local recurrence, left breast benign LUNGS:  Clear to auscultation bilaterally.  No wheezes or rhonchi. HEART:  Regular rate and rhythm. No murmur appreciated. ABDOMEN:  Soft, nontender.  Positive, normoactive bowel sounds. No organomegaly palpated. MSK:  No focal spinal tenderness to palpation. Full range of motion bilaterally in the upper extremities. EXTREMITIES:  No peripheral edema.   SKIN:  Clear with no obvious rashes or skin changes. No nail dyscrasia. NEURO:  Nonfocal. Well oriented.  Appropriate affect.   LABORATORY DATA:  None for this visit.  DIAGNOSTIC IMAGING:  None for this visit.      ASSESSMENT AND PLAN:  Ms.. Cando is a pleasant 46 y.o. 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  She presents to the Survivorship Clinic for our initial meeting and routine follow-up post-completion of treatment for breast cancer.    1. Stage 0 right breast cancer:  Ms. Geeslin is continuing to recover from definitive treatment for breast cancer. She will follow-up with her medical oncologist, Dr. Lindi Adie in 6 months with history and physical exam per surveillance protocol.  She will continue her anti-estrogen therapy with Tamoxifen. Thus far, she is tolerating the Tamoxifen well, with minimal side effects. She was instructed to make Dr. Lindi Adie or myself aware if she begins to experience any worsening side effects of the medication and I could see her back in clinic to help manage those side effects, as needed. Her mammogram is due 03/2022; orders placed today. Today, a comprehensive survivorship care plan and treatment summary was reviewed with the patient today detailing her breast cancer diagnosis, treatment course, potential late/long-term  effects of treatment, appropriate follow-up care with recommendations for the future, and patient education resources.  A copy of this summary, along with a letter will be sent to the patient's primary care provider via mail/fax/In Basket message after today's visit.    2. Bone health:    She was given education on specific activities to promote bone health.  3. Cancer screening:  Due to Ms. Beadnell's history and her age, she should receive screening for skin cancers, colon cancer, and gynecologic cancers.  The information and recommendations are listed on the patient's comprehensive care plan/treatment summary and were reviewed in detail with the patient.    4. Health maintenance and wellness promotion: Ms. Heyward was encouraged to consume 5-7 servings of fruits and vegetables per day. We reviewed the "Nutrition Rainbow" handout.  She was also encouraged to engage in moderate to vigorous exercise for 30 minutes per day most days of the week. We discussed the [employer] fitness program, which is designed for cancer survivors to help them become more physically fit after cancer treatments.  She was instructed to limit her alcohol consumption and continue to abstain from tobacco use.     5. Support services/counseling: It is not uncommon for this period of the patient's cancer care trajectory to be one of many emotions and stressors.    She was given information regarding our available services and encouraged to contact me with any questions or for help enrolling in any of our support group/programs.    Follow up instructions:    -Return to cancer center in 6 months for f/u  -Mammogram due in 03/2022 -Follow up with surgery 1 year -She is welcome to return back to the Survivorship Clinic at any time; no additional follow-up needed at this time.  -Consider referral back to survivorship as a long-term survivor for continued surveillance  The patient was provided an opportunity to ask questions and  all were answered. The patient agreed with the plan and demonstrated an understanding of the instructions.   Total encounter time:40 minutes*in face-to-face visit time, chart review, lab review, care coordination, order entry, and documentation of the encounter time.    Wilber Bihari, NP 11/30/21 9:28 PM Medical Oncology and Hematology [facility] [place], [place] [address] Tel. [phone]    Fax. [phone]  *Total Encounter Time as defined by the Centers for Medicare and  Medicaid Services includes, in addition to the face-to-face time of a patient visit (documented in the note above) non-face-to-face time: obtaining and reviewing outside history, ordering and reviewing medications, tests or procedures, care coordination (communications with other health care professionals or caregivers) and documentation in the medical record.

## 2021-12-03 ENCOUNTER — Other Ambulatory Visit (HOSPITAL_COMMUNITY): Payer: Self-pay

## 2021-12-07 ENCOUNTER — Other Ambulatory Visit (HOSPITAL_COMMUNITY): Payer: Self-pay

## 2021-12-07 MED ORDER — METRONIDAZOLE 500 MG PO TABS
ORAL_TABLET | ORAL | 0 refills | Status: DC
  Filled 2021-12-07: qty 14, 7d supply, fill #0

## 2021-12-08 ENCOUNTER — Other Ambulatory Visit (HOSPITAL_COMMUNITY): Payer: Self-pay

## 2021-12-08 MED ORDER — METRONIDAZOLE 500 MG PO TABS
ORAL_TABLET | ORAL | 0 refills | Status: DC
  Filled 2021-12-08: qty 14, 7d supply, fill #0

## 2021-12-09 ENCOUNTER — Other Ambulatory Visit (HOSPITAL_COMMUNITY): Payer: Self-pay

## 2021-12-09 DIAGNOSIS — N201 Calculus of ureter: Secondary | ICD-10-CM | POA: Diagnosis not present

## 2021-12-09 MED ORDER — FLUCONAZOLE 150 MG PO TABS
ORAL_TABLET | ORAL | 0 refills | Status: DC
  Filled 2021-12-09: qty 2, 7d supply, fill #0

## 2021-12-09 MED ORDER — CEPHALEXIN 500 MG PO CAPS
ORAL_CAPSULE | ORAL | 0 refills | Status: DC
  Filled 2021-12-09: qty 14, 7d supply, fill #0

## 2021-12-12 ENCOUNTER — Other Ambulatory Visit (HOSPITAL_COMMUNITY): Payer: Self-pay

## 2021-12-12 DIAGNOSIS — F4310 Post-traumatic stress disorder, unspecified: Secondary | ICD-10-CM | POA: Diagnosis not present

## 2021-12-14 ENCOUNTER — Ambulatory Visit (INDEPENDENT_AMBULATORY_CARE_PROVIDER_SITE_OTHER): Payer: 59 | Admitting: Family Medicine

## 2021-12-24 ENCOUNTER — Other Ambulatory Visit (HOSPITAL_COMMUNITY): Payer: Self-pay

## 2022-01-10 ENCOUNTER — Encounter: Payer: Self-pay | Admitting: Gastroenterology

## 2022-01-10 ENCOUNTER — Ambulatory Visit: Payer: 59 | Admitting: Gastroenterology

## 2022-01-10 ENCOUNTER — Other Ambulatory Visit (HOSPITAL_COMMUNITY): Payer: Self-pay

## 2022-01-10 DIAGNOSIS — Z1211 Encounter for screening for malignant neoplasm of colon: Secondary | ICD-10-CM | POA: Diagnosis not present

## 2022-01-10 DIAGNOSIS — R198 Other specified symptoms and signs involving the digestive system and abdomen: Secondary | ICD-10-CM

## 2022-01-10 MED ORDER — CITRUCEL PO POWD: 1.0000 | Freq: Every day | ORAL | Status: DC

## 2022-01-10 MED ORDER — NA SULFATE-K SULFATE-MG SULF 17.5-3.13-1.6 GM/177ML PO SOLN
1.0000 | ORAL | 0 refills | Status: DC
  Filled 2022-01-10: qty 354, 1d supply, fill #0

## 2022-01-10 NOTE — Patient Instructions (Signed)
If you are age 48 or younger, your body mass index should be between 19-25. Your Body mass index is 31.16 kg/m. If this is out of the aformentioned range listed, please consider follow up with your Primary Care Provider.  ________________________________________________________  The [facility] providers would like to encourage you to use [facility] to communicate with providers for non-urgent requests or questions.  Due to long hold times on the telephone, sending your provider a message by [facility] may be a faster and more efficient way to get a response.  Please allow 48 business hours for a response.  Please remember that this is for non-urgent requests.  _______________________________________________________  Misty [name] have been scheduled for an endoscopy and colonoscopy. Please follow the written instructions given to you at your visit today. Please pick up your prep supplies at the pharmacy within the next 1-3 days. If you use inhalers (even only as needed), please bring them with you on the day of your procedure.  Due to recent changes in healthcare laws, you may see the results of your imaging and laboratory studies on MyChart before your provider has had a chance to review them.  We understand that in some cases there may be results that are confusing or concerning to you. Not all laboratory results come back in the same time frame and the provider may be waiting for multiple results in order to interpret others.  Please give [place] 48 hours in order for your provider to thoroughly review all the results before contacting the office for clarification of your results.   Please start taking citrucel (orange flavored) powder fiber supplement.  This may cause some bloating at first but that usually goes away. Begin with a small spoonful and work your way up to a large, heaping spoonful daily over a week.  Thank you for entrusting me with your care and choosing [facility].  Dr Ardis Hughs

## 2022-01-10 NOTE — Progress Notes (Signed)
Review of pertinent gastrointestinal problems: 1.  NSAID related GI discomforts.  EGD 05/2019 Dr. Ardis Hughs was completely normal.  She has been taking a variety of NSAIDs on a daily basis.  Omeprazole once daily seem to help her overall symptoms.  HPI: This is a very pleasant 46 year old woman whom I last saw about 3 years ago.    Through epic I can see that she established care with Dr. Arta Silence at [facility] gastroenterology 2 months ago.  She had quite a lot to say about her visit at [facility] gastroenterology.  She was very unhappy with her visit there, the advice they gave her.  After her visit they told her that she had H. pylori based on blood testing.  They also told her that I had retired.  She inquired about further and set herself up to see me again here in the office.  She is concerned because she was told that she has H. pylori infection based on serum antibody testing.  She has irregular bowel habits with a lot of gas and bloating, difficult to move stools at times.  She has not seen any blood in her stool.  Her weight has been overall stable.  She takes a lot of Gas-X and it does seem to help but not always.      Review of systems: Pertinent positive and negative review of systems were noted in the above HPI section. All other review negative.   Past Medical History:  Diagnosis Date   ADHD (attention deficit hyperactivity disorder)    Anemia    Environmental and seasonal allergies    Frequency of urination    GAD (generalized anxiety disorder)    GERD (gastroesophageal reflux disease)    History of COVID-19 06/12/2020   result in epic;  per pt mild symptoms that resolved   History of hiatal hernia    History of kidney stones    History of radiation therapy    07-27-2021 to 08-22-2021   Hyperparathyroidism, primary ([place])    currently followed by pcp  (previously seen endocrinologist-- dr Louie Boston note in epic 08-29-2017, doctor moved away)   Right Breast cancer ([place])  04/28/2021   Sepsis ([place])    admit 07-26-2021   Skin abnormalities 08/23/2021   right breast areas from radiation top of breast, around nipple and under right breast healing well   Tenosynovitis, de Quervain    left wrist   Wears glasses    Wears partial dentures upper flipper     Past Surgical History:  Procedure Laterality Date   BREAST LUMPECTOMY WITH RADIOACTIVE SEED LOCALIZATION Right 05/26/2021   Procedure: RIGHT BREAST LUMPECTOMY WITH RADIOACTIVE SEED LOCALIZATION;  Surgeon: Erroll Luna, MD;  Location: [place];  Service: General;  Laterality: Right;   CYSTO W/ RIGHT URETEROSCOPY  2006   CYSTO/ RIGHT URETERAL STENT PLACEMENT  07-29-2009   LARGE RIGHT RENAL CALCULUS   CYSTOLITHOLAPAXY OF LARGE BLADDER CALCULI/   RIGHT URETERAL STENT EXCHANGE  04-15-2010   CYSTOSCOPY WITH RETROGRADE PYELOGRAM, URETEROSCOPY AND STENT PLACEMENT Right 08/26/2021   Procedure: CYSTOSCOPY, URETEROSCOPY AND STENT  EXCHANGE, EXTRACTION OF RIGHT URETERAL STONE, FLUOROSCOPIC INTERPRETATION;  Surgeon: Franchot Gallo, MD;  Location: [place];  Service: Urology;  Laterality: Right;   CYSTOSCOPY WITH STENT PLACEMENT  07/04/2012   Procedure: CYSTOSCOPY WITH STENT PLACEMENT;  Surgeon: Franchot Gallo, MD;  Location: [facility];  Service: Urology;  Laterality: Right;  rt dbl j stent placement    CYSTOSCOPY  WITH STENT PLACEMENT Right 07/26/2021   Procedure: CYSTOSCOPY WITH STENT PLACEMENT;  Surgeon: Ardis Hughs, MD;  Location: [facility];  Service: Urology;  Laterality: Right;   CYSTOSCOPY/RETROGRADE/URETEROSCOPY/STONE EXTRACTION WITH BASKET  07/25/2012   Procedure: CYSTOSCOPY/RETROGRADE/URETEROSCOPY/STONE EXTRACTION WITH BASKET;  Surgeon: Franchot Gallo, MD;  Location[facility];  Service: Urology;  Laterality: Right;   EXTRACORPOREAL SHOCK WAVE LITHOTRIPSY  08-02-2009   RIGHT   HOLMIUM LASER APPLICATION  7/0/3500   Procedure: HOLMIUM LASER  APPLICATION;  Surgeon: Franchot Gallo, MD;  Location: [facility];  Service: Urology;  Laterality: Right;   HOLMIUM LASER APPLICATION Right 9/38/1829   Procedure: HOLMIUM LASER APPLICATION;  Surgeon: Franchot Gallo, MD;  Location: [facility];  Service: Urology;  Laterality: Right;   LAPAROSCOPIC TUBAL LIGATION Bilateral 01/17/2013   Procedure: LAPAROSCOPIC TUBAL LIGATION;  Surgeon: Melina Schools, MD;  Location: [facility];  Service: Gynecology;  Laterality: Bilateral;  1hr OR time    MINOR RELEASE DORSAL COMPARTMENT (DEQUERVAINS) Left 04/26/2021   Procedure: MINOR RELEASE DORSAL COMPARTMENT (DEQUERVAINS) WITH GANGLION EXCISION;  Surgeon: Renette Butters, MD;  Location: [place];  Service: Orthopedics;  Laterality: Left;    Current Outpatient Medications  Medication Instructions   ALPRAZolam (XANAX) 1 MG tablet Take 1 tablet by mouth 3 times daily.   amphetamine-dextroamphetamine (ADDERALL) 20 MG tablet Take 1 tablet by mouth 3 times daily   cephALEXin (KEFLEX) 500 MG capsule Take 1 capsule by mouth twice daily.   fluconazole (DIFLUCAN) 150 MG tablet Take 1 tablet by mouth and repeat after one week.   metroNIDAZOLE (FLAGYL) 500 MG tablet Take 1 tablet by mouth every 12 hours as directed for 7 days.   montelukast (SINGULAIR) 10 MG tablet Take 1 tablet by mouth every evening   omeprazole (PRILOSEC) 40 MG capsule Take 1 capsule by mouth 30 minutes before morning meal   SUMAtriptan (IMITREX) 50 MG tablet Take 1 tablet by mouth at onset of headache followed by 1 tablet 2 hours later if symptoms persist   tamoxifen (NOLVADEX) 10 mg, Oral, Daily    Allergies as of 01/10/2022 - Review Complete 01/10/2022  Allergen Reaction Noted   Dilaudid [hydromorphone hcl] Shortness Of Breath and Nausea And Vomiting 07/04/2012   Ceftriaxone sodium Other (See Comments) 07/22/2007   Latex Rash 10/31/2011    Family History  Problem Relation Age of Onset    Breast cancer Maternal Aunt 68       DCIS   Esophageal cancer Maternal Uncle    Stomach cancer Neg Hx    Colon cancer Neg Hx    Pancreatic cancer Neg Hx     Social History   Socioeconomic History   Marital status: Married    Spouse name: Not on file   Number of children: Not on file   Years of education: Not on file   Highest education level: Not on file  Occupational History   Not on file  Tobacco Use   Smoking status: Never   Smokeless tobacco: Never  Vaping Use   Vaping Use: Never used  Substance and Sexual Activity   Alcohol use: Not Currently    Comment: occasional   Drug use: Never   Sexual activity: Not on file  Other Topics Concern   Not on file  Social History Narrative   Not on file   Social Determinants of Health   Financial Resource Strain: Not on file  Food Insecurity: Not on file  Transportation Needs: Not  on file  Physical Activity: Not on file  Stress: Not on file  Social Connections: Not on file  Intimate Partner Violence: Not At Risk (05/18/2021)   Humiliation, Afraid, Rape, and Kick questionnaire    Fear of Current or Ex-Partner: No    Emotionally Abused: No    Physically Abused: No    Sexually Abused: No     Physical Exam: Ht '5\' 9"'$  (1.753 m)   Wt 211 lb (95.7 kg)   BMI 31.16 kg/m  Constitutional: generally well-appearing Psychiatric: alert and oriented x3 Eyes: extraocular movements intact Mouth: oral pharynx moist, no lesions Neck: supple no lymphadenopathy Cardiovascular: heart regular rate and rhythm Lungs: clear to auscultation bilaterally Abdomen: soft, nontender, nondistended, no obvious ascites, no peritoneal signs, normal bowel sounds Extremities: no lower extremity edema bilaterally Skin: no lesions on visible extremities   Assessment and plan: 46 y.o. female with routine risk for colon cancer, irregular bowel habits with mild chronic constipation, H. pylori serology positive  She was very unhappy with her experience  through [facility] gastroenterology 2 months ago and she will never go back to see them.  I am certainly glad to have her back as a patient and I am a bit surprised to hear that [facility] gastroenterology providers told her that I had retired.  Regardless she has changes in her bowel habits that I think are probably related to mild chronic constipation.  I recommended fiber supplement trial of Citrucel and that we get her up-to-date on colon cancer screening with a colonoscopy.  At the same time given this question raised by [facility] gastroenterology about possible H. pylori infection I recommended EGD with biopsies of her stomach.  I see no reason for blood tests or imaging studies prior to that.  Please see the "Patient Instructions" section for addition details about the plan.   Owens Loffler, MD [facility] 01/10/2022, 9:19 AM  Cc: [name], MD  Total time on date of encounter was 45  minutes (this included time spent preparing to see the patient reviewing records; obtaining and/or reviewing separately obtained history; performing a medically appropriate exam and/or evaluation; counseling and educating the patient and family if present; ordering medications, tests or procedures if applicable; and documenting clinical information in the health record).

## 2022-01-16 DIAGNOSIS — G4762 Sleep related leg cramps: Secondary | ICD-10-CM | POA: Diagnosis not present

## 2022-01-17 ENCOUNTER — Other Ambulatory Visit (HOSPITAL_COMMUNITY): Payer: Self-pay

## 2022-01-17 MED ORDER — AMPHETAMINE-DEXTROAMPHETAMINE 20 MG PO TABS
20.0000 mg | ORAL_TABLET | Freq: Three times a day (TID) | ORAL | 0 refills | Status: DC
  Filled 2022-01-17: qty 270, 90d supply, fill #0

## 2022-01-17 MED ORDER — ALPRAZOLAM 1 MG PO TABS
1.0000 mg | ORAL_TABLET | Freq: Three times a day (TID) | ORAL | 0 refills | Status: DC
  Filled 2022-01-17: qty 270, 90d supply, fill #0

## 2022-01-25 ENCOUNTER — Ambulatory Visit (INDEPENDENT_AMBULATORY_CARE_PROVIDER_SITE_OTHER): Payer: 59 | Admitting: Family Medicine

## 2022-01-25 ENCOUNTER — Encounter (INDEPENDENT_AMBULATORY_CARE_PROVIDER_SITE_OTHER): Payer: Self-pay

## 2022-01-30 ENCOUNTER — Other Ambulatory Visit (HOSPITAL_COMMUNITY): Payer: Self-pay

## 2022-01-30 MED ORDER — FLUCONAZOLE 150 MG PO TABS
ORAL_TABLET | ORAL | 0 refills | Status: DC
  Filled 2022-01-30: qty 2, 3d supply, fill #0

## 2022-01-31 ENCOUNTER — Other Ambulatory Visit (HOSPITAL_COMMUNITY): Payer: Self-pay

## 2022-02-08 ENCOUNTER — Ambulatory Visit (INDEPENDENT_AMBULATORY_CARE_PROVIDER_SITE_OTHER): Payer: 59 | Admitting: Family Medicine

## 2022-02-14 ENCOUNTER — Encounter: Payer: Self-pay | Admitting: Gastroenterology

## 2022-02-21 ENCOUNTER — Encounter: Payer: Self-pay | Admitting: Gastroenterology

## 2022-02-21 ENCOUNTER — Ambulatory Visit (AMBULATORY_SURGERY_CENTER): Payer: 59 | Admitting: Gastroenterology

## 2022-02-21 VITALS — BP 144/62 | HR 65 | Temp 96.6°F | Resp 11 | Ht 69.0 in | Wt 211.0 lb

## 2022-02-21 DIAGNOSIS — Z202 Contact with and (suspected) exposure to infections with a predominantly sexual mode of transmission: Secondary | ICD-10-CM | POA: Diagnosis not present

## 2022-02-21 DIAGNOSIS — R140 Abdominal distension (gaseous): Secondary | ICD-10-CM

## 2022-02-21 DIAGNOSIS — Z1389 Encounter for screening for other disorder: Secondary | ICD-10-CM | POA: Diagnosis not present

## 2022-02-21 DIAGNOSIS — K2960 Other gastritis without bleeding: Secondary | ICD-10-CM | POA: Diagnosis not present

## 2022-02-21 DIAGNOSIS — R198 Other specified symptoms and signs involving the digestive system and abdomen: Secondary | ICD-10-CM

## 2022-02-21 DIAGNOSIS — K449 Diaphragmatic hernia without obstruction or gangrene: Secondary | ICD-10-CM

## 2022-02-21 DIAGNOSIS — R1013 Epigastric pain: Secondary | ICD-10-CM

## 2022-02-21 DIAGNOSIS — K641 Second degree hemorrhoids: Secondary | ICD-10-CM | POA: Diagnosis not present

## 2022-02-21 DIAGNOSIS — F411 Generalized anxiety disorder: Secondary | ICD-10-CM | POA: Diagnosis not present

## 2022-02-21 DIAGNOSIS — K2950 Unspecified chronic gastritis without bleeding: Secondary | ICD-10-CM | POA: Diagnosis not present

## 2022-02-21 DIAGNOSIS — N898 Other specified noninflammatory disorders of vagina: Secondary | ICD-10-CM | POA: Diagnosis not present

## 2022-02-21 DIAGNOSIS — Z1211 Encounter for screening for malignant neoplasm of colon: Secondary | ICD-10-CM

## 2022-02-21 DIAGNOSIS — Z01411 Encounter for gynecological examination (general) (routine) with abnormal findings: Secondary | ICD-10-CM | POA: Diagnosis not present

## 2022-02-21 DIAGNOSIS — Z130 Encounter for screening for diseases of the blood and blood-forming organs and certain disorders involving the immune mechanism: Secondary | ICD-10-CM | POA: Diagnosis not present

## 2022-02-21 HISTORY — PX: COLONOSCOPY WITH ESOPHAGOGASTRODUODENOSCOPY (EGD): SHX5779

## 2022-02-21 MED ORDER — SODIUM CHLORIDE 0.9 % IV SOLN: 500.0000 mL | Freq: Once | INTRAVENOUS | Status: DC

## 2022-02-21 NOTE — Op Note (Signed)
[facility] Patient Name: Misty [name] Procedure Date: 02/21/2022 12:30 PM MRN: [id number] Endoscopist: Justice Britain , MD Age: 46 Referring MD:  Date of Birth: 1976-04-07 Gender: Female Account #: [id number] Procedure:                Colonoscopy Indications:              Screening for colorectal malignant neoplasm Medicines:                Monitored Anesthesia Care Procedure:                Pre-Anesthesia Assessment:                           - Prior to the procedure, a History and Physical                            was performed, and patient medications and                            allergies were reviewed. The patient's tolerance of                            previous anesthesia was also reviewed. The risks                            and benefits of the procedure and the sedation                            options and risks were discussed with the patient.                            All questions were answered, and informed consent                            was obtained. Prior Anticoagulants: The patient has                            taken no previous anticoagulant or antiplatelet                            agents. ASA Grade Assessment: II - A patient with                            mild systemic disease. After reviewing the risks                            and benefits, the patient was deemed in                            satisfactory condition to undergo the procedure.                           After obtaining informed consent, the colonoscope  was passed under direct vision. Throughout the                            procedure, the patient's blood pressure, pulse, and                            oxygen saturations were monitored continuously. The                            Olympus CF-HQ190L [id number]) Colonoscope was                            introduced through the anus and advanced to the the                            cecum,  identified by appendiceal orifice and                            ileocecal valve. The colonoscopy was performed                            without difficulty. The patient tolerated the                            procedure. The quality of the bowel preparation was                            adequate. The ileocecal valve, appendiceal orifice,                            and rectum were photographed. Scope In: 1:47:04 PM Scope Out: 1:57:18 PM Scope Withdrawal Time: 0 hours 7 minutes 28 seconds  Total Procedure Duration: 0 hours 10 minutes 14 seconds  Findings:                 Skin tags were found on perianal exam.                           The digital rectal exam findings include                            hemorrhoids. Pertinent negatives include no                            palpable rectal lesions.                           A few small-mouthed diverticula were found in the                            recto-sigmoid colon and sigmoid colon.                           Normal mucosa was found in the entire colon.  Non-bleeding non-thrombosed external and internal                            hemorrhoids were found during retroflexion, during                            perianal exam and during digital exam. The                            hemorrhoids were Grade II (internal hemorrhoids                            that prolapse but reduce spontaneously). Complications:            No immediate complications. Estimated Blood Loss:     Estimated blood loss: none. Impression:               - Perianal skin tags found on perianal exam.                           - Hemorrhoids found on digital rectal exam.                           - Diverticulosis in the recto-sigmoid colon and in                            the sigmoid colon.                           - Normal mucosa in the entire examined colon.                           - Non-bleeding non-thrombosed internal and external                             hemorrhoids. Recommendation:           - The patient will be observed post-procedure,                            until all discharge criteria are met.                           - Discharge patient to home.                           - Patient has a contact number available for                            emergencies. The signs and symptoms of potential                            delayed complications were discussed with the                            patient. Return to normal activities tomorrow.  Written discharge instructions were provided to the                            patient.                           - High fiber diet.                           - Use FiberCon 1-2 tablets PO daily.                           - Continue present medications.                           - Repeat colonoscopy in 10 years for screening                            purposes.                           - The findings and recommendations were discussed                            with the patient.                           - The findings and recommendations were discussed                            with the designated responsible adult. Justice Britain, MD 02/21/2022 2:04:55 PM

## 2022-02-21 NOTE — Progress Notes (Signed)
 GASTROENTEROLOGY PROCEDURE H&P NOTE   Primary Care Physician: White, Cynthia, MD  HPI: Misty [name] is a 46 y.o. female who presents for EGD/Colonoscopy for evaluation of abdominal gas/bloating, positive HP serology and difficulty with stool passage.  Past Medical History:  Diagnosis Date   ADHD (attention deficit hyperactivity disorder)    Anemia    Environmental and seasonal allergies    Frequency of urination    GAD (generalized anxiety disorder)    GERD (gastroesophageal reflux disease)    History of COVID-19 06/12/2020   result in epic;  per pt mild symptoms that resolved   History of hiatal hernia    History of kidney stones    History of radiation therapy    07-27-2021 to 08-22-2021   Hyperparathyroidism, primary ([facility])    currently followed by pcp  (previously seen endocrinologist-- dr levy lov note in epic 08-29-2017, doctor moved away)   Right Breast cancer ([facility]) 04/28/2021   Sepsis ([facility])    admit 07-26-2021   Skin abnormalities 08/23/2021   right breast areas from radiation top of breast, around nipple and under right breast healing well   Tenosynovitis, de Quervain    left wrist   Wears glasses    Wears partial dentures upper flipper    Past Surgical History:  Procedure Laterality Date   BREAST LUMPECTOMY WITH RADIOACTIVE SEED LOCALIZATION Right 05/26/2021   Procedure: RIGHT BREAST LUMPECTOMY WITH RADIOACTIVE SEED LOCALIZATION;  Surgeon: Cornett, Thomas, MD;  Location: [facility];  Service: General;  Laterality: Right;   CYSTO W/ RIGHT URETEROSCOPY  2006   CYSTO/ RIGHT URETERAL STENT PLACEMENT  07-29-2009   LARGE RIGHT RENAL CALCULUS   CYSTOLITHOLAPAXY OF LARGE BLADDER CALCULI/   RIGHT URETERAL STENT EXCHANGE  04-15-2010   CYSTOSCOPY WITH RETROGRADE PYELOGRAM, URETEROSCOPY AND STENT PLACEMENT Right 08/26/2021   Procedure: CYSTOSCOPY, URETEROSCOPY AND STENT  EXCHANGE, EXTRACTION OF RIGHT URETERAL STONE, FLUOROSCOPIC INTERPRETATION;  Surgeon:  Dahlstedt, Stephen, MD;  Location: [facility];  Service: Urology;  Laterality: Right;   CYSTOSCOPY WITH STENT PLACEMENT  07/04/2012   Procedure: CYSTOSCOPY WITH STENT PLACEMENT;  Surgeon: Stephen Dahlstedt, MD;  Location: [facility];  Service: Urology;  Laterality: Right;  rt dbl j stent placement    CYSTOSCOPY WITH STENT PLACEMENT Right 07/26/2021   Procedure: CYSTOSCOPY WITH STENT PLACEMENT;  Surgeon: Herrick, Benjamin W, MD;  Location: [facility];  Service: Urology;  Laterality: Right;   CYSTOSCOPY/RETROGRADE/URETEROSCOPY/STONE EXTRACTION WITH BASKET  07/25/2012   Procedure: CYSTOSCOPY/RETROGRADE/URETEROSCOPY/STONE EXTRACTION WITH BASKET;  Surgeon: Stephen Dahlstedt, MD;  Location: [facility];  Service: Urology;  Laterality: Right;   EXTRACORPOREAL SHOCK WAVE LITHOTRIPSY  08-02-2009   RIGHT   HOLMIUM LASER APPLICATION  07/25/2012   Procedure: HOLMIUM LASER APPLICATION;  Surgeon: Stephen Dahlstedt, MD;  Location: [facility];  Service: Urology;  Laterality: Right;   HOLMIUM LASER APPLICATION Right 08/26/2021   Procedure: HOLMIUM LASER APPLICATION;  Surgeon: Dahlstedt, Stephen, MD;  Location: [facility];  Service: Urology;  Laterality: Right;   LAPAROSCOPIC TUBAL LIGATION Bilateral 01/17/2013   Procedure: LAPAROSCOPIC TUBAL LIGATION;  Surgeon: Thomas F Henley, MD;  Location: [facility];  Service: Gynecology;  Laterality: Bilateral;  1hr OR time    MINOR RELEASE DORSAL COMPARTMENT (DEQUERVAINS) Left 04/26/2021   Procedure: MINOR RELEASE DORSAL COMPARTMENT (DEQUERVAINS) WITH GANGLION EXCISION;  Surgeon: Murphy, Timothy D, MD;  Location: [facility];  Service: Orthopedics;  Laterality: Left;   Current Outpatient Medications  Medication   Sig Dispense Refill   ALPRAZolam (XANAX) 1 MG tablet Take 1 tablet by mouth 3 times daily. 270 tablet 0   ALPRAZolam (XANAX) 1 MG tablet Take 1 tablet by mouth 3 times daily. 270 tablet 0    amphetamine-dextroamphetamine (ADDERALL) 20 MG tablet Take 1 tablet by mouth 3 times daily 270 tablet 0   amphetamine-dextroamphetamine (ADDERALL) 20 MG tablet Take 1 tablet by mouth 3 times daily 270 tablet 0   cephALEXin (KEFLEX) 500 MG capsule Take 1 capsule by mouth twice daily. 14 capsule 0   fluconazole (DIFLUCAN) 150 MG tablet Take 1 tab now and repeat in 3 days if symptoms persist. 2 tablet 0   methylcellulose (CITRUCEL) oral powder Take 1 packet by mouth daily.     montelukast (SINGULAIR) 10 MG tablet Take 1 tablet by mouth every evening (Patient taking differently: Take 10 mg by mouth as needed.) 90 tablet 0   Na Sulfate-K Sulfate-Mg Sulf (SUPREP BOWEL PREP KIT) 17.5-3.13-1.6 GM/177ML SOLN Take 1 kit by mouth as directed. 354 mL 0   omeprazole (PRILOSEC) 40 MG capsule Take 1 capsule by mouth 30 minutes before morning meal 90 capsule 3   SUMAtriptan (IMITREX) 50 MG tablet Take 1 tablet by mouth at onset of headache followed by 1 tablet 2 hours later if symptoms persist 9 tablet 0   tamoxifen (NOLVADEX) 10 MG tablet Take 1 tablet (10 mg total) by mouth daily. 90 tablet 3   No current facility-administered medications for this visit.    Current Outpatient Medications:    ALPRAZolam (XANAX) 1 MG tablet, Take 1 tablet by mouth 3 times daily., Disp: 270 tablet, Rfl: 0   ALPRAZolam (XANAX) 1 MG tablet, Take 1 tablet by mouth 3 times daily., Disp: 270 tablet, Rfl: 0   amphetamine-dextroamphetamine (ADDERALL) 20 MG tablet, Take 1 tablet by mouth 3 times daily, Disp: 270 tablet, Rfl: 0   amphetamine-dextroamphetamine (ADDERALL) 20 MG tablet, Take 1 tablet by mouth 3 times daily, Disp: 270 tablet, Rfl: 0   cephALEXin (KEFLEX) 500 MG capsule, Take 1 capsule by mouth twice daily., Disp: 14 capsule, Rfl: 0   fluconazole (DIFLUCAN) 150 MG tablet, Take 1 tab now and repeat in 3 days if symptoms persist., Disp: 2 tablet, Rfl: 0   methylcellulose (CITRUCEL) oral powder, Take 1 packet by mouth daily.,  Disp: , Rfl:    montelukast (SINGULAIR) 10 MG tablet, Take 1 tablet by mouth every evening (Patient taking differently: Take 10 mg by mouth as needed.), Disp: 90 tablet, Rfl: 0   Na Sulfate-K Sulfate-Mg Sulf (SUPREP BOWEL PREP KIT) 17.5-3.13-1.6 GM/177ML SOLN, Take 1 kit by mouth as directed., Disp: 354 mL, Rfl: 0   omeprazole (PRILOSEC) 40 MG capsule, Take 1 capsule by mouth 30 minutes before morning meal, Disp: 90 capsule, Rfl: 3   SUMAtriptan (IMITREX) 50 MG tablet, Take 1 tablet by mouth at onset of headache followed by 1 tablet 2 hours later if symptoms persist, Disp: 9 tablet, Rfl: 0   tamoxifen (NOLVADEX) 10 MG tablet, Take 1 tablet (10 mg total) by mouth daily., Disp: 90 tablet, Rfl: 3 Allergies  Allergen Reactions   Dilaudid [Hydromorphone Hcl] Shortness Of Breath and Nausea And Vomiting    Pt has takes percocet at home (see med rec).   Ceftriaxone Sodium Other (See Comments)    Hair loss with rocephrin   Latex Rash   Family History  Problem Relation Age of Onset   Breast cancer Maternal Aunt 68  DCIS   Esophageal cancer Maternal Uncle    Stomach cancer Neg Hx    Colon cancer Neg Hx    Pancreatic cancer Neg Hx    Social History   Socioeconomic History   Marital status: Married    Spouse name: Not on file   Number of children: Not on file   Years of education: Not on file   Highest education level: Not on file  Occupational History   Not on file  Tobacco Use   Smoking status: Never   Smokeless tobacco: Never  Vaping Use   Vaping Use: Never used  Substance and Sexual Activity   Alcohol use: Not Currently    Comment: occasional   Drug use: Never   Sexual activity: Not on file  Other Topics Concern   Not on file  Social History Narrative   Not on file   Social Determinants of Health   Financial Resource Strain: Not on file  Food Insecurity: Not on file  Transportation Needs: Not on file  Physical Activity: Not on file  Stress: Not on file  Social  Connections: Not on file  Intimate Partner Violence: Not At Risk (05/18/2021)   Humiliation, Afraid, Rape, and Kick questionnaire    Fear of Current or Ex-Partner: No    Emotionally Abused: No    Physically Abused: No    Sexually Abused: No    Physical Exam: There were no vitals filed for this visit. There is no height or weight on file to calculate BMI. GEN: NAD EYE: Sclerae anicteric ENT: MMM CV: Non-tachycardic GI: Soft, NT/ND NEURO:  Alert & Oriented x 3  Lab Results: No results for input(s): "WBC", "HGB", "HCT", "PLT" in the last 72 hours. BMET No results for input(s): "NA", "K", "CL", "CO2", "GLUCOSE", "BUN", "CREATININE", "CALCIUM" in the last 72 hours. LFT No results for input(s): "PROT", "ALBUMIN", "AST", "ALT", "ALKPHOS", "BILITOT", "BILIDIR", "IBILI" in the last 72 hours. PT/INR No results for input(s): "LABPROT", "INR" in the last 72 hours.   Impression / Plan: This is a 46 y.o.female who presents for EGD/Colonoscopy for evaluation of abdominal gas/bloating, positive HP serology and difficulty with stool passage.  The risks and benefits of endoscopic evaluation/treatment were discussed with the patient and/or family; these include but are not limited to the risk of perforation, infection, bleeding, missed lesions, lack of diagnosis, severe illness requiring hospitalization, as well as anesthesia and sedation related illnesses.  The patient's history has been reviewed, patient examined, no change in status, and deemed stable for procedure.  The patient and/or family is agreeable to proceed.    Gabriel Mansouraty, MD [facility] Advanced Endoscopy Office # [phone] 

## 2022-02-21 NOTE — Progress Notes (Signed)
Pt in recovery with monitors in place, VSS. Report given to receiving RN. Bite guard was placed with pt awake to ensure comfort. No dental or soft tissue damage noted. 

## 2022-02-21 NOTE — Progress Notes (Signed)
Called to room to assist during endoscopic procedure.  Patient ID and intended procedure confirmed with present staff. Received instructions for my participation in the procedure from the performing physician.  

## 2022-02-21 NOTE — Progress Notes (Signed)
Pt's states no medical or surgical changes since previsit or office visit. 

## 2022-02-21 NOTE — Patient Instructions (Signed)
Try to use FiberCon 1-2 tablets daily.  Continue your current medications.  Read all of the handouts given to you by your recovery room nurse.  YOU HAD AN ENDOSCOPIC PROCEDURE TODAY AT [facility]:   Refer to the procedure report that was given to you for any specific questions about what was found during the examination.  If the procedure report does not answer your questions, please call your gastroenterologist to clarify.  If you requested that your care partner not be given the details of your procedure findings, then the procedure report has been included in a sealed envelope for you to review at your convenience later.  YOU SHOULD EXPECT: Some feelings of bloating in the abdomen. Passage of more gas than usual.  Walking can help get rid of the air that was put into your GI tract during the procedure and reduce the bloating. If you had a lower endoscopy (such as a colonoscopy or flexible sigmoidoscopy) you may notice spotting of blood in your stool or on the toilet paper. If you underwent a bowel prep for your procedure, you may not have a normal bowel movement for a few days.  Please Note:  You might notice some irritation and congestion in your nose or some drainage.  This is from the oxygen used during your procedure.  There is no need for concern and it should clear up in a day or so.  SYMPTOMS TO REPORT IMMEDIATELY:  Following lower endoscopy (colonoscopy or flexible sigmoidoscopy):  Excessive amounts of blood in the stool  Significant tenderness or worsening of abdominal pains  Swelling of the abdomen that is new, acute  Fever of 100F or higher  Following upper endoscopy (EGD)  Vomiting of blood or coffee ground material  New chest pain or pain under the shoulder blades  Painful or persistently difficult swallowing  New shortness of breath  Fever of 100F or higher  Black, tarry-looking stools  For urgent or emergent issues, a gastroenterologist can be reached at  any hour by calling [phone]. Do not use MyChart messaging for urgent concerns.    DIET:  We do recommend a small meal at first, but then you may proceed to your regular diet.  Drink plenty of fluids but you should avoid alcoholic beverages for 24 hours. Increase the fiber In your diet, and drink plenty of water.  ACTIVITY:  You should plan to take it easy for the rest of today and you should NOT DRIVE or use heavy machinery until tomorrow (because of the sedation medicines used during the test).    FOLLOW UP: Our staff will call the number listed on your records the next business day following your procedure.  We will call around 7:15- 8:00 am to check on you and address any questions or concerns that you may have regarding the information given to you following your procedure. If we do not reach you, we will leave a message.  If you develop any symptoms (ie: fever, flu-like symptoms, shortness of breath, cough etc.) before then, please call [phone].  If you test positive for Covid 19 in the 2 weeks post procedure, please call and report this information to [place].    If any biopsies were taken you will be contacted by phone or by letter within the next 1-3 weeks.  Please call us at [phone] if you have not heard about the biopsies in 3 weeks.    SIGNATURES/CONFIDENTIALITY: You and/or your care partner have signed paperwork  which will be entered into your electronic medical record.  These signatures attest to the fact that that the information above on your After Visit Summary has been reviewed and is understood.  Full responsibility of the confidentiality of this discharge information lies with you and/or your care-partner.

## 2022-02-21 NOTE — Op Note (Signed)
[facility] Patient Name: Misty [name] Procedure Date: 02/21/2022 12:37 PM MRN: [id number] Endoscopist: Justice Britain , MD Age: 46 Referring MD:  Date of Birth: [birth year] Gender: Female Account #: [id number] Procedure:                Upper GI endoscopy Indications:              Epigastric abdominal pain, Abdominal bloating Medicines:                Monitored Anesthesia Care Procedure:                Pre-Anesthesia Assessment:                           - Prior to the procedure, a History and Physical                            was performed, and patient medications and                            allergies were reviewed. The patient's tolerance of                            previous anesthesia was also reviewed. The risks                            and benefits of the procedure and the sedation                            options and risks were discussed with the patient.                            All questions were answered, and informed consent                            was obtained. Prior Anticoagulants: The patient has                            taken no previous anticoagulant or antiplatelet                            agents. ASA Grade Assessment: II - A patient with                            mild systemic disease. After reviewing the risks                            and benefits, the patient was deemed in                            satisfactory condition to undergo the procedure.                           After obtaining informed consent, the endoscope was  passed under direct vision. Throughout the                            procedure, the patient's blood pressure, pulse, and                            oxygen saturations were monitored continuously. The                            Endoscope was introduced through the mouth, and                            advanced to the second part of duodenum. The upper                            GI  endoscopy was accomplished without difficulty.                            The patient tolerated the procedure. Scope In: Scope Out: Findings:                 No gross lesions were noted in the entire esophagus.                           The Z-line was irregular and was found 36 cm from                            the incisors.                           A 3 cm hiatal hernia was present.                           Patchy mildly erythematous mucosa without bleeding                            was found in the entire examined stomach. Biopsies                            were taken with a cold forceps for histology and                            Helicobacter pylori testing.                           No gross lesions were noted in the duodenal bulb,                            in the first portion of the duodenum and in the                            second portion of the duodenum. Biopsies were taken  with a cold forceps for histology. Complications:            No immediate complications. Estimated Blood Loss:     Estimated blood loss was minimal. Impression:               - No gross lesions in esophagus. Z-line irregular,                            36 cm from the incisors.                           - 3 cm hiatal hernia.                           - Erythematous mucosa in the stomach. Biopsied.                           - No gross lesions in the duodenal bulb, in the                            first portion of the duodenum and in the second                            portion of the duodenum. Biopsied. Recommendation:           - Proceed to scheduled colonoscopy.                           - Continue present medications.                           - Await pathology results.                           - The findings and recommendations were discussed                            with the patient.                           - The findings and recommendations were discussed                             with the designated responsible adult. Justice Britain, MD 02/21/2022 2:01:16 PM

## 2022-02-22 ENCOUNTER — Telehealth: Payer: Self-pay

## 2022-02-22 NOTE — Telephone Encounter (Signed)
  Follow up Call-     02/21/2022   12:41 PM  Call back number  Post procedure Call Back phone  # [phone]  Permission to leave phone message Yes     Left message

## 2022-02-28 ENCOUNTER — Encounter: Payer: Self-pay | Admitting: Gastroenterology

## 2022-03-02 ENCOUNTER — Other Ambulatory Visit (HOSPITAL_COMMUNITY): Payer: Self-pay

## 2022-03-03 ENCOUNTER — Other Ambulatory Visit (HOSPITAL_COMMUNITY): Payer: Self-pay

## 2022-03-03 MED ORDER — MONTELUKAST SODIUM 10 MG PO TABS
10.0000 mg | ORAL_TABLET | Freq: Every evening | ORAL | 0 refills | Status: AC
  Filled 2022-03-03: qty 90, 90d supply, fill #0

## 2022-03-06 ENCOUNTER — Other Ambulatory Visit (HOSPITAL_COMMUNITY): Payer: Self-pay

## 2022-03-06 MED ORDER — FLUCONAZOLE 150 MG PO TABS
150.0000 mg | ORAL_TABLET | Freq: Every day | ORAL | 0 refills | Status: DC
  Filled 2022-03-06: qty 2, 3d supply, fill #0

## 2022-03-30 ENCOUNTER — Ambulatory Visit
Admission: RE | Admit: 2022-03-30 | Discharge: 2022-03-30 | Disposition: A | Discharge status: Home / Self Care | Payer: 59 | Source: Ambulatory Visit | Attending: Adult Health | Admitting: Adult Health

## 2022-03-30 DIAGNOSIS — Z853 Personal history of malignant neoplasm of breast: Secondary | ICD-10-CM | POA: Diagnosis not present

## 2022-03-30 DIAGNOSIS — D0511 Intraductal carcinoma in situ of right breast: Secondary | ICD-10-CM

## 2022-03-30 DIAGNOSIS — R922 Inconclusive mammogram: Secondary | ICD-10-CM | POA: Diagnosis not present

## 2022-04-04 ENCOUNTER — Other Ambulatory Visit (HOSPITAL_COMMUNITY): Payer: Self-pay

## 2022-04-04 MED ORDER — FLUCONAZOLE 150 MG PO TABS
ORAL_TABLET | ORAL | 0 refills | Status: DC
  Filled 2022-04-04: qty 1, 1d supply, fill #0

## 2022-04-06 ENCOUNTER — Other Ambulatory Visit (HOSPITAL_COMMUNITY): Payer: Self-pay

## 2022-04-06 MED ORDER — NYSTATIN 100000 UNIT/GM EX OINT
TOPICAL_OINTMENT | CUTANEOUS | 3 refills | Status: DC
  Filled 2022-04-06: qty 30, 30d supply, fill #0
  Filled 2022-12-14: qty 30, 30d supply, fill #1
  Filled 2023-03-07: qty 30, 30d supply, fill #2

## 2022-04-07 ENCOUNTER — Other Ambulatory Visit (HOSPITAL_COMMUNITY): Payer: Self-pay

## 2022-04-24 ENCOUNTER — Other Ambulatory Visit (HOSPITAL_COMMUNITY): Payer: Self-pay

## 2022-04-24 MED ORDER — AMPHETAMINE-DEXTROAMPHETAMINE 20 MG PO TABS
20.0000 mg | ORAL_TABLET | Freq: Three times a day (TID) | ORAL | 0 refills | Status: DC
  Filled 2022-04-24: qty 270, 90d supply, fill #0

## 2022-04-24 MED ORDER — ALPRAZOLAM 1 MG PO TABS
1.0000 mg | ORAL_TABLET | Freq: Three times a day (TID) | ORAL | 0 refills | Status: DC
  Filled 2022-04-24: qty 270, 90d supply, fill #0

## 2022-04-25 ENCOUNTER — Other Ambulatory Visit (HOSPITAL_COMMUNITY): Payer: Self-pay

## 2022-04-26 ENCOUNTER — Other Ambulatory Visit (HOSPITAL_COMMUNITY): Payer: Self-pay

## 2022-04-27 ENCOUNTER — Other Ambulatory Visit (HOSPITAL_COMMUNITY): Payer: Self-pay

## 2022-05-01 ENCOUNTER — Other Ambulatory Visit (HOSPITAL_COMMUNITY): Payer: Self-pay

## 2022-05-01 MED ORDER — ONDANSETRON 4 MG PO TBDP
4.0000 mg | ORAL_TABLET | Freq: Four times a day (QID) | ORAL | 1 refills | Status: AC | PRN
  Filled 2022-05-01: qty 30, 8d supply, fill #0
  Filled 2023-01-09: qty 30, 8d supply, fill #1

## 2022-05-16 ENCOUNTER — Encounter (HOSPITAL_COMMUNITY): Payer: Self-pay

## 2022-05-16 ENCOUNTER — Other Ambulatory Visit: Payer: Self-pay

## 2022-05-16 ENCOUNTER — Emergency Department (HOSPITAL_COMMUNITY)
Admission: EM | Admit: 2022-05-16 | Discharge: 2022-05-16 | Disposition: A | Discharge status: Home / Self Care | Payer: 59 | Attending: Emergency Medicine | Admitting: Emergency Medicine

## 2022-05-16 DIAGNOSIS — R0981 Nasal congestion: Secondary | ICD-10-CM | POA: Diagnosis present

## 2022-05-16 DIAGNOSIS — Z8616 Personal history of COVID-19: Secondary | ICD-10-CM | POA: Diagnosis not present

## 2022-05-16 DIAGNOSIS — Z20822 Contact with and (suspected) exposure to covid-19: Secondary | ICD-10-CM | POA: Insufficient documentation

## 2022-05-16 DIAGNOSIS — Z91040 Latex allergy status: Secondary | ICD-10-CM | POA: Diagnosis not present

## 2022-05-16 DIAGNOSIS — J069 Acute upper respiratory infection, unspecified: Secondary | ICD-10-CM | POA: Diagnosis not present

## 2022-05-16 LAB — BASIC METABOLIC PANEL
Anion gap: 6 (ref 5–15)
BUN: 8 mg/dL (ref 6–20)
CO2: 22 mmol/L (ref 22–32)
Calcium: 10.3 mg/dL (ref 8.9–10.3)
Chloride: 110 mmol/L (ref 98–111)
Creatinine, Ser: 0.82 mg/dL (ref 0.44–1.00)
GFR, Estimated: >60 mL/min (ref >60)
Glucose, Bld: 90 mg/dL (ref 70–99)
Potassium: 3.6 mmol/L (ref 3.5–5.1)
Sodium: 138 mmol/L (ref 135–145)

## 2022-05-16 LAB — RESP PANEL BY RT-PCR (FLU A&B, COVID) ARPGX2
Influenza A by PCR: NEGATIVE
Influenza B by PCR: NEGATIVE
SARS Coronavirus 2 by RT PCR: NEGATIVE

## 2022-05-16 LAB — GROUP A STREP BY PCR: Group A Strep by PCR: NOT DETECTED

## 2022-05-16 MED ORDER — ACETAMINOPHEN 325 MG PO TABS
650.0000 mg | ORAL_TABLET | Freq: Once | ORAL | Status: AC
  Administered 2022-05-16: 650 mg via ORAL
  Filled 2022-05-16: qty 2

## 2022-05-16 MED ORDER — KETOROLAC TROMETHAMINE 15 MG/ML IJ SOLN
15.0000 mg | Freq: Once | INTRAMUSCULAR | Status: AC
  Administered 2022-05-16: 15 mg via INTRAMUSCULAR
  Filled 2022-05-16: qty 1

## 2022-05-16 MED ORDER — GUAIFENESIN-DM 100-10 MG/5ML PO SYRP
5.0000 mL | ORAL_SOLUTION | Freq: Three times a day (TID) | ORAL | 0 refills | Status: DC | PRN
  Filled 2022-05-16: qty 118, 8d supply, fill #0

## 2022-05-16 NOTE — ED Triage Notes (Signed)
Pt c/o chills, headache, congestion, runny nose, decreased appetite, cough x4 days. Pt states OTC meds have given no relief. Pt c/o symptoms worsening today. Pt denies emesis, denies diarrhea. Pt reports highest temp at 99.4

## 2022-05-16 NOTE — ED Provider Notes (Signed)
[facility] DEPT Provider Note   CSN: [id number] Arrival date & time: 05/16/22  1622     History  Chief Complaint  Patient presents with   Flu- Like Symptoms    Misty [name] is a 46 y.o. female.  HPI Patient presents with headache chills nausea and congestion, URI symptoms.  Has had for around 4 days.  Decreased appetite.  Has had some chills.  However temperature only up to 99.4.  No known sick contacts but does work here in the [place].  Denies pregnancy.  Occasional cough with nonproduction.  Hurts to swallow.    Home Medications Prior to Admission medications   Medication Sig Start Date End Date Taking? Authorizing Provider  guaiFENesin-dextromethorphan (ROBITUSSIN DM) 100-10 MG/5ML syrup Take 5 mLs by mouth 3 (three) times daily as needed for cough. 05/16/22  Yes Davonna Belling, MD  ALPRAZolam Duanne Moron) 1 MG tablet Take 1 tablet by mouth 3 times daily. 10/12/21     ALPRAZolam (XANAX) 1 MG tablet Take 1 tablet by mouth 3 times daily. 04/24/22     amphetamine-dextroamphetamine (ADDERALL) 20 MG tablet Take 1 tablet by mouth 3 times daily 10/12/21     amphetamine-dextroamphetamine (ADDERALL) 20 MG tablet Take 1 tablet by mouth 3 times daily 04/24/22     fluconazole (DIFLUCAN) 150 MG tablet Take 1 tablet by mouth now as directed. 04/04/22     methylcellulose (CITRUCEL) oral powder Take 1 packet by mouth daily. 01/10/22   Milus Banister, MD  montelukast (SINGULAIR) 10 MG tablet Take 1 tablet (10 mg total) by mouth every evening. 03/02/22     nystatin ointment (MYCOSTATIN) APPLY TO THE AFFECTED AREA(S) BY TOPICAL ROUTE 3 TIMES PER DAY AS NEEDED 04/06/22     omeprazole (PRILOSEC) 40 MG capsule Take 1 capsule by mouth 30 minutes before morning meal 11/02/21     ondansetron (ZOFRAN-ODT) 4 MG disintegrating tablet Dissolve 1 tablet (4 mg total) in the mouth every 6 (six) hours as needed for nausea 04/30/22   Marney Setting, NP  SUMAtriptan (IMITREX) 50 MG  tablet Take 1 tablet by mouth at onset of headache followed by 1 tablet 2 hours later if symptoms persist 06/07/21     tamoxifen (NOLVADEX) 10 MG tablet Take 1 tablet (10 mg total) by mouth daily. 09/05/21   Nicholas Lose, MD      Allergies    Dilaudid [hydromorphone hcl], Ceftriaxone sodium, and Latex    Review of Systems   Review of Systems  Physical Exam Updated Vital Signs BP 122/81 (BP Location: Right Arm)   Pulse 85   Temp 98.5 F (36.9 C) (Oral)   Resp 18   SpO2 98%  Physical Exam Vitals and nursing note reviewed.  HENT:     Head: Normocephalic.     Mouth/Throat:     Pharynx: Posterior oropharyngeal erythema present. No oropharyngeal exudate.  Cardiovascular:     Rate and Rhythm: Regular rhythm.  Pulmonary:     Breath sounds: No wheezing, rhonchi or rales.  Skin:    General: Skin is warm.  Neurological:     Mental Status: She is alert and oriented to person, place, and time.     ED Results / Procedures / Treatments   Labs (all labs ordered are listed, but only abnormal results are displayed) Labs Reviewed  RESP PANEL BY RT-PCR (FLU A&B, COVID) ARPGX2  GROUP A STREP BY PCR  BASIC METABOLIC PANEL    EKG None  Radiology No results found.  Procedures Procedures    Medications Ordered in ED Medications  acetaminophen (TYLENOL) tablet 650 mg (650 mg Oral Given 05/16/22 1750)  ketorolac (TORADOL) 15 MG/ML injection 15 mg (15 mg Intramuscular Given 05/16/22 1751)    ED Course/ Medical Decision Making/ A&P                           Medical Decision Making Amount and/or Complexity of Data Reviewed Labs: ordered.  Risk OTC drugs. Prescription drug management.   Patient with URI symptoms.  Cough.  Lungs clear.  Clinically doubt a pneumonia.  No rales or rhonchi.  With erythema posteriorly will get strep test.  Also will get flu and COVID testing.  Well-appearing.  Flu and COVID testing negative.  Well-appearing.  States she had some muscle clamps  and has had low potassium in the past.  Basic metabolic panel interpreted and reassuring.  Will discharge home with symptomatic treatment for her URI.        Final Clinical Impression(s) / ED Diagnoses Final diagnoses:  Upper respiratory tract infection, unspecified type    Rx / DC Orders ED Discharge Orders          Ordered    guaiFENesin-dextromethorphan (ROBITUSSIN DM) 100-10 MG/5ML syrup  3 times daily PRN        05/16/22 Sebastian Ache, MD 05/16/22 2004

## 2022-05-17 ENCOUNTER — Other Ambulatory Visit (HOSPITAL_COMMUNITY): Payer: Self-pay

## 2022-05-29 ENCOUNTER — Other Ambulatory Visit (HOSPITAL_COMMUNITY): Payer: Self-pay

## 2022-05-29 DIAGNOSIS — R0981 Nasal congestion: Secondary | ICD-10-CM | POA: Diagnosis not present

## 2022-05-29 DIAGNOSIS — J329 Chronic sinusitis, unspecified: Secondary | ICD-10-CM | POA: Diagnosis not present

## 2022-05-29 MED ORDER — FLUCONAZOLE 150 MG PO TABS
ORAL_TABLET | ORAL | 0 refills | Status: DC
  Filled 2022-05-29: qty 2, 3d supply, fill #0

## 2022-05-30 ENCOUNTER — Other Ambulatory Visit (HOSPITAL_COMMUNITY): Payer: Self-pay

## 2022-05-30 MED ORDER — AMOXICILLIN-POT CLAVULANATE 875-125 MG PO TABS
1.0000 | ORAL_TABLET | Freq: Two times a day (BID) | ORAL | 0 refills | Status: DC
  Filled 2022-05-30: qty 14, 7d supply, fill #0

## 2022-05-30 NOTE — Progress Notes (Incomplete)
Patient Care Team: [name], MD as PCP - General (Family Medicine) Erroll Luna, MD as Consulting Physician (General Surgery) Kyung Rudd, MD as Consulting Physician (Radiation Oncology) Nicholas Lose, MD as Consulting Physician (Hematology and Oncology)  DIAGNOSIS: No diagnosis found.  SUMMARY OF ONCOLOGIC HISTORY: Oncology History  Ductal carcinoma in situ (DCIS) of right breast  04/28/2021 Initial Diagnosis   Screening mammogram detected right breast mass at 4 o'clock position 10 cm from the nipple measuring 2.8 x 1.5 x 3.3 cm, axilla negative, biopsy revealed low to intermediate grade DCIS involving papillary lesion, ER/PR positive   05/26/2021 Surgery   (Dr. Brantley Stage): Right lumpectomy: Intermediate grade DCIS involving a papillary lesion 5.2 cm, no evidence of invasive cancer, resection margins are negative, ER 100%, PR 100%   06/17/2021 Genetic Testing   The [facility] CustomNext Panel found no pathogenic mutations.    The CustomNext-Cancer+RNAinsight panel offered by [facility] includes sequencing and rearrangement analysis for the following 47 genes:  APC, ATM, AXIN2, BARD1, BMPR1A, BRCA1, BRCA2, BRIP1, CDH1, CDK4, CDKN2A, CHEK2, CTNNA1, DICER1, EPCAM, GREM1, HOXB13, KIT, MEN1, MLH1, MSH2, MSH3, MSH6, MUTYH, NBN, NF1, NTHL1, PALB2, PDGFRA, PMS2, POLD1, POLE, PTEN, RAD50, RAD51C, RAD51D, SDHA, SDHB, SDHC, SDHD, SMAD4, SMARCA4, STK11, TP53, TSC1, TSC2, and VHL.  RNA data is routinely analyzed for use in variant interpretation for all genes.   07/05/2021 - 08/22/2021 Radiation Therapy   Site Technique Total Dose (Gy) Dose per Fx (Gy) Completed Fx Beam Energies  Breast, Right: Breast_R 3D 50.4/50.4 1.8 28/28 6XFFF  Breast, Right: Breast_R_Bst 3D 10/10 2 5/5 6X, 10X     09/17/2021 -  Anti-estrogen oral therapy   10 mg Tamoxifen daily   11/29/2021 Cancer Staging   Staging form: Breast, AJCC 8th Edition - Clinical: Stage 0 (cTis (DCIS), cN0, cM0) - Signed by Gardenia Phlegm, NP on 11/29/2021 Stage prefix: Initial diagnosis     CHIEF COMPLIANT: Follow-up Tamoxifen   INTERVAL HISTORY: Misty [name] is a  46 y.o. with above-mentioned history of right breast DCIS having undergone right lumpectomy. She presents to the clinic today for follow-up.    ALLERGIES:  is allergic to dilaudid [hydromorphone hcl], ceftriaxone sodium, and latex.  MEDICATIONS:  Current Outpatient Medications  Medication Sig Dispense Refill   ALPRAZolam (XANAX) 1 MG tablet Take 1 tablet by mouth 3 times daily. 270 tablet 0   ALPRAZolam (XANAX) 1 MG tablet Take 1 tablet by mouth 3 times daily. 270 tablet 0   amoxicillin-clavulanate (AUGMENTIN) 875-125 MG tablet Take 1 tablet by mouth every 12 (twelve) hours for 7 days 14 tablet 0   amphetamine-dextroamphetamine (ADDERALL) 20 MG tablet Take 1 tablet by mouth 3 times daily 270 tablet 0   amphetamine-dextroamphetamine (ADDERALL) 20 MG tablet Take 1 tablet by mouth 3 times daily 270 tablet 0   fluconazole (DIFLUCAN) 150 MG tablet Take 1 tablet by mouth now, may repeat in 72 hours 2 tablet 0   guaiFENesin-dextromethorphan (ROBITUSSIN DM) 100-10 MG/5ML syrup Take 5 mLs by mouth 3 (three) times daily as needed for cough. 118 mL 0   methylcellulose (CITRUCEL) oral powder Take 1 packet by mouth daily.     montelukast (SINGULAIR) 10 MG tablet Take 1 tablet (10 mg total) by mouth every evening. 90 tablet 0   nystatin ointment (MYCOSTATIN) APPLY TO THE AFFECTED AREA(S) BY TOPICAL ROUTE 3 TIMES PER DAY AS NEEDED 30 g 3   omeprazole (PRILOSEC) 40 MG capsule Take 1 capsule by mouth 30 minutes before morning meal  90 capsule 3   ondansetron (ZOFRAN-ODT) 4 MG disintegrating tablet Dissolve 1 tablet (4 mg total) in the mouth every 6 (six) hours as needed for nausea 30 tablet 1   SUMAtriptan (IMITREX) 50 MG tablet Take 1 tablet by mouth at onset of headache followed by 1 tablet 2 hours later if symptoms persist 9 tablet 0   tamoxifen (NOLVADEX) 10 MG  tablet Take 1 tablet (10 mg total) by mouth daily. 90 tablet 3   No current facility-administered medications for this visit.    PHYSICAL EXAMINATION: ECOG PERFORMANCE STATUS: {CHL ONC ECOG [phone]}  There were no vitals filed for this visit. There were no vitals filed for this visit.  BREAST:*** No palpable masses or nodules in either right or left breasts. No palpable axillary supraclavicular or infraclavicular adenopathy no breast tenderness or nipple discharge. (exam performed in the presence of a chaperone)  LABORATORY DATA:  I have reviewed the data as listed    Latest Ref Rng & Units 05/16/2022    6:30 PM 07/28/2021    7:26 AM 07/27/2021    4:01 AM  CMP  Glucose 70 - 99 mg/dL 90  135  159   BUN 6 - 20 mg/dL _0 Creatinine 0.44 - 1.00 mg/dL 0.82  0.79  1.25   Sodium 135 - 145 mmol/L 138  136  135   Potassium 3.5 - 5.1 mmol/L 3.6  3.2  3.6   Chloride 98 - 111 mmol/L 110  107  107   CO2 22 - 32 mmol/L _1 Calcium 8.9 - 10.3 mg/dL 10.3  10.0  9.7   Total Protein 6.5 - 8.1 g/dL   5.9   Total Bilirubin 0.3 - 1.2 mg/dL   1.4   Alkaline Phos 38 - 126 U/L   49   AST 15 - 41 U/L   25   ALT 0 - 44 U/L   24     Lab Results  Component Value Date   WBC 26.3 (H) 07/28/2021   HGB 11.6 (L) 07/28/2021   HCT 35.7 (L) 07/28/2021   MCV 79.5 (L) 07/28/2021   PLT 117 (L) 07/28/2021   NEUTROABS 5.0 07/25/2021    ASSESSMENT & PLAN:  No problem-specific Assessment & Plan notes found for this encounter.    No orders of the defined types were placed in this encounter.  The patient has a good understanding of the overall plan. she agrees with it. she will call with any problems that may develop before the next visit here. Total time spent: 30 mins including face to face time and time spent for planning, charting and co-ordination of care   Suzzette Righter, [place] 05/30/22    I Gardiner Coins am acting as a Education administrator for [employer]  ***

## 2022-05-31 ENCOUNTER — Ambulatory Visit: Payer: 59 | Admitting: Hematology and Oncology

## 2022-05-31 NOTE — Assessment & Plan Note (Deleted)
05/26/2021: (Dr. Brantley Stage): Right lumpectomy: Intermediate grade DCIS involving a papillary lesion 5.2 cm, no evidence of invasive cancer, resection margins are negative, ER 100%, PR 100%   Treatment plan: 1. adjuvant radiation therapy 07/27/2021-08/22/2021 2. Followed by antiestrogen therapy with tamoxifen 5 years.  I encouraged her to start the tamoxifen at 10 mg daily 09/17/2021   Sepsis with pyelonephritis and hydronephrosis: Stent was placed in the hospital.    Tamoxifen toxicities: 1.  Fatigue issues: I instructed her to take tamoxifen at bedtime. Denies any worsening of hot flashes or arthralgias or myalgias. She has pre-existing mood swings but have not gotten any worse.   Breast cancer surveillance:  Mammograms: 03/30/2022: Benign breast density category C 2. Breast exam 10/06/2021: Benign  Return to clinic in 1 year for follow-up

## 2022-06-06 DIAGNOSIS — F900 Attention-deficit hyperactivity disorder, predominantly inattentive type: Secondary | ICD-10-CM | POA: Diagnosis not present

## 2022-06-06 DIAGNOSIS — F4310 Post-traumatic stress disorder, unspecified: Secondary | ICD-10-CM | POA: Diagnosis not present

## 2022-06-06 DIAGNOSIS — F410 Panic disorder [episodic paroxysmal anxiety] without agoraphobia: Secondary | ICD-10-CM | POA: Diagnosis not present

## 2022-06-13 ENCOUNTER — Other Ambulatory Visit (HOSPITAL_COMMUNITY): Payer: Self-pay

## 2022-06-17 NOTE — Progress Notes (Incomplete)
Patient Care Team: [name], MD as PCP - General (Family Medicine) Erroll Luna, MD as Consulting Physician (General Surgery) Kyung Rudd, MD as Consulting Physician (Radiation Oncology) Nicholas Lose, MD as Consulting Physician (Hematology and Oncology)  DIAGNOSIS: No diagnosis found.  SUMMARY OF ONCOLOGIC HISTORY: Oncology History  Ductal carcinoma in situ (DCIS) of right breast  04/28/2021 Initial Diagnosis   Screening mammogram detected right breast mass at 4 o'clock position 10 cm from the nipple measuring 2.8 x 1.5 x 3.3 cm, axilla negative, biopsy revealed low to intermediate grade DCIS involving papillary lesion, ER/PR positive   05/26/2021 Surgery   (Dr. Brantley Stage): Right lumpectomy: Intermediate grade DCIS involving a papillary lesion 5.2 cm, no evidence of invasive cancer, resection margins are negative, ER 100%, PR 100%   06/17/2021 Genetic Testing   The [facility] CustomNext Panel found no pathogenic mutations.    The CustomNext-Cancer+RNAinsight panel offered by [facility] includes sequencing and rearrangement analysis for the following 47 genes:  APC, ATM, AXIN2, BARD1, BMPR1A, BRCA1, BRCA2, BRIP1, CDH1, CDK4, CDKN2A, CHEK2, CTNNA1, DICER1, EPCAM, GREM1, HOXB13, KIT, MEN1, MLH1, MSH2, MSH3, MSH6, MUTYH, NBN, NF1, NTHL1, PALB2, PDGFRA, PMS2, POLD1, POLE, PTEN, RAD50, RAD51C, RAD51D, SDHA, SDHB, SDHC, SDHD, SMAD4, SMARCA4, STK11, TP53, TSC1, TSC2, and VHL.  RNA data is routinely analyzed for use in variant interpretation for all genes.   07/05/2021 - 08/22/2021 Radiation Therapy   Site Technique Total Dose (Gy) Dose per Fx (Gy) Completed Fx Beam Energies  Breast, Right: Breast_R 3D 50.4/50.4 1.8 28/28 6XFFF  Breast, Right: Breast_R_Bst 3D 10/10 2 5/5 6X, 10X     09/17/2021 -  Anti-estrogen oral therapy   10 mg Tamoxifen daily   11/29/2021 Cancer Staging   Staging form: Breast, AJCC 8th Edition - Clinical: Stage 0 (cTis (DCIS), cN0, cM0) - Signed by Gardenia Phlegm, NP on 11/29/2021 Stage prefix: Initial diagnosis     CHIEF COMPLIANT:  Follow-up Tamoxifen   INTERVAL HISTORY: Misty [name] is a 46 y.o. with above-mentioned history of right breast DCIS having undergone right lumpectomy. She presents to the clinic today for follow-up.   ALLERGIES:  is allergic to dilaudid [hydromorphone hcl], ceftriaxone sodium, and latex.  MEDICATIONS:  Current Outpatient Medications  Medication Sig Dispense Refill   ALPRAZolam (XANAX) 1 MG tablet Take 1 tablet by mouth 3 times daily. 270 tablet 0   ALPRAZolam (XANAX) 1 MG tablet Take 1 tablet by mouth 3 times daily. 270 tablet 0   amoxicillin-clavulanate (AUGMENTIN) 875-125 MG tablet Take 1 tablet by mouth every 12 (twelve) hours for 7 days 14 tablet 0   amphetamine-dextroamphetamine (ADDERALL) 20 MG tablet Take 1 tablet by mouth 3 times daily 270 tablet 0   amphetamine-dextroamphetamine (ADDERALL) 20 MG tablet Take 1 tablet by mouth 3 times daily 270 tablet 0   fluconazole (DIFLUCAN) 150 MG tablet Take 1 tablet by mouth now, may repeat in 72 hours 2 tablet 0   guaiFENesin-dextromethorphan (ROBITUSSIN DM) 100-10 MG/5ML syrup Take 5 mLs by mouth 3 (three) times daily as needed for cough. 118 mL 0   methylcellulose (CITRUCEL) oral powder Take 1 packet by mouth daily.     montelukast (SINGULAIR) 10 MG tablet Take 1 tablet (10 mg total) by mouth every evening. 90 tablet 0   nystatin ointment (MYCOSTATIN) APPLY TO THE AFFECTED AREA(S) BY TOPICAL ROUTE 3 TIMES PER DAY AS NEEDED 30 g 3   omeprazole (PRILOSEC) 40 MG capsule Take 1 capsule by mouth 30 minutes before morning meal 90  capsule 3   ondansetron (ZOFRAN-ODT) 4 MG disintegrating tablet Dissolve 1 tablet (4 mg total) in the mouth every 6 (six) hours as needed for nausea 30 tablet 1   SUMAtriptan (IMITREX) 50 MG tablet Take 1 tablet by mouth at onset of headache followed by 1 tablet 2 hours later if symptoms persist 9 tablet 0   tamoxifen (NOLVADEX) 10 MG  tablet Take 1 tablet (10 mg total) by mouth daily. 90 tablet 3   No current facility-administered medications for this visit.    PHYSICAL EXAMINATION: ECOG PERFORMANCE STATUS: {CHL ONC ECOG [phone]}  There were no vitals filed for this visit. There were no vitals filed for this visit.  BREAST:*** No palpable masses or nodules in either right or left breasts. No palpable axillary supraclavicular or infraclavicular adenopathy no breast tenderness or nipple discharge. (exam performed in the presence of a chaperone)  LABORATORY DATA:  I have reviewed the data as listed    Latest Ref Rng & Units 05/16/2022    6:30 PM 07/28/2021    7:26 AM 07/27/2021    4:01 AM  CMP  Glucose 70 - 99 mg/dL 90  135  159   BUN 6 - 20 mg/dL _0 Creatinine 0.44 - 1.00 mg/dL 0.82  0.79  1.25   Sodium 135 - 145 mmol/L 138  136  135   Potassium 3.5 - 5.1 mmol/L 3.6  3.2  3.6   Chloride 98 - 111 mmol/L 110  107  107   CO2 22 - 32 mmol/L _1 Calcium 8.9 - 10.3 mg/dL 10.3  10.0  9.7   Total Protein 6.5 - 8.1 g/dL   5.9   Total Bilirubin 0.3 - 1.2 mg/dL   1.4   Alkaline Phos 38 - 126 U/L   49   AST 15 - 41 U/L   25   ALT 0 - 44 U/L   24     Lab Results  Component Value Date   WBC 26.3 (H) 07/28/2021   HGB 11.6 (L) 07/28/2021   HCT 35.7 (L) 07/28/2021   MCV 79.5 (L) 07/28/2021   PLT 117 (L) 07/28/2021   NEUTROABS 5.0 07/25/2021    ASSESSMENT & PLAN:  No problem-specific Assessment & Plan notes found for this encounter.    No orders of the defined types were placed in this encounter.  The patient has a good understanding of the overall plan. she agrees with it. she will call with any problems that may develop before the next visit here. Total time spent: 30 mins including face to face time and time spent for planning, charting and co-ordination of care   Suzzette Righter, [place] 06/17/22    I Gardiner Coins am acting as a Education administrator for [employer]  ***

## 2022-06-20 ENCOUNTER — Encounter: Payer: Self-pay | Admitting: Genetic Counselor

## 2022-06-20 ENCOUNTER — Telehealth: Payer: Self-pay | Admitting: Genetic Counselor

## 2022-06-20 NOTE — Telephone Encounter (Signed)
I contacted Misty [name] to discuss her updated genetic testing results. In December 2022, Misty [name] had negative hereditary cancer genetic testing for 47 genes. 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 Her result is now Positive. The HOXB13 gene is associated with an increased risk for prostate cancer. We will write a letter explaining the amended report and mail both the letter and the amended report to her home.   [name], MS, [facility] Genetic Counselor [email or web address] (P) [phone]

## 2022-06-20 NOTE — Assessment & Plan Note (Signed)
05/26/2021: (Dr. Brantley Stage): Right lumpectomy: Intermediate grade DCIS involving a papillary lesion 5.2 cm, no evidence of invasive cancer, resection margins are negative, ER 100%, PR 100%   Treatment plan: 1. adjuvant radiation therapy 07/27/2021-08/22/2021 2. Followed by antiestrogen therapy with tamoxifen 5 years.  I encouraged her to start the tamoxifen at 10 mg daily 09/17/2021   Sepsis with pyelonephritis and hydronephrosis: Stent was placed in the hospital.    Tamoxifen toxicities: 1.  Fatigue issues: I instructed her to take tamoxifen at bedtime. Denies any worsening of hot flashes or arthralgias or myalgias. She has pre-existing mood swings but have not gotten any worse.   Breast cancer surveillance: Mammograms to be done October 2023 Breast exam 10/06/2021: Benign  Return to clinic in 1 year for follow-up

## 2022-06-21 ENCOUNTER — Other Ambulatory Visit (HOSPITAL_COMMUNITY): Payer: Self-pay

## 2022-06-21 ENCOUNTER — Inpatient Hospital Stay: Payer: Commercial Managed Care - PPO | Attending: Hematology and Oncology | Admitting: Hematology and Oncology

## 2022-06-21 ENCOUNTER — Other Ambulatory Visit: Payer: Self-pay

## 2022-06-21 VITALS — BP 125/89 | HR 80 | Temp 97.9°F | Resp 18 | Ht 69.0 in | Wt 190.0 lb

## 2022-06-21 DIAGNOSIS — Z79810 Long term (current) use of selective estrogen receptor modulators (SERMs): Secondary | ICD-10-CM | POA: Diagnosis not present

## 2022-06-21 DIAGNOSIS — D0511 Intraductal carcinoma in situ of right breast: Secondary | ICD-10-CM | POA: Insufficient documentation

## 2022-06-21 DIAGNOSIS — Z79899 Other long term (current) drug therapy: Secondary | ICD-10-CM | POA: Diagnosis not present

## 2022-06-21 DIAGNOSIS — Z923 Personal history of irradiation: Secondary | ICD-10-CM | POA: Diagnosis not present

## 2022-06-21 MED ORDER — TAMOXIFEN CITRATE 10 MG PO TABS
10.0000 mg | ORAL_TABLET | Freq: Every day | ORAL | 3 refills | Status: DC
  Filled 2022-06-21 – 2022-09-04 (×2): qty 90, 90d supply, fill #0
  Filled 2022-12-14: qty 90, 90d supply, fill #1
  Filled 2023-04-02: qty 90, 90d supply, fill #2
  Filled 2023-06-16: qty 90, 90d supply, fill #3

## 2022-06-23 ENCOUNTER — Telehealth: Payer: Self-pay | Admitting: Hematology and Oncology

## 2022-06-23 ENCOUNTER — Other Ambulatory Visit (HOSPITAL_COMMUNITY): Payer: Self-pay

## 2022-06-23 NOTE — Telephone Encounter (Signed)
Scheduled appointment per 1/3 los. Left voicemail.

## 2022-06-26 ENCOUNTER — Other Ambulatory Visit (HOSPITAL_COMMUNITY): Payer: Self-pay

## 2022-07-10 ENCOUNTER — Other Ambulatory Visit (HOSPITAL_COMMUNITY): Payer: Self-pay

## 2022-07-10 MED ORDER — FLUCONAZOLE 150 MG PO TABS
ORAL_TABLET | ORAL | 0 refills | Status: DC
  Filled 2022-07-10: qty 2, 3d supply, fill #0

## 2022-07-14 ENCOUNTER — Telehealth: Payer: Self-pay | Admitting: Gastroenterology

## 2022-07-14 NOTE — Telephone Encounter (Signed)
Patient is calling states she is having a lot of build up of gas in her stomach and it's very painful doesn't know what to do. Please advise

## 2022-07-14 NOTE — Telephone Encounter (Signed)
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  She woke up today and has the trapped gas again. She is taking gas ex and will try warm liquids, walking and moving her bowels.  She was offered an appt but declined and states she will call back to set up.  She also mentions that she started eating meat over the holidays and this is new for her. She will go back to her normal diet.  She has been advised to call if the pain worsens or does not resolve.  She will also call to make an appt.  She also states that she stopped cirtucel and will resume that once she feels better.

## 2022-07-20 ENCOUNTER — Encounter: Payer: Self-pay | Admitting: Genetic Counselor

## 2022-07-20 ENCOUNTER — Other Ambulatory Visit (HOSPITAL_COMMUNITY): Payer: Self-pay

## 2022-07-20 DIAGNOSIS — R1084 Generalized abdominal pain: Secondary | ICD-10-CM | POA: Diagnosis not present

## 2022-07-20 DIAGNOSIS — R8271 Bacteriuria: Secondary | ICD-10-CM | POA: Diagnosis not present

## 2022-07-20 DIAGNOSIS — N201 Calculus of ureter: Secondary | ICD-10-CM | POA: Diagnosis not present

## 2022-07-20 MED ORDER — ONDANSETRON 8 MG PO TBDP
ORAL_TABLET | ORAL | 1 refills | Status: AC
  Filled 2022-07-20: qty 30, 7d supply, fill #0
  Filled 2022-09-04: qty 30, 7d supply, fill #1

## 2022-07-20 MED ORDER — OXYCODONE-ACETAMINOPHEN 5-325 MG PO TABS
ORAL_TABLET | ORAL | 0 refills | Status: AC
  Filled 2022-07-20: qty 20, 5d supply, fill #0

## 2022-07-20 MED ORDER — KETOROLAC TROMETHAMINE 10 MG PO TABS
ORAL_TABLET | ORAL | 0 refills | Status: DC
  Filled 2022-07-20: qty 20, 5d supply, fill #0

## 2022-07-20 MED ORDER — TAMSULOSIN HCL 0.4 MG PO CAPS
ORAL_CAPSULE | ORAL | 0 refills | Status: DC
  Filled 2022-07-20: qty 20, 20d supply, fill #0

## 2022-07-21 ENCOUNTER — Other Ambulatory Visit (HOSPITAL_COMMUNITY): Payer: Self-pay

## 2022-07-21 MED ORDER — AMPHETAMINE-DEXTROAMPHETAMINE 20 MG PO TABS
20.0000 mg | ORAL_TABLET | Freq: Three times a day (TID) | ORAL | 0 refills | Status: DC
  Filled 2022-07-26: qty 270, 90d supply, fill #0

## 2022-07-21 MED ORDER — ALPRAZOLAM 1 MG PO TABS
1.0000 mg | ORAL_TABLET | Freq: Three times a day (TID) | ORAL | 0 refills | Status: DC
  Filled 2022-07-25: qty 270, 90d supply, fill #0

## 2022-07-24 ENCOUNTER — Other Ambulatory Visit (HOSPITAL_COMMUNITY): Payer: Self-pay

## 2022-07-25 ENCOUNTER — Other Ambulatory Visit (HOSPITAL_COMMUNITY): Payer: Self-pay

## 2022-07-26 ENCOUNTER — Encounter: Payer: Self-pay | Admitting: Plastic Surgery

## 2022-07-26 ENCOUNTER — Ambulatory Visit (INDEPENDENT_AMBULATORY_CARE_PROVIDER_SITE_OTHER): Payer: Self-pay | Admitting: Plastic Surgery

## 2022-07-26 ENCOUNTER — Other Ambulatory Visit (HOSPITAL_COMMUNITY): Payer: Self-pay

## 2022-07-26 VITALS — BP 151/96 | HR 68 | Ht 69.0 in | Wt 196.2 lb

## 2022-07-26 DIAGNOSIS — Z719 Counseling, unspecified: Secondary | ICD-10-CM

## 2022-07-26 DIAGNOSIS — S01312A Laceration without foreign body of left ear, initial encounter: Secondary | ICD-10-CM

## 2022-07-26 NOTE — Progress Notes (Signed)
Misty [name] is a 47 year old employee of [employer] who presents today with a torn left earlobe where an earring has pulled through.  She is requesting repair.  Healthy only endorsing breast cancer which has been treated and she is on tamoxifen at this time.  She does not take any type of blood thinners.  We discussed repair of the tear including excision of the skin edges and reapproximation with sutures.  Will schedule for repair in the office.

## 2022-07-27 ENCOUNTER — Other Ambulatory Visit (HOSPITAL_COMMUNITY): Payer: Self-pay

## 2022-07-28 ENCOUNTER — Other Ambulatory Visit (HOSPITAL_COMMUNITY): Payer: Self-pay

## 2022-07-28 MED ORDER — ALPRAZOLAM 1 MG PO TABS
1.0000 mg | ORAL_TABLET | Freq: Three times a day (TID) | ORAL | 0 refills | Status: DC
  Filled 2022-07-28: qty 270, 90d supply, fill #0

## 2022-07-28 MED ORDER — AMPHETAMINE-DEXTROAMPHETAMINE 20 MG PO TABS
20.0000 mg | ORAL_TABLET | Freq: Three times a day (TID) | ORAL | 0 refills | Status: DC
  Filled 2022-07-28: qty 270, 90d supply, fill #0

## 2022-08-01 ENCOUNTER — Other Ambulatory Visit: Payer: Self-pay | Admitting: Urology

## 2022-08-02 ENCOUNTER — Other Ambulatory Visit: Payer: Self-pay | Admitting: Urology

## 2022-08-08 ENCOUNTER — Encounter (HOSPITAL_BASED_OUTPATIENT_CLINIC_OR_DEPARTMENT_OTHER): Payer: Self-pay | Admitting: Urology

## 2022-08-08 ENCOUNTER — Other Ambulatory Visit: Payer: Self-pay

## 2022-08-08 NOTE — Progress Notes (Addendum)
Spoke w/ via phone for pre-op interview---[facility] needs dos----ISTAT, urine pregnancy               Lab results------07/28/21 EKG in chart & Epic COVID test -----patient states asymptomatic no test needed Arrive at -------0530 on Friday, 08/11/22 NPO after MN NO Solid Food.  Clear liquids from MN until---0430 Med rec completed Medications to take morning of surgery -----Xanax prn, Prilosec, Zofran prn, Percocet prn, Imitrex prn, Tamoxifen / Do not take Adderall the morning of surgery. Diabetic medication -----none Patient instructed no nail polish to be worn day of surgery Patient instructed to bring photo id and insurance card day of surgery Patient aware to have Driver (ride ) / caregiver    for 24 hours after surgery - mom, Delaine and daughter, D'Erica Patient Special Instructions -----Patient was instructed that gel nail polish needed to be removed prior to surgery. Patient stated that she could not take off nail polish due to having to work 12 hour shifts and needing to get it removed at a salon. I advised patient that if she was not able to remove nail polish, it would be up to anesthesia whether her surgery would be done. Pre-Op special Istructions -----Patient wears upper partial. She is aware that it needs to be removed prior to going to the OR. Patient verbalized understanding of instructions that were given at this phone interview. Patient denies shortness of breath, chest pain, fever, cough at this phone interview.

## 2022-08-11 ENCOUNTER — Ambulatory Visit (HOSPITAL_BASED_OUTPATIENT_CLINIC_OR_DEPARTMENT_OTHER): Payer: Commercial Managed Care - PPO | Admitting: Anesthesiology

## 2022-08-11 ENCOUNTER — Ambulatory Visit (HOSPITAL_BASED_OUTPATIENT_CLINIC_OR_DEPARTMENT_OTHER)
Admission: RE | Admit: 2022-08-11 | Discharge: 2022-08-11 | Disposition: A | Discharge status: Home / Self Care | Payer: Commercial Managed Care - PPO | Attending: Urology | Admitting: Urology

## 2022-08-11 ENCOUNTER — Other Ambulatory Visit: Payer: Self-pay

## 2022-08-11 ENCOUNTER — Other Ambulatory Visit (HOSPITAL_COMMUNITY): Payer: Self-pay

## 2022-08-11 ENCOUNTER — Encounter (HOSPITAL_BASED_OUTPATIENT_CLINIC_OR_DEPARTMENT_OTHER): Payer: Self-pay | Admitting: Urology

## 2022-08-11 ENCOUNTER — Encounter (HOSPITAL_BASED_OUTPATIENT_CLINIC_OR_DEPARTMENT_OTHER)
Admission: RE | Disposition: A | Discharge status: Home / Self Care | Payer: Self-pay | Source: Home / Self Care | Attending: Urology

## 2022-08-11 DIAGNOSIS — N201 Calculus of ureter: Secondary | ICD-10-CM | POA: Diagnosis not present

## 2022-08-11 DIAGNOSIS — N202 Calculus of kidney with calculus of ureter: Secondary | ICD-10-CM | POA: Insufficient documentation

## 2022-08-11 DIAGNOSIS — Z87442 Personal history of urinary calculi: Secondary | ICD-10-CM

## 2022-08-11 DIAGNOSIS — Z01818 Encounter for other preprocedural examination: Secondary | ICD-10-CM

## 2022-08-11 DIAGNOSIS — N211 Calculus in urethra: Secondary | ICD-10-CM | POA: Diagnosis not present

## 2022-08-11 HISTORY — DX: Headache, unspecified: R51.9

## 2022-08-11 HISTORY — PX: CYSTOSCOPY/URETEROSCOPY/HOLMIUM LASER/STENT PLACEMENT: SHX6546

## 2022-08-11 LAB — POCT I-STAT, CHEM 8
BUN: 10 mg/dL (ref 6–20)
Calcium, Ion: 1.38 mmol/L (ref 1.15–1.40)
Chloride: 105 mmol/L (ref 98–111)
Creatinine, Ser: 0.9 mg/dL (ref 0.44–1.00)
Glucose, Bld: 106 mg/dL — ABNORMAL HIGH (ref 70–99)
HCT: 31 % — ABNORMAL LOW (ref 36.0–46.0)
Hemoglobin: 10.5 g/dL — ABNORMAL LOW (ref 12.0–15.0)
Potassium: 3.3 mmol/L — ABNORMAL LOW (ref 3.5–5.1)
Sodium: 139 mmol/L (ref 135–145)
TCO2: 25 mmol/L (ref 22–32)

## 2022-08-11 LAB — POCT PREGNANCY, URINE: Preg Test, Ur: NEGATIVE

## 2022-08-11 SURGERY — CYSTOSCOPY/URETEROSCOPY/HOLMIUM LASER/STENT PLACEMENT
Anesthesia: General | Site: Ureter | Laterality: Left

## 2022-08-11 MED ORDER — CIPROFLOXACIN IN D5W 400 MG/200ML IV SOLN
INTRAVENOUS | Status: AC
  Filled 2022-08-11: qty 200

## 2022-08-11 MED ORDER — CIPROFLOXACIN IN D5W 400 MG/200ML IV SOLN
400.0000 mg | Freq: Two times a day (BID) | INTRAVENOUS | Status: DC
  Administered 2022-08-11: 400 mg via INTRAVENOUS

## 2022-08-11 MED ORDER — OXYCODONE HCL 5 MG/5ML PO SOLN: 5.0000 mg | Freq: Once | ORAL | Status: DC | PRN

## 2022-08-11 MED ORDER — MEPERIDINE HCL 25 MG/ML IJ SOLN: 6.2500 mg | INTRAMUSCULAR | Status: DC | PRN

## 2022-08-11 MED ORDER — LIDOCAINE 2% (20 MG/ML) 5 ML SYRINGE
INTRAMUSCULAR | Status: DC | PRN
  Administered 2022-08-11: 60 mg via INTRAVENOUS

## 2022-08-11 MED ORDER — CIPROFLOXACIN HCL 500 MG PO TABS
500.0000 mg | ORAL_TABLET | Freq: Once | ORAL | 0 refills | Status: AC
  Filled 2022-08-11: qty 1, 1d supply, fill #0

## 2022-08-11 MED ORDER — DEXAMETHASONE SODIUM PHOSPHATE 10 MG/ML IJ SOLN
INTRAMUSCULAR | Status: AC
  Filled 2022-08-11: qty 1

## 2022-08-11 MED ORDER — ONDANSETRON HCL 4 MG/2ML IJ SOLN: 4.0000 mg | Freq: Once | INTRAMUSCULAR | Status: DC | PRN

## 2022-08-11 MED ORDER — FENTANYL CITRATE (PF) 100 MCG/2ML IJ SOLN: 25.0000 ug | INTRAMUSCULAR | Status: DC | PRN

## 2022-08-11 MED ORDER — SODIUM CHLORIDE 0.9 % IR SOLN
Status: DC | PRN
  Administered 2022-08-11: 3000 mL

## 2022-08-11 MED ORDER — DEXMEDETOMIDINE HCL IN NACL 80 MCG/20ML IV SOLN
INTRAVENOUS | Status: AC
  Filled 2022-08-11: qty 20

## 2022-08-11 MED ORDER — LIDOCAINE HCL (PF) 2 % IJ SOLN
INTRAMUSCULAR | Status: AC
  Filled 2022-08-11: qty 5

## 2022-08-11 MED ORDER — PHENYLEPHRINE 80 MCG/ML (10ML) SYRINGE FOR IV PUSH (FOR BLOOD PRESSURE SUPPORT)
PREFILLED_SYRINGE | INTRAVENOUS | Status: DC | PRN
  Administered 2022-08-11 (×2): 80 ug via INTRAVENOUS

## 2022-08-11 MED ORDER — ONDANSETRON HCL 4 MG/2ML IJ SOLN
INTRAMUSCULAR | Status: DC | PRN
  Administered 2022-08-11: 4 mg via INTRAVENOUS

## 2022-08-11 MED ORDER — PROPOFOL 10 MG/ML IV BOLUS
INTRAVENOUS | Status: AC
  Filled 2022-08-11: qty 20

## 2022-08-11 MED ORDER — PHENYLEPHRINE 80 MCG/ML (10ML) SYRINGE FOR IV PUSH (FOR BLOOD PRESSURE SUPPORT)
PREFILLED_SYRINGE | INTRAVENOUS | Status: AC
  Filled 2022-08-11: qty 10

## 2022-08-11 MED ORDER — CLINDAMYCIN PHOSPHATE 900 MG/50ML IV SOLN
900.0000 mg | Freq: Once | INTRAVENOUS | Status: AC
  Administered 2022-08-11: 900 mg via INTRAVENOUS

## 2022-08-11 MED ORDER — AMISULPRIDE (ANTIEMETIC) 5 MG/2ML IV SOLN: 10.0000 mg | Freq: Once | INTRAVENOUS | Status: DC | PRN

## 2022-08-11 MED ORDER — ACETAMINOPHEN 500 MG PO TABS
ORAL_TABLET | ORAL | Status: AC
  Filled 2022-08-11: qty 2

## 2022-08-11 MED ORDER — ACETAMINOPHEN 500 MG PO TABS
1000.0000 mg | ORAL_TABLET | Freq: Once | ORAL | Status: AC
  Administered 2022-08-11: 1000 mg via ORAL

## 2022-08-11 MED ORDER — MIDAZOLAM HCL 2 MG/2ML IJ SOLN
INTRAMUSCULAR | Status: AC
  Filled 2022-08-11: qty 2

## 2022-08-11 MED ORDER — KETOROLAC TROMETHAMINE 30 MG/ML IJ SOLN
INTRAMUSCULAR | Status: DC | PRN
  Administered 2022-08-11: 15 mg via INTRAVENOUS

## 2022-08-11 MED ORDER — OXYCODONE HCL 5 MG PO TABS: 5.0000 mg | ORAL_TABLET | Freq: Once | ORAL | Status: DC | PRN

## 2022-08-11 MED ORDER — DEXMEDETOMIDINE HCL IN NACL 200 MCG/50ML IV SOLN
INTRAVENOUS | Status: DC | PRN
  Administered 2022-08-11: 12 ug via INTRAVENOUS

## 2022-08-11 MED ORDER — MIDAZOLAM HCL 5 MG/5ML IJ SOLN
INTRAMUSCULAR | Status: DC | PRN
  Administered 2022-08-11: 2 mg via INTRAVENOUS

## 2022-08-11 MED ORDER — PHENAZOPYRIDINE HCL 200 MG PO TABS
200.0000 mg | ORAL_TABLET | Freq: Three times a day (TID) | ORAL | 0 refills | Status: DC | PRN
  Filled 2022-08-11: qty 10, 4d supply, fill #0

## 2022-08-11 MED ORDER — PROPOFOL 10 MG/ML IV BOLUS
INTRAVENOUS | Status: DC | PRN
  Administered 2022-08-11: 200 mg via INTRAVENOUS

## 2022-08-11 MED ORDER — FENTANYL CITRATE (PF) 100 MCG/2ML IJ SOLN
INTRAMUSCULAR | Status: AC
  Filled 2022-08-11: qty 2

## 2022-08-11 MED ORDER — FENTANYL CITRATE (PF) 100 MCG/2ML IJ SOLN
INTRAMUSCULAR | Status: DC | PRN
  Administered 2022-08-11 (×2): 50 ug via INTRAVENOUS

## 2022-08-11 MED ORDER — KETOROLAC TROMETHAMINE 30 MG/ML IJ SOLN: 30.0000 mg | Freq: Once | INTRAMUSCULAR | Status: DC | PRN

## 2022-08-11 MED ORDER — KETOROLAC TROMETHAMINE 30 MG/ML IJ SOLN
INTRAMUSCULAR | Status: AC
  Filled 2022-08-11: qty 1

## 2022-08-11 MED ORDER — LACTATED RINGERS IV SOLN: INTRAVENOUS | Status: DC

## 2022-08-11 MED ORDER — IOHEXOL 300 MG/ML  SOLN
INTRAMUSCULAR | Status: DC | PRN
  Administered 2022-08-11: 8 mL via URETHRAL

## 2022-08-11 MED ORDER — DEXAMETHASONE SODIUM PHOSPHATE 10 MG/ML IJ SOLN
INTRAMUSCULAR | Status: DC | PRN
  Administered 2022-08-11: 10 mg via INTRAVENOUS

## 2022-08-11 MED ORDER — CLINDAMYCIN PHOSPHATE 900 MG/50ML IV SOLN
INTRAVENOUS | Status: AC
  Filled 2022-08-11: qty 50

## 2022-08-11 MED ORDER — 0.9 % SODIUM CHLORIDE (POUR BTL) OPTIME
TOPICAL | Status: DC | PRN
  Administered 2022-08-11: 500 mL

## 2022-08-11 MED ORDER — ONDANSETRON HCL 4 MG/2ML IJ SOLN
INTRAMUSCULAR | Status: AC
  Filled 2022-08-11: qty 2

## 2022-08-11 SURGICAL SUPPLY — 25 items
BAG DRAIN URO-CYSTO SKYTR STRL (DRAIN) ×1 IMPLANT
BAG DRN UROCATH (DRAIN) ×1
BASKET STONE 1.7 NGAGE (UROLOGICAL SUPPLIES) IMPLANT
CATH URETL OPEN 5X70 (CATHETERS) ×1 IMPLANT
CLOTH BEACON ORANGE TIMEOUT ST (SAFETY) ×1 IMPLANT
COVER DOME SNAP 22 D (MISCELLANEOUS) IMPLANT
GLOVE BIOGEL PI IND STRL 6.5 (GLOVE) IMPLANT
GLOVE SURG SS PI 6.5 STRL IVOR (GLOVE) IMPLANT
GLOVE SURG SS PI 7.5 STRL IVOR (GLOVE) IMPLANT
GOWN STRL REUS W/ TWL LRG LVL3 (GOWN DISPOSABLE) IMPLANT
GOWN STRL REUS W/TWL LRG LVL3 (GOWN DISPOSABLE) ×1
GOWN STRL REUS W/TWL XL LVL3 (GOWN DISPOSABLE) ×1 IMPLANT
GUIDEWIRE STR DUAL SENSOR (WIRE) ×1 IMPLANT
IV NS IRRIG 3000ML ARTHROMATIC (IV SOLUTION) ×2 IMPLANT
KIT TURNOVER CYSTO (KITS) ×1 IMPLANT
MANIFOLD NEPTUNE II (INSTRUMENTS) ×1 IMPLANT
NS IRRIG 500ML POUR BTL (IV SOLUTION) ×1 IMPLANT
PACK CYSTO (CUSTOM PROCEDURE TRAY) ×1 IMPLANT
SHEATH NAVIGATOR HD 12/14X36 (SHEATH) IMPLANT
SLEEVE SCD COMPRESS KNEE MED (STOCKING) ×1 IMPLANT
STENT URET 6FRX26 CONTOUR (STENTS) IMPLANT
TRACTIP FLEXIVA PULS ID 200XHI (Laser) IMPLANT
TRACTIP FLEXIVA PULSE ID 200 (Laser) ×1
TUBE CONNECTING 12X1/4 (SUCTIONS) IMPLANT
TUBING UROLOGY SET (TUBING) ×1 IMPLANT

## 2022-08-11 NOTE — Discharge Instructions (Addendum)
DISCHARGE INSTRUCTIONS FOR KIDNEY STONE/URETERAL STENT   MEDICATIONS:  1. Resume all your other meds from home - except do not take any extra narcotic pain meds that you may have at home.  2. Pyridium is to help with the burning/stinging when you urinate. 3. Take Oxycodone, otherwise taking upto 1000 mg every 6 hours of plainTylenol will help treat your pain.   4. Take Cipro one hour prior to removal of your stent.   ACTIVITY:  1. No strenuous activity x 1week  2. No driving while on narcotic pain medications  3. Drink plenty of water  4. Continue to walk at home - you can still get blood clots when you are at home, so keep active, but don't over do it.  5. May return to work/school tomorrow or when you feel ready   BATHING:  1. You can shower and we recommend daily showers  2. You have a string coming from your urethra: The stent string is attached to your ureteral stent. Do not pull on this.   SIGNS/SYMPTOMS TO CALL:  Please call us if you have a fever greater than 101.5, uncontrolled nausea/vomiting, uncontrolled pain, dizziness, unable to urinate, bloody urine, chest pain, shortness of breath, leg swelling, leg pain, redness around wound, drainage from wound, or any other concerns or questions.   You can reach [place] at [phone].   FOLLOW-UP:  1. You have an appointment in 6 weeks with a ultrasound of your kidneys prior.   2. You have a string attached to your stent, you may remove it on Wednesday, Feb 28th. To do this, pull the strings until the stents are completely removed. You may feel an odd sensation in your back.    Post Anesthesia Home Care Instructions  Activity: Get plenty of rest for the remainder of the day. A responsible individual must stay with you for 24 hours following the procedure.  For the next 24 hours, DO NOT: -Drive a car -Paediatric nurse -Drink alcoholic beverages -Take any medication unless instructed by your physician -Make any legal decisions or  sign important papers.  Meals: Start with liquid foods such as gelatin or soup. Progress to regular foods as tolerated. Avoid greasy, spicy, heavy foods. If nausea and/or vomiting occur, drink only clear liquids until the nausea and/or vomiting subsides. Call your physician if vomiting continues.  Special Instructions/Symptoms: Your throat may feel dry or sore from the anesthesia or the breathing tube placed in your throat during surgery. If this causes discomfort, gargle with warm salt water. The discomfort should disappear within 24 hours.  May take Tylenol beginning at 75 Noon as needed for soreness/discomfort.[facility] [phone] Post Ureteroscopy With Stent Instructions  Definitions:  Ureter: The duct that transports urine from the kidney to the bladder. Stent:   A plastic hollow tube that is placed into the ureter, from the kidney to the bladder to prevent the ureter from swelling shut.  GENERAL INSTRUCTIONS:  Despite the fact that no skin incisions were used, the area around the ureter and bladder is raw and irritated. The stent is a foreign body which will further irritate the bladder wall. This irritation is manifested by increased frequency of urination, both day and night, and by an increase in the urge to urinate. In some, the urge to urinate is present almost always. Sometimes the urge is strong enough that you may not be able to stop yourself from urinating. The only real cure is to remove the stent and then give  time for the bladder wall to heal which can't be done until the danger of the ureter swelling shut has passed, which varies.  You may see some blood in your urine while the stent is in place and a few days afterwards. Do not be alarmed, even if the urine was clear for a while. Get off your feet and drink lots of fluids until clearing occurs. If you start to pass clots or don't improve, call us.  DIET: You may return to your normal diet immediately.  Because of the raw surface of your bladder, alcohol, spicy foods, acid type foods and drinks with caffeine may cause irritation or frequency and should be used in moderation. To keep your urine flowing freely and to avoid constipation, drink plenty of fluids during the day ( 8-10 glasses ). Tip: Avoid cranberry juice because it is very acidic.  ACTIVITY: Your physical activity doesn't need to be restricted. However, if you are very active, you may see some blood in your urine. We suggest that you reduce your activity under these circumstances until the bleeding has stopped.  BOWELS: It is important to keep your bowels regular during the postoperative period. Straining with bowel movements can cause bleeding. A bowel movement every other day is reasonable. Use a mild laxative if needed, such as Milk of Magnesia 2-3 tablespoons, or 2 Dulcolax tablets. Call if you continue to have problems. If you have been taking narcotics for pain, before, during or after your surgery, you may be constipated. Take a laxative if necessary.   MEDICATION: You should resume your pre-surgery medications unless told not to. In addition you will often be given an antibiotic to prevent infection. These should be taken as prescribed until the bottles are finished unless you are having an unusual reaction to one of the drugs.  PROBLEMS YOU SHOULD REPORT TO [place]: Fevers over 100.5 Fahrenheit. Heavy bleeding, or clots ( See above notes about blood in urine ). Inability to urinate. Drug reactions ( hives, rash, nausea, vomiting, diarrhea ). Severe burning or pain with urination that is not improving.  FOLLOW-UP: You will need a follow-up appointment to monitor your progress. Call for this appointment at the number listed above. Usually the first appointment will be about three to fourteen days after your surgery.

## 2022-08-11 NOTE — Transfer of Care (Signed)
Immediate Anesthesia Transfer of Care Note  Patient: Misty [name]  Procedure(s) Performed: CYSTOSCOPY/LEFT URETEROSCOPY/HOLMIUM LASER/LEFT RETROGRADE PYELOGRAM/LEFT STENT PLACEMENT (Left: Ureter)  Patient Location: PACU  Anesthesia Type:General  Level of Consciousness: awake, alert , oriented, and patient cooperative  Airway & Oxygen Therapy: Patient Spontanous Breathing  Post-op Assessment: Report given to RN and Post -op Vital signs reviewed and stable  Post vital signs: Reviewed and stable  Last Vitals:  Vitals Value Taken Time  BP 133/96 08/11/22 0833  Temp 36.4 C 08/11/22 0833  Pulse 95 08/11/22 0835  Resp 14 08/11/22 0835  SpO2 95 % 08/11/22 0835  Vitals shown include unvalidated device data.  Last Pain:  Vitals:   08/11/22 0605  TempSrc: Oral         Complications: No notable events documented.

## 2022-08-11 NOTE — H&P (Signed)
Misty [name] presents today for right flank discomfort after removing her stent earlier today. She is status post right ureteroscopy with Dr. Cari Caraway on March 10. She states that she had some persistent flank discomfort with a stent in place but it seemed to worsen after removing her stent today. She has no other acute complaints here in clinic.   09/09/21: She presents today for a follow up after undergoing right sided ureteroscopy. she is doing well and has no complaints today. She has not had any gross blood in her urine or passed any more stone fragments.   12/09/2021: Misty [name] is a pleasant 47 year old female who presents today for a follow-up renal ultrasound. She has not had any significant flank pain but does endorse some lower back discomfort. She is currently on Flagyl for suspected BV. She is taking a probiotic. She does endorse some urgency of urination and some vaginal discomfort. She denies fevers, chills.   07/20/2022: 47 year old female who presents today for concerns of urinary urgency and frequency associated with right flank pain and discomfort and intermittent nausea. She states that this began 1 week ago and is worsening. She denies gross hematuria, dysuria, fevers, chills. She does have a history of stones and feels that she may be passing one.     ALLERGIES: Dilaudid TABS Latex Macrobid CAPS Rocephin SOLR    MEDICATIONS: Oxybutynin Chloride 5 mg tablet 1 tablet PO Q 8 H PRN  Adderall  Oxycodone Hcl 5 mg tablet 1 tablet PO Q 8 H as needed severe pain  Singulair  Tamoxifen Citrate 10 mg tablet  Xanax TABS Oral     GU PSH: Cysto Bladder Stone <2.5cm - 2011 Cysto Uretero Lithotripsy - 2014 Cystoscopy Insert Stent - 2014, 2014, 2011, 2011 Cystoscopy Ureteroscopy - 2011 ESWL - 2011 Ureteroscopic laser litho - 08/26/2021       PSH Notes: Cystoscopy With Ureteroscopy With Lithotripsy, Cystoscopy With Insertion Of Ureteral Stent Right, Cystoscopy With Insertion Of Ureteral  Stent Right, Cystoscopy With Insertion Of Ureteral Stent Right, Cystoscopy With Fragmentation Of Bladder Calculus, Cystoscopy With Insertion Of Ureteral Stent Right, Lithotripsy, Cystoscopy With Ureteroscopy   NON-GU PSH: No Non-GU PSH    GU PMH: Ureteral calculus - 12/09/2021, - 09/09/2021, - 08/29/2021, - 08/12/2021, Calculus of right ureter, - 2014 Flank Pain - 08/29/2021 Renal calculus, Right renal and ureteral calculi. Stented for concurrent infection. She is having significant stent issues. She may have passed the right ureteral stone. - 08/12/2021, Kidney stone on right side, - 2014 Renal cyst, Complex left renal cyst needs further investigation/characterization - 08/12/2021 Bladder Stone, Bladder calculus - 2014 History of urolithiasis, Nephrolithiasis - 2014 Urinary Tract Inf, Unspec site, Pyuria - 2014      PMH Notes:  2009-07-29 16:44:41 - Note: Flank Pain Right   NON-GU PMH: Anxiety, Anxiety (Symptom) - 2014 Breast Cancer, History Encounter for general adult medical examination without abnormal findings, Encounter for preventive health examination    FAMILY HISTORY: Death In The Family Father - Runs In Family Family Health Status - Mother's Age - Runs In Family Family Health Status Number - Runs In Family   SOCIAL HISTORY: No Social History     Notes: Never A Smoker, Occupation:, Marital History - Currently Married, Alcohol Use, Caffeine Use   REVIEW OF SYSTEMS:    GU Review Female:   Patient reports frequent urination, get up at night to urinate, and leakage of urine. Patient denies hard to postpone urination, burning /pain with urination, stream starts and stops,  trouble starting your stream, have to strain to urinate, and being pregnant.  Gastrointestinal (Upper):   Patient denies nausea, vomiting, and indigestion/ heartburn.  Gastrointestinal (Lower):   Patient denies diarrhea and constipation.  Constitutional:   Patient denies fever, night sweats, weight loss, and fatigue.   Skin:   Patient denies skin rash/ lesion and itching.  Eyes:   Patient denies blurred vision and double vision.  Hematologic/Lymphatic:   Patient denies swollen glands and easy bruising.  Musculoskeletal:   Patient reports back pain. Patient denies joint pain.  Neurological:   Patient denies headaches and dizziness.  Psychologic:   Patient denies depression and anxiety.   Notes: Bladder pressure  Nocturia x3-4    VITAL SIGNS:      07/20/2022 03:33 PM  BP 147/90 mmHg  Pulse 70 /min   MULTI-SYSTEM PHYSICAL EXAMINATION:    Constitutional: Well-nourished. No physical deformities. Normally developed. Good grooming.  Respiratory: No labored breathing, no use of accessory muscles.   Cardiovascular: Normal temperature, normal extremity pulses, no swelling, no varicosities.  Skin: No paleness, no jaundice, no cyanosis. No lesion, no ulcer, no rash.  Neurologic / Psychiatric: Oriented to time, oriented to place, oriented to person. No depression, no anxiety, no agitation.  Gastrointestinal: No mass, no tenderness, no rigidity, non obese abdomen.     Complexity of Data:  Source Of History:  Patient  Records Review:   Previous Doctor Records, Previous Patient Records  Urine Test Review:   Urinalysis  X-Ray Review: C.T. Abdomen/Pelvis: Reviewed Films. Reviewed Report. Discussed With Patient.     07/20/22 07/20/22  Urinalysis  Urine Appearance Clear  Clear   Urine Color Yellow  Yellow   Urine Glucose Neg  Neg mg/dL  Urine Bilirubin Neg  Neg mg/dL  Urine Ketones Neg  Neg mg/dL  Urine Specific Gravity 1.015  1.015   Urine Blood Neg  Neg ery/uL  Urine pH <=5.0  <=5.0   Urine Protein Neg  Neg mg/dL  Urine Urobilinogen 0.2  0.2 mg/dL  Urine Nitrites Neg  Neg   Urine Leukocyte Esterase Neg  Neg leu/uL   PROCEDURES:         C.T. Urogram - [id number]      Patient confirmed No Neulasta OnPro Device.          KUB - [id number]  A single view of the abdomen is obtained.      Patient confirmed  No Neulasta OnPro Device.          PVR Ultrasound - [id number]  Scanned Volume: 170 cc         Urinalysis - 81003 Dipstick Dipstick Cont'd  Color: Yellow Bilirubin: Neg  Appearance: Clear Ketones: Neg  Specific Gravity: 1.015 Blood: Neg  pH: <=5.0 Protein: Neg  Glucose: Neg Urobilinogen: 0.2    Nitrites: Neg    Leukocyte Esterase: Neg    Notes:            Ketoralac '60mg'$  - [id number], [id number] The area was prepped and cleaned using sterile technique. A band aid was applied. The pt tolerated well.  0 wasted   Qty: 60 Adm. By: Providence Lanius  Unit: mg Lot No [id number]  Route: SQ Exp. Date 08/18/2023  Freq: None Mfgr.:   Site: Right Buttock   ASSESSMENT:      ICD-10 Details  1 GU:   Flank Pain - R10.84 Right, Acute, Uncomplicated - Right flank painin the setting of a LEFT OBSTRUCTING stone.   2   Ureteral calculus - N20.1  Left, Acute, Uncomplicated   PLAN:            Medications New Meds: Ketorolac Tromethamine 10 mg tablet 1 tablet PO Q 6 H PRN for pain  #20  0 Refill(s)  Tamsulosin Hcl 0.4 mg capsule 1 capsule PO Q HS   #20  0 Refill(s)  Ondansetron Odt 8 mg tablet,disintegrating 1 tablet PO Q 6 H PRN for nausea  #30  1 Refill(s)  Oxycodone-Acetaminophen 5 mg-325 mg tablet 1 tablet PO Q 6 H PRN severe pain  #20  0 Refill(s)  Pharmacy Name:  [facility]  Address:  [id number], [place] [address]  Phone:  [phone]  Fax:  [phone]    Stop Meds: Diflucan 150 mg tablet 1 tablet PO Q1WK  Start: 12/09/2021  Discontinue: 07/20/2022  - Reason: The medication cycle was completed.  Tramadol Hcl 50 mg tablet 1 tablet PO Every 4-6 hours as needed pain  Start: 08/12/2021  Discontinue: 07/20/2022  - Reason: The medication cycle was completed.            Orders Labs CULTURE, URINE  X-Rays: C.T. Stone Protocol Without I.V. Contrast - Right flank pain, Right lower abdominal pain, urinary frequency, urgency-HX of stones.   X-Ray Notes:  History:  Hematuria: Yes/No  Patient to see MD after exam: Yes/No  Previous exam: CT / IVP/ US/ KUB/ None  When:  Where:  Diabetic: Yes/ No  BUN/ Creatine:  Date of last BUN Creatinine:  Weight in pounds:  Allergy- Contrasts/ Shellfish: Yes/ No  Conflicting diabetic meds: Yes/ No  Oral contrast and instructions given to patient:   Prior Authorization #: [employer] [facility]: NPCR            Schedule         Document Letter(s):  Created for Patient: Clinical Summary         Notes:   Urine sent for precautionary culture today. Ct scan obtained. There is a LEFT sided obstructing distal ureteral stone and a potential RIGHT sided UVj/bladder stone not obstructing. Final radioloogy read is pending. She would like to pursue ureteroscopy. She was given '60mg'$  of IM ketoralac in office. She was also given oral medications for pain and nausea to use at home. Green sheet placed for URS today. Strict return precautions advised in the interval.

## 2022-08-11 NOTE — Op Note (Signed)
Preoperative diagnosis: left ureteral calculus  Postoperative diagnosis: left ureteral calculus  Procedure:  Cystoscopy left ureteroscopy and stone removal Ureteroscopic laser lithotripsy left 83F x 26 ureteral stent placement  left retrograde pyelography with interpretation  Surgeon: Ardis Hughs, MD  Anesthesia: General  Complications: None  Intraoperative findings:  #1 - left retrograde pyelography demonstrated a filling defect within the left ureter consistent with the patient's known calculus without other abnormalities. #2 - small non-obstructing stones/papillary tip calcifications in kidney which were also removed  EBL: Minimal  Specimens: left ureteral calculus  Indication: Misty [name] is a 47 y.o.   patient with a long history of nephrolithiasis who presents to our clinic with left-sided ureteral colic, CT scan demonstrated an obstructing left distal ureteral stone as well as some stones in the left kidney that were nonobstructing.  She also had some stones on her right there were within the renal pelvis and nonobstructing as well.  After reviewing the management options for treatment, the patient elected to proceed with the above surgical procedure(s). We have discussed the potential benefits and risks of the procedure, side effects of the proposed treatment, the likelihood of the patient achieving the goals of the procedure, and any potential problems that might occur during the procedure or recuperation. Informed consent has been obtained.   Description of procedure:  The patient was taken to the operating room and general anesthesia was induced.  The patient was placed in the dorsal lithotomy position, prepped and draped in the usual sterile fashion, and preoperative antibiotics were administered. A preoperative time-out was performed.   Cystourethroscopy was performed.  The patient's urethra was examined and was normal. The bladder was then systematically  examined in its entirety. There was no evidence for any bladder tumors, stones, or other mucosal pathology.    Attention then turned to the left ureteral orifice and a ureteral catheter was used to intubate the ureteral orifice.  Omnipaque contrast was injected through the ureteral catheter and a retrograde pyelogram was performed with findings as dictated above.  A 0.38 sensor guidewire was then advanced up the left ureter into the renal pelvis under fluoroscopic guidance. The 6 Fr semirigid ureteroscope was then advanced into the ureter next to the guidewire and the calculus was identified.   The stone was then fragmented with the 365 micron holmium laser fiber on a setting of 1.0 and frequency of 20 Hz.   All stones were then removed from the ureter with an N-gage nitinol basket.  Reinspection of the ureter revealed no remaining visible stones or fragments.   I then advanced the scope up to the renal pelvis noting no additional stone fragments.  I advanced a wire through the ureteroscope and remove the scope over the wire.  I then advanced a flexible ureteroscope over the wire and into the left renal pelvis.  I noted several small nonobstructing stones as well as some papillary tip calcifications.  Realizing that I would need to come in and out of the ER several times I advanced the wire again through the ureteroscope and removed the ureteroscope over the wire.  I then advanced a ureteral access sheath over the wire and into the proximal ureter under fluoroscopic guidance removing the inner portion of the sheath and the safety wire.  I then repassed the flexible ureteroscope and removed all the papillary tip calcifications that I was able to grab as well as a nonobstructing small stones.  These did not require laser fragmentation.  I  then slowly backed out the flexible ureteroscope and the ureteral access sheath simultaneously inspecting the ureter noting no significant ureteral trauma or additional  stone fragments.  The wire was then backloaded through the cystoscope and a ureteral stent was advance over the wire using Seldinger technique.  The stent was positioned appropriately under fluoroscopic and cystoscopic guidance.  The wire was then removed with an adequate stent curl noted in the renal pelvis as well as in the bladder.  The bladder was then emptied and the procedure ended.  The patient appeared to tolerate the procedure well and without complications.  The patient was able to be awakened and transferred to the recovery unit in satisfactory condition.   Disposition: The tether of the stent was left on and tucked inside the patient's vagina.  Instructions for removing the stent have been provided to the patient. The patient has been scheduled for followup in 6 weeks with a renal ultrasound.

## 2022-08-11 NOTE — Anesthesia Postprocedure Evaluation (Signed)
Anesthesia Post Note  Patient: NIOMA PRUSHA  Procedure(s) Performed: CYSTOSCOPY/LEFT URETEROSCOPY/HOLMIUM LASER/LEFT RETROGRADE PYELOGRAM/LEFT STENT PLACEMENT (Left: Ureter)     Patient location during evaluation: PACU Anesthesia Type: General Level of consciousness: awake and alert, oriented and patient cooperative Pain management: pain level controlled Vital Signs Assessment: post-procedure vital signs reviewed and stable Respiratory status: spontaneous breathing, nonlabored ventilation and respiratory function stable Cardiovascular status: blood pressure returned to baseline and stable Postop Assessment: no apparent nausea or vomiting Anesthetic complications: no   No notable events documented.  Last Vitals:  Vitals:   08/11/22 0845 08/11/22 0900  BP: (!) 132/95   Pulse: 86 77  Resp: 17 18  Temp:    SpO2: 94% 95%    Last Pain:  Vitals:   08/11/22 [id number]  TempSrc: Oral                 Pervis Hocking

## 2022-08-11 NOTE — Anesthesia Preprocedure Evaluation (Addendum)
Anesthesia Evaluation  Patient identified by MRN, date of birth, ID band Patient awake    Reviewed: Allergy & Precautions, H&P , NPO status , Patient's Chart, lab work & pertinent test results  Airway Mallampati: II  TM Distance: >3 FB Neck ROM: Full    Dental  (+) Teeth Intact, Dental Advisory Given, Missing,    Pulmonary asthma (well controlled)    Pulmonary exam normal breath sounds clear to auscultation       Cardiovascular negative cardio ROS Normal cardiovascular exam Rhythm:Regular Rate:Normal     Neuro/Psych  Headaches PSYCHIATRIC DISORDERS Anxiety        GI/Hepatic Neg liver ROS, hiatal hernia,GERD  Medicated and Controlled,,  Endo/Other  negative endocrine ROS    Renal/GU Renal disease (ureteral stone)  negative genitourinary   Musculoskeletal negative musculoskeletal ROS (+)    Abdominal   Peds negative pediatric ROS (+)  Hematology  (+) Blood dyscrasia, anemia Hb 10.5   Anesthesia Other Findings   Reproductive/Obstetrics negative OB ROS                             Anesthesia Physical Anesthesia Plan  ASA: 2  Anesthesia Plan: General   Post-op Pain Management: Tylenol PO (pre-op)* and Toradol IV (intra-op)*   Induction: Intravenous  PONV Risk Score and Plan: 4 or greater and Ondansetron, Dexamethasone, Midazolam and Treatment may vary due to age or medical condition  Airway Management Planned: LMA  Additional Equipment: None  Intra-op Plan:   Post-operative Plan: Extubation in OR  Informed Consent: I have reviewed the patients History and Physical, chart, labs and discussed the procedure including the risks, benefits and alternatives for the proposed anesthesia with the patient or authorized representative who has indicated his/her understanding and acceptance.     Dental advisory given  Plan Discussed with: CRNA  Anesthesia Plan Comments:         Anesthesia Quick Evaluation

## 2022-08-11 NOTE — Anesthesia Procedure Notes (Signed)
Procedure Name: LMA Insertion Date/Time: 08/11/2022 7:31 AM  Performed by: Rogers Blocker, CRNAPre-anesthesia Checklist: Patient identified, Emergency Drugs available, Suction available and Patient being monitored Patient Re-evaluated:Patient Re-evaluated prior to induction Oxygen Delivery Method: Circle System Utilized Preoxygenation: Pre-oxygenation with 100% oxygen Induction Type: IV induction Ventilation: Mask ventilation without difficulty LMA: LMA inserted LMA Size: 4.0 Number of attempts: 1 Airway Equipment and Method: Bite block Placement Confirmation: positive ETCO2 Tube secured with: Tape Dental Injury: Teeth and Oropharynx as per pre-operative assessment

## 2022-08-11 NOTE — Interval H&P Note (Signed)
History and Physical Interval Note:  08/11/2022 7:31 AM  Misty [name]  has presented today for surgery, with the diagnosis of LEFT DISTAL URETERAL STONE.  The various methods of treatment have been discussed with the patient and family. After consideration of risks, benefits and other options for treatment, the patient has consented to  Procedure(s) with comments: CYSTOSCOPY/LEFT URETEROSCOPY/HOLMIUM LASER/LEFT RETROGRADE PYELOGRAM/LEFT STENT PLACEMENT (Left) - 60 MINUTES NEEDED FOR CASE as a surgical intervention.  The patient's history has been reviewed, patient examined, no change in status, stable for surgery.  I have reviewed the patient's chart and labs.  Questions were answered to the patient's satisfaction.     Ardis Hughs

## 2022-08-14 ENCOUNTER — Encounter (HOSPITAL_BASED_OUTPATIENT_CLINIC_OR_DEPARTMENT_OTHER): Payer: Self-pay | Admitting: Urology

## 2022-08-14 ENCOUNTER — Other Ambulatory Visit (HOSPITAL_COMMUNITY): Payer: Self-pay

## 2022-08-14 MED ORDER — PHENAZOPYRIDINE HCL 200 MG PO TABS
200.0000 mg | ORAL_TABLET | Freq: Three times a day (TID) | ORAL | 1 refills | Status: DC
  Filled 2022-08-14: qty 30, 10d supply, fill #0

## 2022-08-14 MED ORDER — HYOSCYAMINE SULFATE 0.125 MG PO TBDP
ORAL_TABLET | ORAL | 0 refills | Status: DC
  Filled 2022-08-14: qty 10, 5d supply, fill #0

## 2022-08-17 ENCOUNTER — Other Ambulatory Visit (HOSPITAL_COMMUNITY): Payer: Self-pay

## 2022-08-17 MED ORDER — FLUCONAZOLE 200 MG PO TABS
200.0000 mg | ORAL_TABLET | ORAL | 0 refills | Status: DC
  Filled 2022-08-17: qty 2, 14d supply, fill #0

## 2022-08-18 ENCOUNTER — Other Ambulatory Visit (HOSPITAL_COMMUNITY): Payer: Self-pay

## 2022-08-18 LAB — CALCULI, WITH PHOTOGRAPH (CLINICAL LAB)
Calcium Oxalate Dihydrate: 70 %
Calcium Oxalate Monohydrate: 20 %
Hydroxyapatite: 10 %
Weight Calculi: 63 mg

## 2022-08-23 ENCOUNTER — Other Ambulatory Visit (HOSPITAL_COMMUNITY): Payer: Self-pay

## 2022-08-23 MED ORDER — FLUCONAZOLE 150 MG PO TABS
150.0000 mg | ORAL_TABLET | ORAL | 0 refills | Status: DC
  Filled 2022-08-23: qty 2, 3d supply, fill #0

## 2022-08-25 DIAGNOSIS — R8271 Bacteriuria: Secondary | ICD-10-CM | POA: Diagnosis not present

## 2022-08-30 ENCOUNTER — Ambulatory Visit: Payer: Commercial Managed Care - PPO | Admitting: Plastic Surgery

## 2022-09-04 ENCOUNTER — Other Ambulatory Visit (HOSPITAL_COMMUNITY): Payer: Self-pay

## 2022-09-06 ENCOUNTER — Ambulatory Visit: Payer: Commercial Managed Care - PPO | Admitting: Physician Assistant

## 2022-09-22 DIAGNOSIS — N200 Calculus of kidney: Secondary | ICD-10-CM | POA: Diagnosis not present

## 2022-09-26 ENCOUNTER — Encounter: Payer: Self-pay | Admitting: Adult Health

## 2022-09-26 ENCOUNTER — Other Ambulatory Visit (HOSPITAL_COMMUNITY): Payer: Self-pay | Admitting: Adult Health

## 2022-09-26 DIAGNOSIS — E213 Hyperparathyroidism, unspecified: Secondary | ICD-10-CM

## 2022-09-27 DIAGNOSIS — D259 Leiomyoma of uterus, unspecified: Secondary | ICD-10-CM | POA: Diagnosis not present

## 2022-10-04 DIAGNOSIS — N840 Polyp of corpus uteri: Secondary | ICD-10-CM | POA: Diagnosis not present

## 2022-10-04 DIAGNOSIS — D251 Intramural leiomyoma of uterus: Secondary | ICD-10-CM | POA: Diagnosis not present

## 2022-10-17 ENCOUNTER — Ambulatory Visit: Payer: 59 | Admitting: Hematology and Oncology

## 2022-10-20 ENCOUNTER — Encounter (HOSPITAL_COMMUNITY): Payer: Commercial Managed Care - PPO

## 2022-10-23 ENCOUNTER — Other Ambulatory Visit (HOSPITAL_COMMUNITY): Payer: Self-pay

## 2022-10-23 ENCOUNTER — Other Ambulatory Visit: Payer: Self-pay

## 2022-10-23 MED ORDER — ALPRAZOLAM 1 MG PO TABS
1.0000 mg | ORAL_TABLET | Freq: Three times a day (TID) | ORAL | 0 refills | Status: DC
  Filled 2022-10-26: qty 270, 90d supply, fill #0

## 2022-10-23 MED ORDER — AMPHETAMINE-DEXTROAMPHETAMINE 20 MG PO TABS
20.0000 mg | ORAL_TABLET | Freq: Three times a day (TID) | ORAL | 0 refills | Status: DC
  Filled 2022-10-26: qty 270, 90d supply, fill #0

## 2022-10-24 ENCOUNTER — Other Ambulatory Visit (HOSPITAL_COMMUNITY): Payer: Self-pay

## 2022-10-26 ENCOUNTER — Other Ambulatory Visit (HOSPITAL_COMMUNITY): Payer: Self-pay

## 2022-11-02 ENCOUNTER — Other Ambulatory Visit (HOSPITAL_COMMUNITY): Payer: Self-pay

## 2022-11-02 MED ORDER — FLUCONAZOLE 150 MG PO TABS
ORAL_TABLET | ORAL | 0 refills | Status: DC
  Filled 2022-11-02: qty 1, 3d supply, fill #0

## 2022-11-06 ENCOUNTER — Other Ambulatory Visit (HOSPITAL_COMMUNITY): Payer: Self-pay

## 2022-11-06 ENCOUNTER — Encounter (HOSPITAL_COMMUNITY)
Admission: RE | Admit: 2022-11-06 | Discharge: 2022-11-06 | Disposition: A | Discharge status: Home / Self Care | Payer: Commercial Managed Care - PPO | Source: Ambulatory Visit | Attending: Adult Health | Admitting: Adult Health

## 2022-11-06 DIAGNOSIS — E213 Hyperparathyroidism, unspecified: Secondary | ICD-10-CM | POA: Diagnosis not present

## 2022-11-06 MED ORDER — NYSTATIN 100000 UNIT/GM EX OINT
TOPICAL_OINTMENT | CUTANEOUS | 0 refills | Status: DC
  Filled 2022-11-06 – 2022-12-04 (×2): qty 30, 10d supply, fill #0

## 2022-11-06 MED ORDER — FLUCONAZOLE 150 MG PO TABS
ORAL_TABLET | ORAL | 0 refills | Status: DC
  Filled 2022-11-06: qty 1, 1d supply, fill #0

## 2022-11-06 MED ORDER — TECHNETIUM TC 99M SESTAMIBI GENERIC - CARDIOLITE
23.9000 | Freq: Once | INTRAVENOUS | Status: AC | PRN
  Administered 2022-11-06: 23.9 via INTRAVENOUS

## 2022-11-07 ENCOUNTER — Other Ambulatory Visit (HOSPITAL_COMMUNITY): Payer: Self-pay

## 2022-11-14 ENCOUNTER — Other Ambulatory Visit (HOSPITAL_COMMUNITY): Payer: Self-pay

## 2022-11-25 ENCOUNTER — Other Ambulatory Visit (HOSPITAL_COMMUNITY): Payer: Self-pay

## 2022-11-29 ENCOUNTER — Other Ambulatory Visit (HOSPITAL_COMMUNITY): Payer: Self-pay

## 2022-12-01 ENCOUNTER — Other Ambulatory Visit: Payer: Self-pay

## 2022-12-01 ENCOUNTER — Telehealth: Payer: Self-pay | Admitting: Gastroenterology

## 2022-12-01 ENCOUNTER — Other Ambulatory Visit (HOSPITAL_COMMUNITY): Payer: Self-pay

## 2022-12-01 MED ORDER — OMEPRAZOLE 40 MG PO CPDR
40.0000 mg | DELAYED_RELEASE_CAPSULE | Freq: Every morning | ORAL | 3 refills | Status: DC
  Filled 2022-12-01: qty 90, 90d supply, fill #0
  Filled 2023-02-27: qty 90, 90d supply, fill #1
  Filled 2023-06-16: qty 90, 90d supply, fill #2
  Filled 2023-09-09: qty 90, 90d supply, fill #3

## 2022-12-01 NOTE — Telephone Encounter (Signed)
PT is calling to have a refill for omeprazole sent to the [facility] outpatient pharmacy. Please advise

## 2022-12-01 NOTE — Telephone Encounter (Signed)
Refill sent to patients pharmacy. 

## 2022-12-04 ENCOUNTER — Other Ambulatory Visit (HOSPITAL_COMMUNITY): Payer: Self-pay

## 2022-12-04 DIAGNOSIS — F5101 Primary insomnia: Secondary | ICD-10-CM | POA: Diagnosis not present

## 2022-12-04 DIAGNOSIS — F4310 Post-traumatic stress disorder, unspecified: Secondary | ICD-10-CM | POA: Diagnosis not present

## 2022-12-04 DIAGNOSIS — F410 Panic disorder [episodic paroxysmal anxiety] without agoraphobia: Secondary | ICD-10-CM | POA: Diagnosis not present

## 2022-12-04 DIAGNOSIS — F900 Attention-deficit hyperactivity disorder, predominantly inattentive type: Secondary | ICD-10-CM | POA: Diagnosis not present

## 2022-12-04 MED ORDER — FLUCONAZOLE 150 MG PO TABS
ORAL_TABLET | ORAL | 0 refills | Status: DC
  Filled 2022-12-04: qty 2, 3d supply, fill #0

## 2022-12-12 ENCOUNTER — Other Ambulatory Visit (HOSPITAL_COMMUNITY): Payer: Self-pay

## 2022-12-14 ENCOUNTER — Other Ambulatory Visit (HOSPITAL_COMMUNITY): Payer: Self-pay

## 2022-12-27 ENCOUNTER — Other Ambulatory Visit (HOSPITAL_COMMUNITY): Payer: Self-pay | Admitting: Surgery

## 2022-12-27 DIAGNOSIS — E210 Primary hyperparathyroidism: Secondary | ICD-10-CM | POA: Diagnosis not present

## 2022-12-27 DIAGNOSIS — N200 Calculus of kidney: Secondary | ICD-10-CM | POA: Diagnosis not present

## 2022-12-28 ENCOUNTER — Emergency Department (HOSPITAL_BASED_OUTPATIENT_CLINIC_OR_DEPARTMENT_OTHER): Payer: Commercial Managed Care - PPO

## 2022-12-28 ENCOUNTER — Emergency Department (HOSPITAL_COMMUNITY): Payer: Commercial Managed Care - PPO

## 2022-12-28 ENCOUNTER — Encounter (HOSPITAL_COMMUNITY): Payer: Self-pay

## 2022-12-28 ENCOUNTER — Other Ambulatory Visit: Payer: Self-pay

## 2022-12-28 ENCOUNTER — Emergency Department (HOSPITAL_COMMUNITY)
Admission: EM | Admit: 2022-12-28 | Discharge: 2022-12-28 | Disposition: A | Discharge status: Home / Self Care | Payer: Commercial Managed Care - PPO | Attending: Emergency Medicine | Admitting: Emergency Medicine

## 2022-12-28 DIAGNOSIS — M7989 Other specified soft tissue disorders: Secondary | ICD-10-CM | POA: Diagnosis not present

## 2022-12-28 DIAGNOSIS — Z853 Personal history of malignant neoplasm of breast: Secondary | ICD-10-CM | POA: Diagnosis not present

## 2022-12-28 DIAGNOSIS — M25561 Pain in right knee: Secondary | ICD-10-CM | POA: Diagnosis not present

## 2022-12-28 DIAGNOSIS — M79604 Pain in right leg: Secondary | ICD-10-CM | POA: Insufficient documentation

## 2022-12-28 DIAGNOSIS — R2241 Localized swelling, mass and lump, right lower limb: Secondary | ICD-10-CM | POA: Insufficient documentation

## 2022-12-28 DIAGNOSIS — Z91040 Latex allergy status: Secondary | ICD-10-CM | POA: Diagnosis not present

## 2022-12-28 DIAGNOSIS — J45909 Unspecified asthma, uncomplicated: Secondary | ICD-10-CM | POA: Diagnosis not present

## 2022-12-28 MED ORDER — OXYCODONE-ACETAMINOPHEN 5-325 MG PO TABS
1.0000 | ORAL_TABLET | Freq: Once | ORAL | Status: AC
  Administered 2022-12-28: 1 via ORAL
  Filled 2022-12-28: qty 1

## 2022-12-28 MED ORDER — CYCLOBENZAPRINE HCL 10 MG PO TABS
10.0000 mg | ORAL_TABLET | Freq: Two times a day (BID) | ORAL | 0 refills | Status: DC | PRN
  Filled 2022-12-28 – 2023-02-20 (×3): qty 20, 10d supply, fill #0

## 2022-12-28 MED ORDER — PREDNISONE 20 MG PO TABS
ORAL_TABLET | ORAL | 0 refills | Status: AC
  Filled 2022-12-28 – 2023-01-15 (×2): qty 11, 5d supply, fill #0

## 2022-12-28 NOTE — Discharge Instructions (Signed)
I'm glad we didn't find any evidence of blood clot in your leg or anything significant changes in your knee.  Your pain may be due to a pinch nerve.  Take medications prescribed and follow up with your doctor for further care.  Feel better Misty [name].

## 2022-12-28 NOTE — ED Triage Notes (Signed)
Patient here for evaluation swelling and pain to the right leg. Swelling noted to the right knee. Patient reports pain to the entire leg, where she is unable to sleep. Denies any blood thinners. States the back is worse than the front.

## 2022-12-28 NOTE — Progress Notes (Signed)
RLE venous duplex has been completed.  Preliminary results given to Fayrene Helper PA-C.    Results can be found under chart review under CV PROC. 12/28/2022 7:14 PM Nahsir Venezia RVT, RDMS

## 2022-12-28 NOTE — ED Provider Notes (Signed)
[place] EMERGENCY DEPARTMENT AT [facility] Provider Note   CSN: [id number] Arrival date & time: 12/28/22  1706     History  Chief Complaint  Patient presents with   Leg Swelling    Misty [name] is a 47 y.o. female.  The history is provided by the patient and medical records. No language interpreter was used.     47 year old female significant history of breast cancer, anemia, asthma, planta fasciitis presenting complaining of leg pain.  Patient report gradual onset of progressive worsening pain about her right leg that started last night.  Pain is described as an achy throbbing sensation throughout her leg worse to the back of her knee but also spread to the anterior lower leg.  Pain kept her up from sleeping last night.  She has restless leg syndrome but this felt different.  She also felt her leg is swollen.  She does not endorse any chest pain or shortness of breath no hemoptysis no fever or chills.  She denies any specific trauma.  No prior history of DVT.  Denies any numbness or weakness of the leg.  Home Medications Prior to Admission medications   Medication Sig Start Date End Date Taking? Authorizing Provider  ALPRAZolam Prudy Feeler) 1 MG tablet Take 1 tablet by mouth 3 times daily. Patient taking differently: 3 (three) times daily as needed. 10/12/21     ALPRAZolam (XANAX) 1 MG tablet Take 1 tablet (1 mg total) by mouth 3 (three) times daily. 10/26/22     amphetamine-dextroamphetamine (ADDERALL) 20 MG tablet Take 1 tablet by mouth 3 times daily 04/24/22     amphetamine-dextroamphetamine (ADDERALL) 20 MG tablet Take 1 tablet (20 mg total) by mouth 3 (three) times daily. 10/26/22     fluconazole (DIFLUCAN) 150 MG tablet Take 1 tablet now, may repeat in 72 hours 12/04/22     fluconazole (DIFLUCAN) 200 MG tablet Take 1 tablet (200 mg) by mouth once a week. 08/17/22     hyoscyamine (ANASPAZ) 0.125 MG TBDP disintergrating tablet Dissolve 1 tablet by mouth twice a day as  needed for bladder pain related to the stent. 08/14/22     ketorolac (TORADOL) 10 MG tablet Take 1 tablet every 6  hours as needed for pain 07/20/22     methylcellulose (CITRUCEL) oral powder Take 1 packet by mouth daily. Patient taking differently: Take 1 packet by mouth daily. Not taking due to not being able to find. 01/10/22   Rachael Fee, MD  montelukast (SINGULAIR) 10 MG tablet Take 1 tablet (10 mg total) by mouth every evening. 03/02/22     nystatin ointment (MYCOSTATIN) APPLY TO THE AFFECTED AREA(S) BY TOPICAL ROUTE 3 TIMES PER DAY AS NEEDED 04/06/22     nystatin ointment (MYCOSTATIN) APPLY TO THE AFFECTED AREA(S) 3 TIMES PER DAY AS NEEDED. 11/06/22     omeprazole (PRILOSEC) 40 MG capsule Take 1 capsule (40 mg total) by mouth every morning 30 minutes before a meal 12/01/22   Mansouraty, Netty Starring., MD  ondansetron (ZOFRAN-ODT) 4 MG disintegrating tablet Dissolve 1 tablet (4 mg total) in the mouth every 6 (six) hours as needed for nausea 04/30/22   Coralyn Mark, NP  ondansetron (ZOFRAN-ODT) 8 MG disintegrating tablet Dissolve 1 tablet by mouth every 6 hours as needed for nausea 07/20/22     oxyCODONE-acetaminophen (PERCOCET/ROXICET) 5-325 MG tablet Take 1 tablet by mouth every 6 hours as needed for severe pain 07/20/22     phenazopyridine (PYRIDIUM) 200 MG tablet Take  1 tablet (200 mg total) by mouth 3 (three) times daily as needed for pain. 08/11/22   Crist Fat, MD  phenazopyridine (PYRIDIUM) 200 MG tablet Take 1 tablet (200 mg total) by mouth 3 (three) times daily. 08/14/22     SUMAtriptan (IMITREX) 50 MG tablet Take 1 tablet by mouth at onset of headache followed by 1 tablet 2 hours later if symptoms persist 06/07/21     tamoxifen (NOLVADEX) 10 MG tablet Take 1 tablet (10 mg total) by mouth daily. 06/21/22   Serena Croissant, MD  tamsulosin (FLOMAX) 0.4 MG CAPS capsule Take 1 capsule by mouth at bedtime 07/20/22         Allergies    Dilaudid [hydromorphone hcl], Ceftriaxone sodium,  and Latex    Review of Systems   Review of Systems  All other systems reviewed and are negative.   Physical Exam Updated Vital Signs BP (!) 137/93   Pulse 98   Temp 98 F (36.7 C) (Oral)   Resp 18   Ht 5\' 9"  (1.753 m)   Wt 89.6 kg   LMP 12/11/2022   SpO2 99%   BMI 29.17 kg/m  Physical Exam Vitals and nursing note reviewed.  Constitutional:      General: She is not in acute distress.    Appearance: She is well-developed.  HENT:     Head: Atraumatic.  Eyes:     Conjunctiva/sclera: Conjunctivae normal.  Pulmonary:     Effort: Pulmonary effort is normal.  Musculoskeletal:        General: Tenderness (Right lower extremity: Diffuse tenderness about the upper and lower leg and along the knee without any significant edema erythema or warmth appreciated.  Intact dorsalis pedis pulse with brisk cap refill.) present.     Cervical back: Neck supple.  Skin:    Findings: No rash.  Neurological:     Mental Status: She is alert.  Psychiatric:        Mood and Affect: Mood normal.     ED Results / Procedures / Treatments   Labs (all labs ordered are listed, but only abnormal results are displayed) Labs Reviewed - No data to display  EKG None  Radiology VAS [place] LOWER EXTREMITY VENOUS (DVT) (7a-7p)  Result Date: 12/28/2022  Lower Venous DVT Study Patient Name:  TRISTINA SAHAGIAN  Date of Exam:   12/28/2022 Medical Rec #: [id number]          Accession #:    [id number] Date of Birth: 03-06-1976          Patient Gender: F Patient Age:   50 years Exam Location:  [facility] Procedure:      VAS [place] LOWER EXTREMITY VENOUS (DVT) Referring Phys: Fayrene Helper --------------------------------------------------------------------------------  Indications: Pain, and Swelling.  Comparison Study: No previous exams Performing Technologist: Jody Hill RVT, RDMS  Examination Guidelines: A complete evaluation includes B-mode imaging, spectral Doppler, color Doppler, and power Doppler as needed of all  accessible portions of each vessel. Bilateral testing is considered an integral part of a complete examination. Limited examinations for reoccurring indications may be performed as noted. The reflux portion of the exam is performed with the patient in reverse Trendelenburg.  +---------+---------------+---------+-----------+----------+-------------------+ RIGHT    CompressibilityPhasicitySpontaneityPropertiesThrombus Aging      +---------+---------------+---------+-----------+----------+-------------------+ CFV      Full           Yes      Yes                                      +---------+---------------+---------+-----------+----------+-------------------+  SFJ      Full                                                             +---------+---------------+---------+-----------+----------+-------------------+ FV Prox  Full           Yes      Yes                                      +---------+---------------+---------+-----------+----------+-------------------+ FV Mid   Full           Yes      Yes                                      +---------+---------------+---------+-----------+----------+-------------------+ FV DistalFull           Yes      Yes                                      +---------+---------------+---------+-----------+----------+-------------------+ PFV      Full                                                             +---------+---------------+---------+-----------+----------+-------------------+ POP      Full           Yes      Yes                                      +---------+---------------+---------+-----------+----------+-------------------+ PTV      Full                                         Not well visualized +---------+---------------+---------+-----------+----------+-------------------+ PERO     Full                                         Not well visualized  +---------+---------------+---------+-----------+----------+-------------------+   +----+---------------+---------+-----------+----------+--------------+ LEFTCompressibilityPhasicitySpontaneityPropertiesThrombus Aging +----+---------------+---------+-----------+----------+--------------+ CFV Full           Yes      Yes                                 +----+---------------+---------+-----------+----------+--------------+    Summary: RIGHT: - There is no evidence of deep vein thrombosis in the lower extremity.  - No cystic structure found in the popliteal fossa.  LEFT: - No evidence of common femoral vein obstruction.  *See table(s) above for measurements and observations.    Preliminary    DG Knee Complete 4 Views Right  Result Date: 12/28/2022 CLINICAL DATA:  Right knee pain EXAM: RIGHT KNEE - COMPLETE 4+ VIEW COMPARISON:  None Available. FINDINGS: No evidence of fracture, dislocation, or joint effusion. Slight medial compartment joint space narrowing. Soft tissues are unremarkable. IMPRESSION: Slight medial compartment joint space narrowing. No acute findings. Electronically Signed   By: Duanne Guess D.O.   On: 12/28/2022 19:13    Procedures Procedures    Medications Ordered in ED Medications  oxyCODONE-acetaminophen (PERCOCET/ROXICET) 5-325 MG per tablet 1 tablet (has no administration in time range)  oxyCODONE-acetaminophen (PERCOCET/ROXICET) 5-325 MG per tablet 1 tablet (1 tablet Oral Given 12/28/22 1741)    ED Course/ Medical Decision Making/ A&P                             Medical Decision Making Amount and/or Complexity of Data Reviewed Radiology: ordered.  Risk Prescription drug management.   BP (!) 137/93   Pulse 98   Temp 98 F (36.7 C) (Oral)   Resp 18   Ht 5\' 9"  (1.753 m)   Wt 89.6 kg   LMP 12/11/2022   SpO2 99%   BMI 29.17 kg/m   25:60 PM  47 year old female significant history of breast cancer, anemia, asthma, planta fasciitis presenting  complaining of leg pain.  Patient report gradual onset of progressive worsening pain about her right leg that started last night.  Pain is described as an achy throbbing sensation throughout her leg worse to the back of her knee but also spread to the anterior lower leg.  Pain kept her up from sleeping last night.  She has restless leg syndrome but this felt different.  She also felt her leg is swollen.  She does not endorse any chest pain or shortness of breath no hemoptysis no fever or chills.  She denies any specific trauma.  No prior history of DVT.  Denies any numbness or weakness of the leg.  On exam, patient is laying bed appears to be in no acute discomfort.  She does have diffuse tenderness about her right lower extremity without significant erythema edema or warmth.  She is neurovascularly intact.  Vital signs overall reassuring no fever no hypoxia.  Given history of cancer and patient here with atraumatic right leg pain, a venous Doppler study have been ordered to rule out DVT.  I have low suspicion for fracture, dislocation, cellulitis, septic joint, or internal derangement of the knee.  Doubt claudication.  7:20 PM Imaging obtained independently viewed interpreted by me and I agree with radiology interpretation.  Fortunately x-ray of the right knee did not show any concerning finding, vascular ultrasound study did not show any evidence of DVT.  DDx: DVT, malignancy, sciatica, cellulitis, strain, sprain, fracture, dislocation, claudication.  Plan to discharge home with a short course of prednisone, and muscle relaxant and patient may take over-the-counter Tylenol or ibuprofen as needed for pain.  Return precaution given.  Patient with improvement of symptoms with pain medication.        Final Clinical Impression(s) / ED Diagnoses Final diagnoses:  Right leg pain    Rx / DC Orders ED Discharge Orders          Ordered    predniSONE (DELTASONE) 20 MG tablet        12/28/22  1922    cyclobenzaprine (FLEXERIL) 10 MG tablet  2 times daily PRN        12/28/22 1922  Fayrene Helper, PA-C 12/28/22 2011    Elayne Snare K, DO 12/28/22 2328

## 2022-12-29 ENCOUNTER — Ambulatory Visit (HOSPITAL_COMMUNITY)
Admission: RE | Admit: 2022-12-29 | Discharge: 2022-12-29 | Disposition: A | Discharge status: Home / Self Care | Payer: Commercial Managed Care - PPO | Source: Ambulatory Visit | Attending: Surgery | Admitting: Surgery

## 2022-12-29 ENCOUNTER — Other Ambulatory Visit (HOSPITAL_COMMUNITY): Payer: Self-pay

## 2022-12-29 DIAGNOSIS — E210 Primary hyperparathyroidism: Secondary | ICD-10-CM | POA: Diagnosis not present

## 2022-12-29 DIAGNOSIS — E213 Hyperparathyroidism, unspecified: Secondary | ICD-10-CM | POA: Diagnosis not present

## 2023-01-01 DIAGNOSIS — E210 Primary hyperparathyroidism: Secondary | ICD-10-CM | POA: Diagnosis not present

## 2023-01-01 NOTE — Progress Notes (Signed)
USN fails to confirm the position of a parathyroid adenoma.  [facility] Endo - please schedule patient for a 4D-CT scan with parathyroid protocol.  I will contact patient with results when available.  Darnell Level, MD [facility] practice Office: [phone]

## 2023-01-02 ENCOUNTER — Other Ambulatory Visit (HOSPITAL_COMMUNITY): Payer: Self-pay | Admitting: Surgery

## 2023-01-02 DIAGNOSIS — E210 Primary hyperparathyroidism: Secondary | ICD-10-CM

## 2023-01-06 ENCOUNTER — Other Ambulatory Visit (HOSPITAL_COMMUNITY): Payer: Self-pay

## 2023-01-09 ENCOUNTER — Other Ambulatory Visit (HOSPITAL_COMMUNITY): Payer: Self-pay

## 2023-01-11 ENCOUNTER — Other Ambulatory Visit (HOSPITAL_COMMUNITY): Payer: Self-pay

## 2023-01-11 ENCOUNTER — Encounter: Payer: Self-pay | Admitting: Hematology and Oncology

## 2023-01-11 ENCOUNTER — Other Ambulatory Visit: Payer: Self-pay | Admitting: Adult Health

## 2023-01-11 ENCOUNTER — Encounter: Payer: Self-pay | Admitting: Adult Health

## 2023-01-11 DIAGNOSIS — Z853 Personal history of malignant neoplasm of breast: Secondary | ICD-10-CM

## 2023-01-11 DIAGNOSIS — Z9889 Other specified postprocedural states: Secondary | ICD-10-CM

## 2023-01-12 ENCOUNTER — Encounter (HOSPITAL_COMMUNITY): Payer: Self-pay

## 2023-01-12 ENCOUNTER — Encounter (HOSPITAL_COMMUNITY)
Admission: RE | Admit: 2023-01-12 | Discharge: 2023-01-12 | Disposition: A | Discharge status: Home / Self Care | Payer: Commercial Managed Care - PPO | Source: Ambulatory Visit | Attending: Surgery | Admitting: Surgery

## 2023-01-12 DIAGNOSIS — E210 Primary hyperparathyroidism: Secondary | ICD-10-CM | POA: Diagnosis not present

## 2023-01-12 MED ORDER — SODIUM CHLORIDE (PF) 0.9 % IJ SOLN
INTRAMUSCULAR | Status: AC
  Filled 2023-01-12: qty 50

## 2023-01-12 MED ORDER — IOHEXOL 350 MG/ML SOLN
75.0000 mL | Freq: Once | INTRAVENOUS | Status: AC | PRN
  Administered 2023-01-12: 75 mL via INTRAVENOUS

## 2023-01-13 ENCOUNTER — Other Ambulatory Visit (HOSPITAL_COMMUNITY): Payer: Self-pay

## 2023-01-15 ENCOUNTER — Other Ambulatory Visit (HOSPITAL_COMMUNITY): Payer: Self-pay

## 2023-01-23 ENCOUNTER — Other Ambulatory Visit (HOSPITAL_COMMUNITY): Payer: Self-pay

## 2023-01-23 MED ORDER — AMPHETAMINE-DEXTROAMPHETAMINE 20 MG PO TABS
20.0000 mg | ORAL_TABLET | Freq: Three times a day (TID) | ORAL | 0 refills | Status: DC
  Filled 2023-01-26: qty 270, 90d supply, fill #0

## 2023-01-23 MED ORDER — ALPRAZOLAM 1 MG PO TABS
1.0000 mg | ORAL_TABLET | Freq: Three times a day (TID) | ORAL | 0 refills | Status: DC
  Filled 2023-01-26: qty 270, 90d supply, fill #0

## 2023-01-24 ENCOUNTER — Other Ambulatory Visit (HOSPITAL_COMMUNITY): Payer: Self-pay

## 2023-01-24 ENCOUNTER — Ambulatory Visit: Payer: Self-pay | Admitting: Surgery

## 2023-01-24 NOTE — Progress Notes (Signed)
CT scan localizes a parathyroid adenoma on the left side behind the thyroid.  This corresponds to the signal seen on the nuclear med sestamibi scan.  Good news.  Recommend proceeding with minimally invasive parathyroidectomy as an outpatient procedure as we discussed at the office.  I will enter orders and send to schedulers to contact patient and arrange surgery.  Darnell Level, MD [facility] practice Office: [phone]

## 2023-01-26 ENCOUNTER — Other Ambulatory Visit (HOSPITAL_COMMUNITY): Payer: Self-pay

## 2023-02-12 ENCOUNTER — Other Ambulatory Visit (HOSPITAL_COMMUNITY): Payer: Self-pay

## 2023-02-12 IMAGING — US US  BREAST BX W/ LOC DEV 1ST LESION IMG BX SPEC US GUIDE*R*
1 series · 11 of 11 positions shown · non-contrast
Comparison: Previous exam(s).
COMPARISON: Previous exam(s).

Addendum:
CLINICAL DATA: Patient presents for ultrasound-guided core biopsy
of RIGHT breast lesion.

EXAM:
ULTRASOUND GUIDED RIGHT BREAST CORE NEEDLE BIOPSY

[Series 1: us breast bx w/ loc dev 1st lesion img bx spec us  · 0.07mm/px · 11 of 11 slices shown]
[im 1/11]
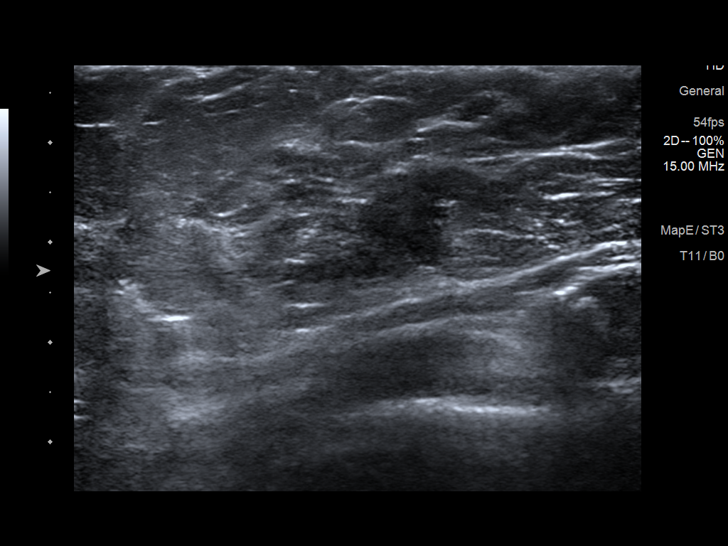
[im 2/11]
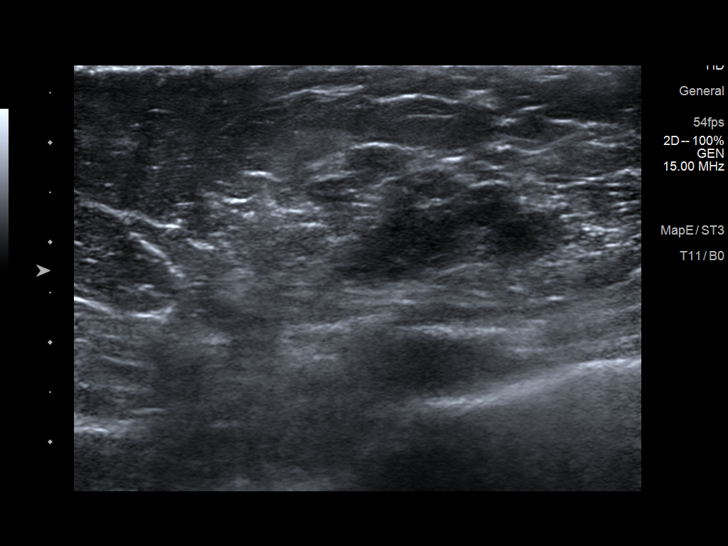
[im 3/11]
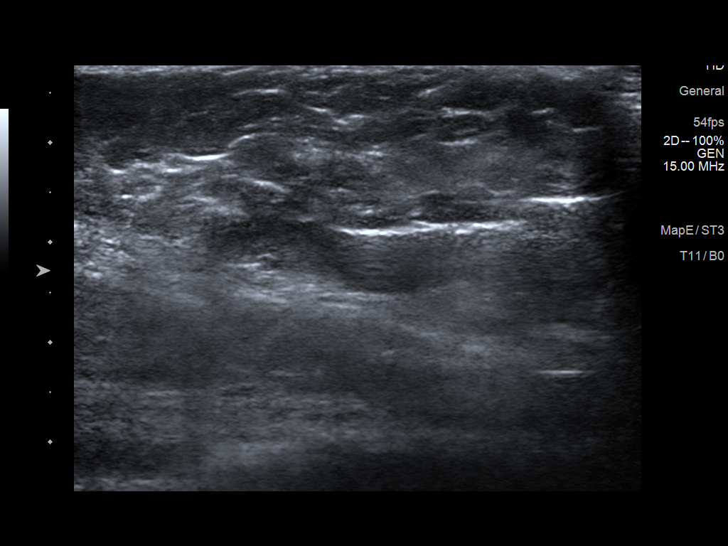
[im 4/11]
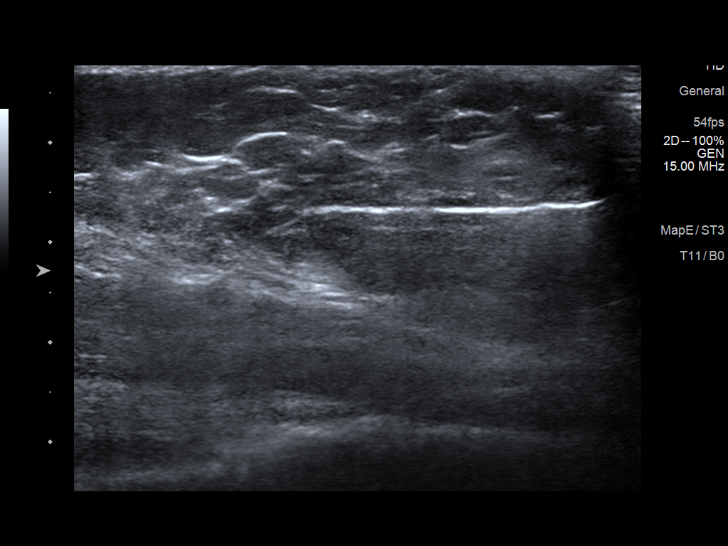
[im 5/11]
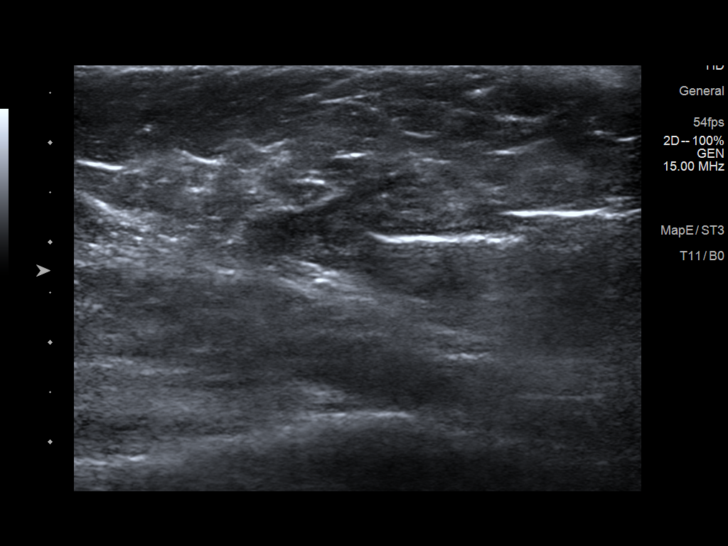
[im 6/11]
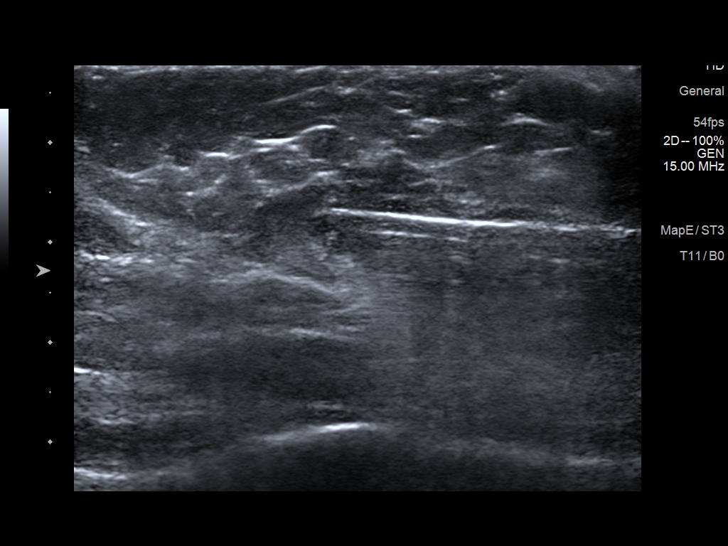
[im 7/11]
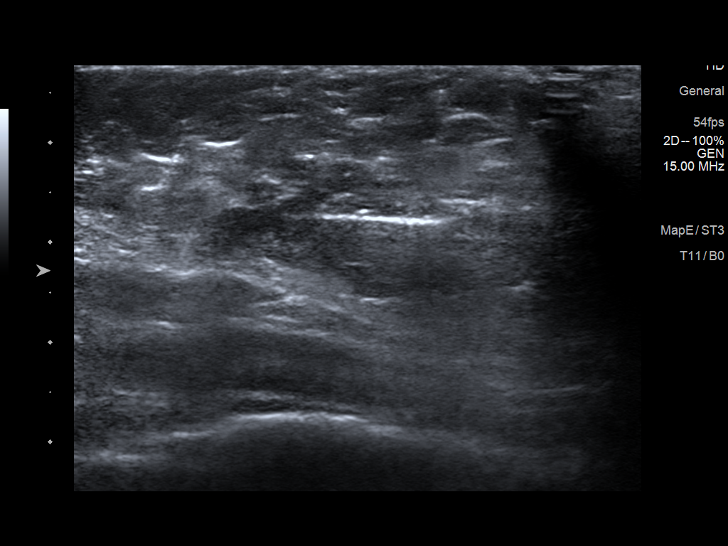
[im 8/11]
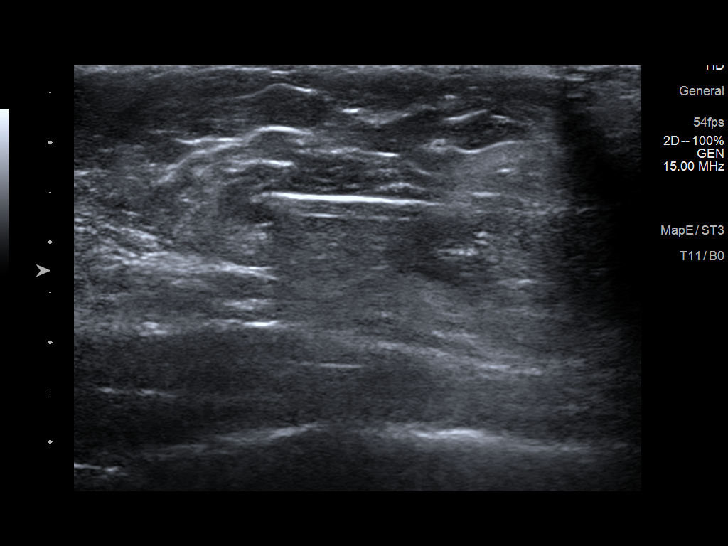
[im 9/11]
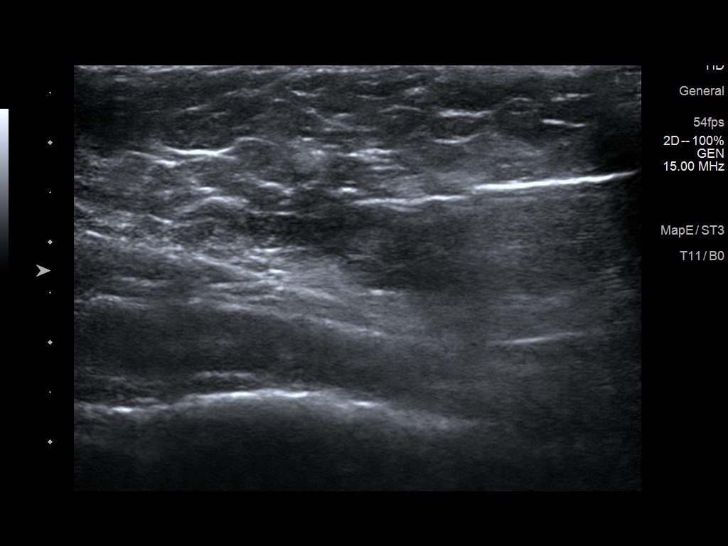
[im 10/11]
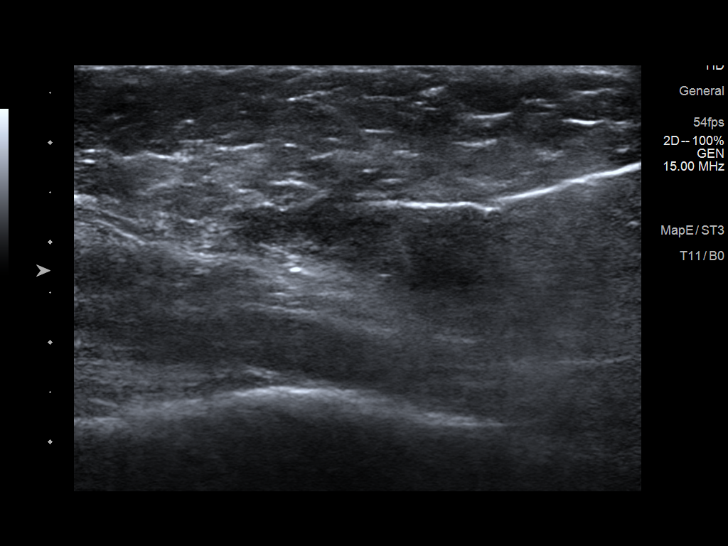
[im 11/11]
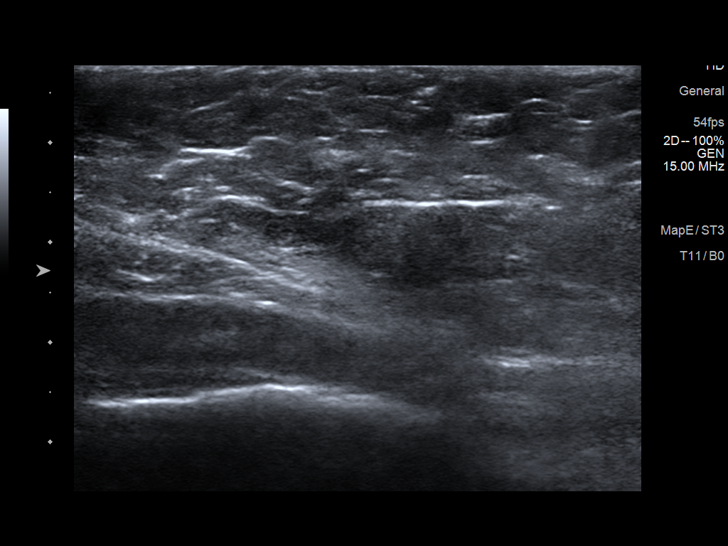

[11 of 11 positions shown; findings below may reference images not displayed]



Lesion quadrant: LOWER INNER QUADRANT RIGHT breast

Using sterile technique and 1% Lidocaine as local anesthetic, under
direct ultrasound visualization, a 12 gauge Siobhan device was
used to perform biopsy of mass in the 4 o'clock location of the
RIGHT breast using a inferior to superior approach. At the
conclusion of the procedure tribell tissue marker clip was deployed
into the biopsy cavity. Follow up 2 view mammogram was performed and
dictated separately.
IMPRESSION: Ultrasound guided biopsy of RIGHT breast mass. No apparent
complications.

ADDENDUM:
Pathology revealed LOW / INTERMEDIATE GRADE DUCTAL CARCINOMA IN SITU
INVOLVING PAPILLARY LESIONS of the RIGHT breast, 4 o'clock, tribell
clip. This was found to be concordant by Dr. Iok Kun Ustaris.

Pathology results were discussed with the patient by telephone. The
patient reported doing well after the biopsy with tenderness at the
site. Post biopsy instructions and care were reviewed and questions
were answered. The patient was encouraged to call The [REDACTED]

Surgical consultation has been arranged with Dr. Jaskaran Folk at
[REDACTED] on May 16, 2021.

Pathology results reported by Eliter Ninabanda RN on 05/04/2021.



Lesion quadrant: LOWER INNER QUADRANT RIGHT breast

Using sterile technique and 1% Lidocaine as local anesthetic, under
direct ultrasound visualization, a 12 gauge Siobhan device was
used to perform biopsy of mass in the 4 o'clock location of the
RIGHT breast using a inferior to superior approach. At the
conclusion of the procedure tribell tissue marker clip was deployed
into the biopsy cavity. Follow up 2 view mammogram was performed and
dictated separately.
IMPRESSION: Ultrasound guided biopsy of RIGHT breast mass. No apparent
complications.

## 2023-02-12 IMAGING — MG MM BREAST LOCALIZATION CLIP
6 series · 6 of 18 positions shown · non-contrast
Comparison: Previous exam(s).

CLINICAL DATA: Status post ultrasound-guided core biopsy of RIGHT
breast mass.

EXAM:
3D DIAGNOSTIC RIGHT MAMMOGRAM POST ULTRASOUND BIOPSY

[R MLO synth-2D]
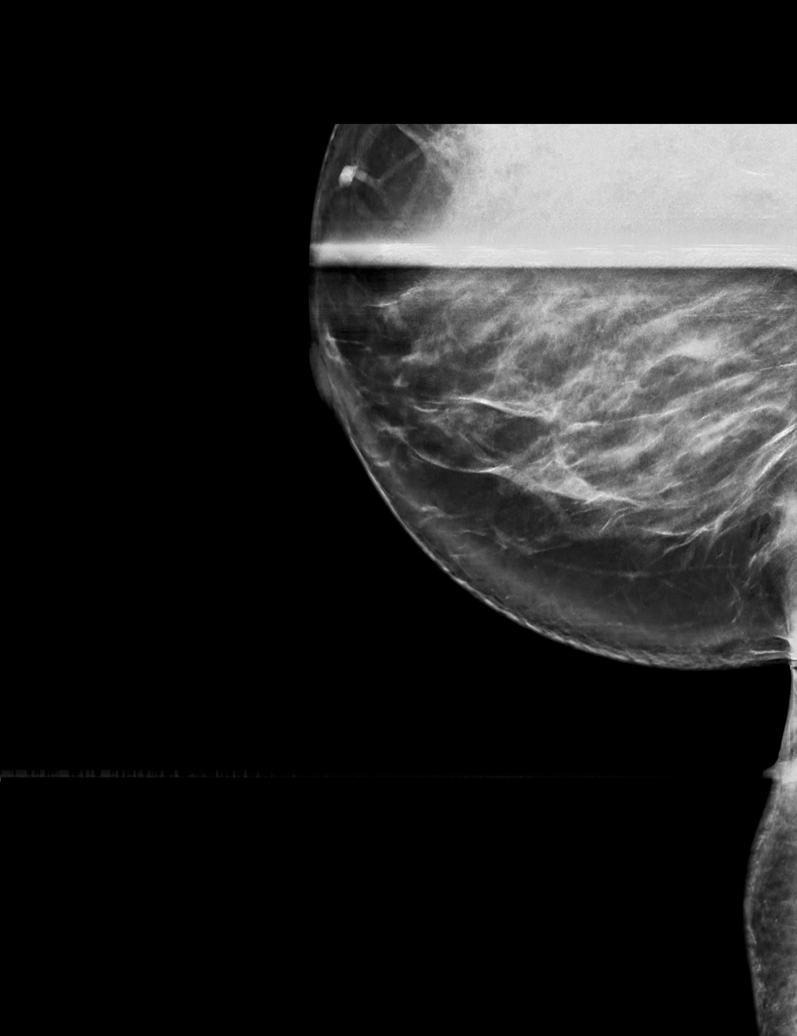

[R CV synth-2D]
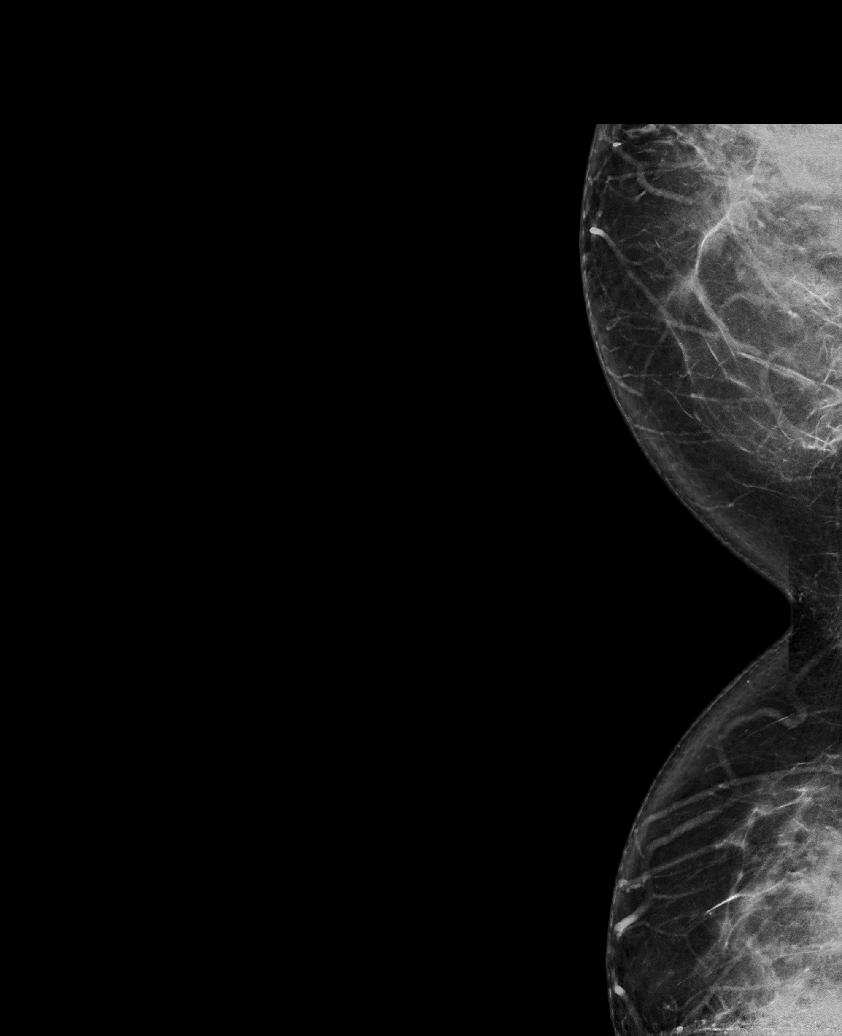

[R ML synth-2D]
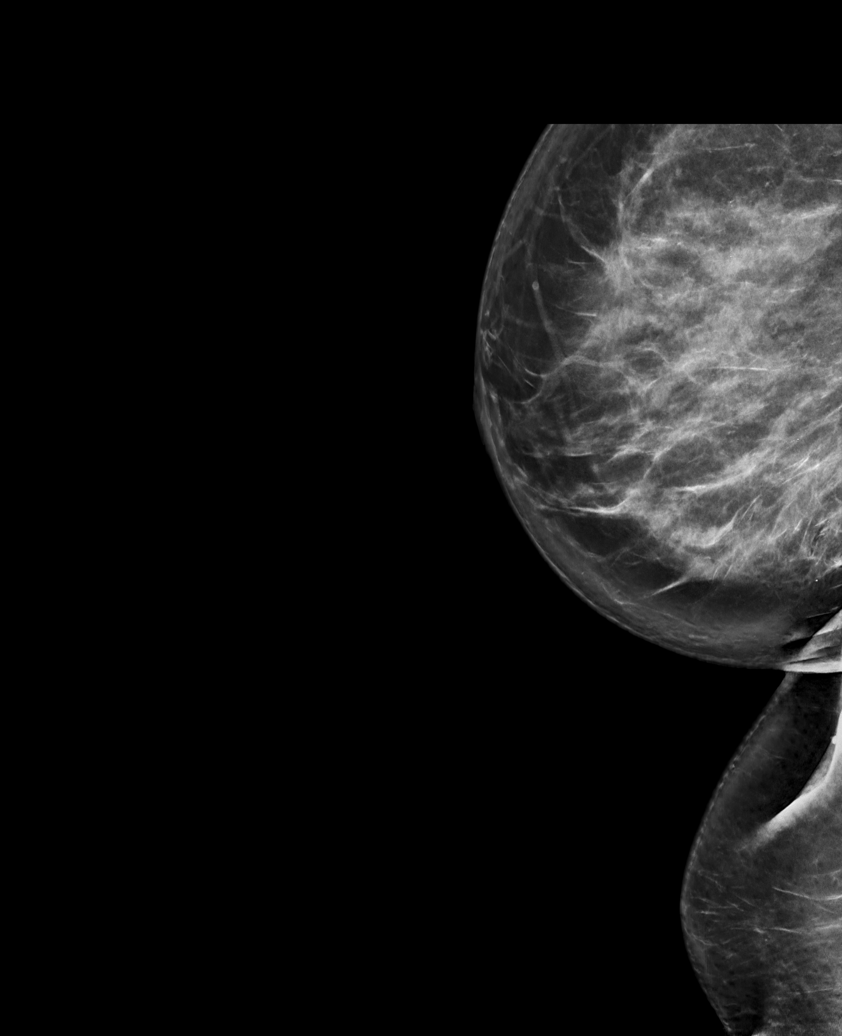

[R CV tomo · tomo slice 45/89.0]
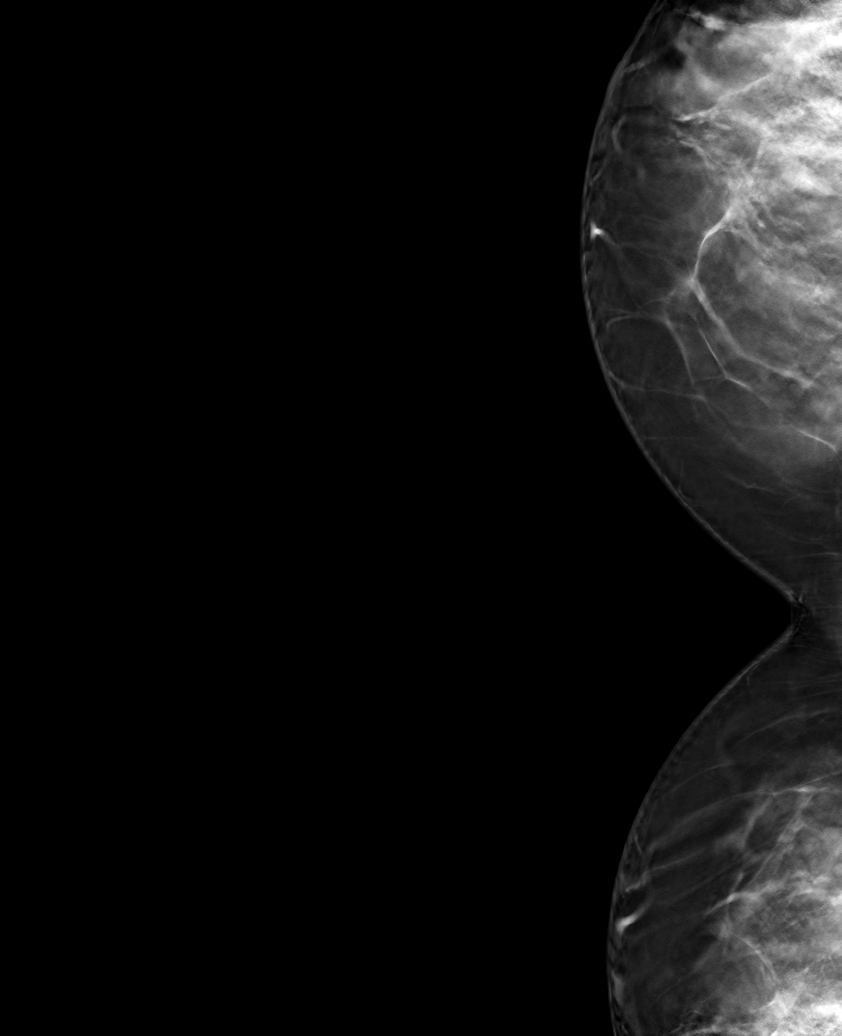

[R MLO tomo · tomo slice 50/99.0]
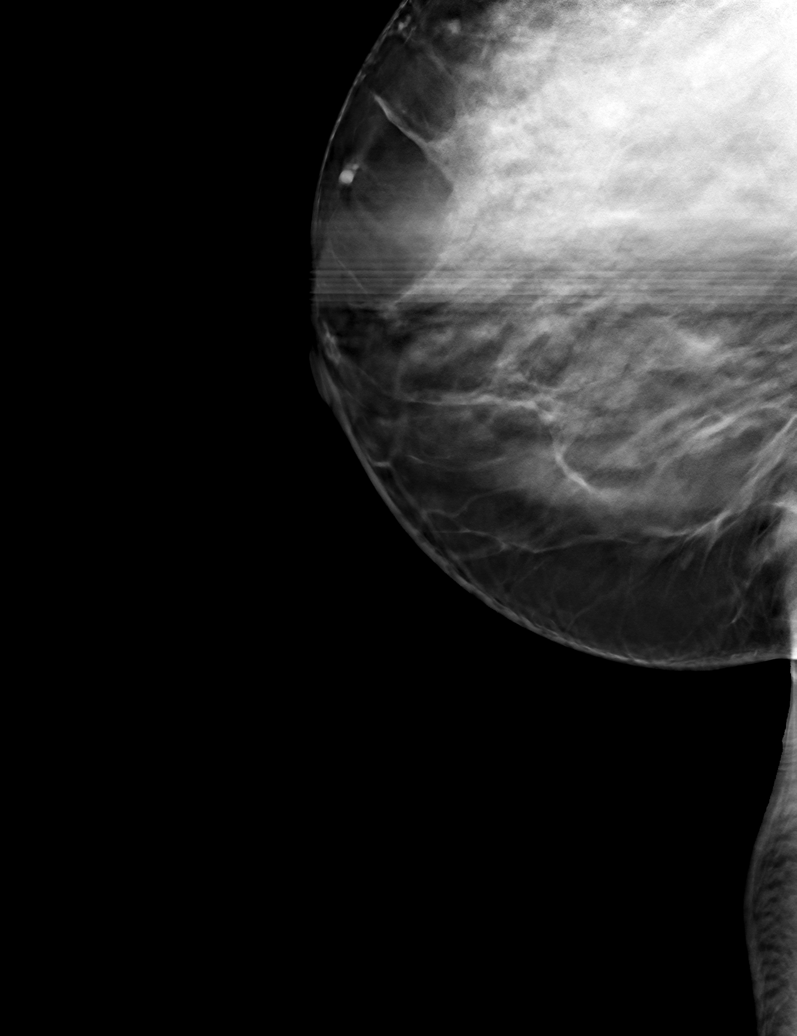

[R ML tomo · tomo slice 47/94.0]
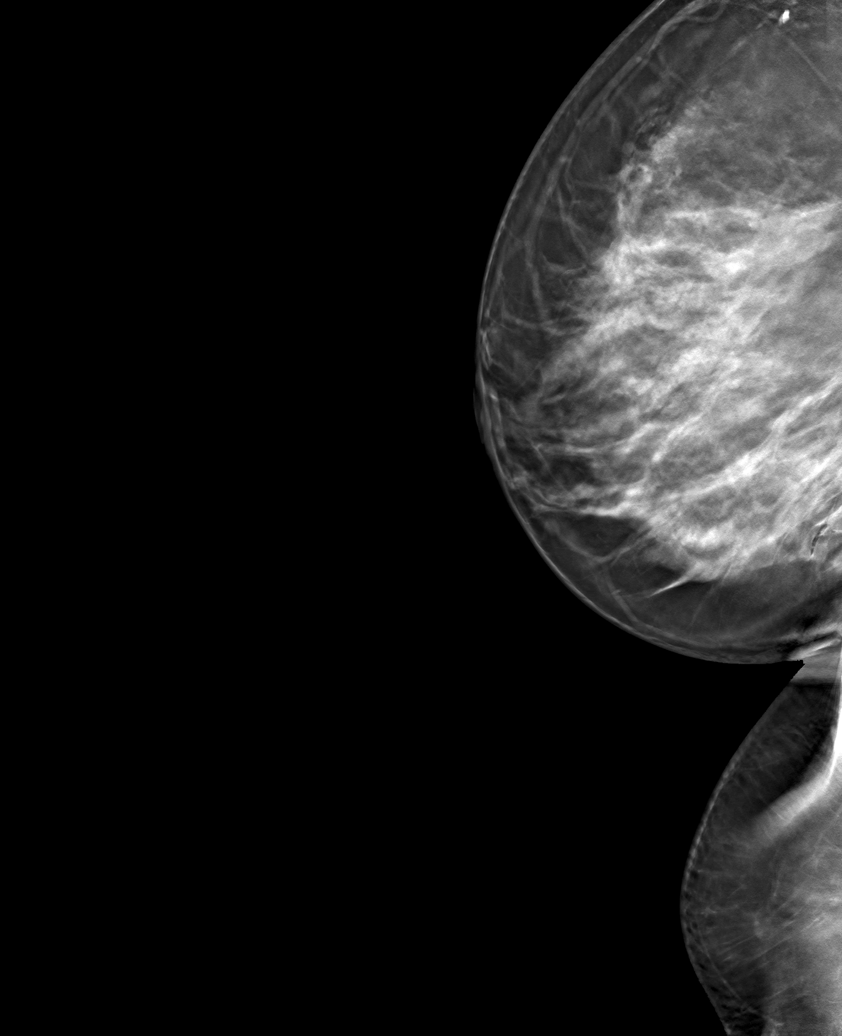

[6 of 18 positions shown; findings below may reference images not displayed]

FINDINGS: 3D Mammographic images were obtained following ultrasound guided
biopsy of RIGHT breast mass and placement of a tribell clip. Due to
the posterior location of the lesion, the clip is not visualized
despite additional views.
IMPRESSION: Tribell clip is not visible on mammographic images secondary to its
posterior location.

Final Assessment: Post Procedure Mammograms for Marker Placement

## 2023-02-12 MED ORDER — FLUCONAZOLE 150 MG PO TABS
150.0000 mg | ORAL_TABLET | ORAL | 0 refills | Status: DC
  Filled 2023-02-12: qty 1, 1d supply, fill #0

## 2023-02-20 ENCOUNTER — Other Ambulatory Visit (HOSPITAL_COMMUNITY): Payer: Self-pay

## 2023-03-01 NOTE — Patient Instructions (Signed)
DUE TO COVID-19 ONLY TWO VISITORS  (aged 49 and older)  ARE ALLOWED TO COME WITH YOU AND STAY IN THE WAITING ROOM ONLY DURING PRE OP AND PROCEDURE.   **NO VISITORS ARE ALLOWED IN THE SHORT STAY AREA OR RECOVERY ROOM!!**  IF YOU WILL BE ADMITTED INTO THE HOSPITAL YOU ARE ALLOWED ONLY FOUR SUPPORT PEOPLE DURING VISITATION HOURS ONLY (7 AM -8PM)   The support person(s) must pass our screening, gel in and out, and wear a mask at all times, including in the patient's room. Patients must also wear a mask when staff or their support person are in the room. Visitors GUEST BADGE MUST BE WORN VISIBLY  One adult visitor may remain with you overnight and MUST be in the room by 8 P.M.     Your procedure is scheduled on: 03/09/23   Report to [facility] Main Entrance    Report to admitting at: 5: 15 AM   Call this number if you have problems the morning of surgery [phone]   Do not eat food :After Midnight.   After Midnight you may have the following liquids until : 4:30 AM DAY OF SURGERY  Water Black Coffee (sugar ok, NO MILK/CREAM OR CREAMERS)  Tea (sugar ok, NO MILK/CREAM OR CREAMERS) regular and decaf                             Plain Jell-O (NO RED)                                           Fruit ices (not with fruit pulp, NO RED)                                     Popsicles (NO RED)                                                                  Juice: apple, WHITE grape, WHITE cranberry Sports drinks like Gatorade (NO RED)   The day of surgery:  Drink ONE (1) Pre-Surgery Clear Ensure at : 4:30 AM the morning of surgery. Drink in one sitting. Do not sip.  This drink was given to you during your hospital  pre-op appointment visit. Nothing else to drink after completing the  Pre-Surgery Clear Ensure or G2.          If you have questions, please contact your surgeon's office.  FOLLOW  ANY ADDITIONAL PRE OP INSTRUCTIONS YOU RECEIVED FROM YOUR SURGEON'S OFFICE!!!   Oral  Hygiene is also important to reduce your risk of infection.                                    Remember - BRUSH YOUR TEETH THE MORNING OF SURGERY WITH YOUR REGULAR TOOTHPASTE  DENTURES WILL BE REMOVED PRIOR TO SURGERY PLEASE DO NOT APPLY "Poly grip" OR ADHESIVES!!!   Do NOT smoke after Midnight   Take these medicines the morning of  surgery with A SIP OF WATER: tamoxifen,omeprazole.Alprazolam,sumatriptan as needed.                              You may not have any metal on your body including hair pins, jewelry, and body piercing             Do not wear make-up, lotions, powders, perfumes/cologne, or deodorant  Do not wear nail polish including gel and S&S, artificial/acrylic nails, or any other type of covering on natural nails including finger and toenails. If you have artificial nails, gel coating, etc. that needs to be removed by a nail salon please have this removed prior to surgery or surgery may need to be canceled/ delayed if the surgeon/ anesthesia feels like they are unable to be safely monitored.   Do not shave  48 hours prior to surgery.    Do not bring valuables to the hospital. [facility] IS NOT             RESPONSIBLE   FOR VALUABLES.   Contacts, glasses, or bridgework may not be worn into surgery.   Bring small overnight bag day of surgery.   DO NOT BRING YOUR HOME MEDICATIONS TO THE HOSPITAL. PHARMACY WILL DISPENSE MEDICATIONS LISTED ON YOUR MEDICATION LIST TO YOU DURING YOUR ADMISSION IN THE HOSPITAL!    Patients discharged on the day of surgery will not be allowed to drive home.  Someone NEEDS to stay with you for the first 24 hours after anesthesia.   Special Instructions: Bring a copy of your healthcare power of attorney and living will documents         the day of surgery if you haven't scanned them before.              Please read over the following fact sheets you were given: IF YOU HAVE QUESTIONS ABOUT YOUR PRE-OP INSTRUCTIONS PLEASE CALL [phone]    [facility] - Preparing for Surgery Before surgery, you can play an important role.  Because skin is not sterile, your skin needs to be as free of germs as possible.  You can reduce the number of germs on your skin by washing with CHG (chlorahexidine gluconate) soap before surgery.  CHG is an antiseptic cleaner which kills germs and bonds with the skin to continue killing germs even after washing. Please DO NOT use if you have an allergy to CHG or antibacterial soaps.  If your skin becomes reddened/irritated stop using the CHG and inform your nurse when you arrive at Short Stay. Do not shave (including legs and underarms) for at least 48 hours prior to the first CHG shower.  You may shave your face/neck. Please follow these instructions carefully:  1.  Shower with CHG Soap the night before surgery and the  morning of Surgery.  2.  If you choose to wash your hair, wash your hair first as usual with your  normal  shampoo.  3.  After you shampoo, rinse your hair and body thoroughly to remove the  shampoo.                           4.  Use CHG as you would any other liquid soap.  You can apply chg directly  to the skin and wash  Gently with a scrungie or clean washcloth.  5.  Apply the CHG Soap to your body ONLY FROM THE NECK DOWN.   Do not use on face/ open                           Wound or open sores. Avoid contact with eyes, ears mouth and genitals (private parts).                       Wash face,  Genitals (private parts) with your normal soap.             6.  Wash thoroughly, paying special attention to the area where your surgery  will be performed.  7.  Thoroughly rinse your body with warm water from the neck down.  8.  DO NOT shower/wash with your normal soap after using and rinsing off  the CHG Soap.                9.  Pat yourself dry with a clean towel.            10.  Wear clean pajamas.            11.  Place clean sheets on your bed the night of your first shower and do not   sleep with pets. Day of Surgery : Do not apply any lotions/deodorants the morning of surgery.  Please wear clean clothes to the hospital/surgery center.  FAILURE TO FOLLOW THESE INSTRUCTIONS MAY RESULT IN THE CANCELLATION OF YOUR SURGERY PATIENT SIGNATURE_________________________________  NURSE SIGNATURE__________________________________  ________________________________________________________________________

## 2023-03-02 ENCOUNTER — Other Ambulatory Visit (HOSPITAL_COMMUNITY): Payer: Self-pay

## 2023-03-05 ENCOUNTER — Other Ambulatory Visit: Payer: Self-pay

## 2023-03-05 ENCOUNTER — Encounter (HOSPITAL_COMMUNITY): Payer: Self-pay

## 2023-03-05 ENCOUNTER — Encounter (HOSPITAL_COMMUNITY)
Admission: RE | Admit: 2023-03-05 | Discharge: 2023-03-05 | Disposition: A | Discharge status: Home / Self Care | Payer: Commercial Managed Care - PPO | Source: Ambulatory Visit | Attending: Surgery | Admitting: Surgery

## 2023-03-05 VITALS — BP 132/84 | HR 72 | Temp 98.7°F | Ht 69.0 in | Wt 202.0 lb

## 2023-03-05 DIAGNOSIS — Z01818 Encounter for other preprocedural examination: Secondary | ICD-10-CM

## 2023-03-05 DIAGNOSIS — Z01812 Encounter for preprocedural laboratory examination: Secondary | ICD-10-CM | POA: Insufficient documentation

## 2023-03-05 HISTORY — DX: Unspecified osteoarthritis, unspecified site: M19.90

## 2023-03-05 LAB — CBC
HCT: 31.2 % — ABNORMAL LOW (ref 36.0–46.0)
Hemoglobin: 8.7 g/dL — ABNORMAL LOW (ref 12.0–15.0)
MCH: 17.4 pg — ABNORMAL LOW (ref 26.0–34.0)
MCHC: 27.9 g/dL — ABNORMAL LOW (ref 30.0–36.0)
MCV: 62.5 fL — ABNORMAL LOW (ref 80.0–100.0)
Platelets: 332 10*3/uL (ref 150–400)
RBC: 4.99 MIL/uL (ref 3.87–5.11)
RDW: 24.7 % — ABNORMAL HIGH (ref 11.5–15.5)
WBC: 3.2 10*3/uL — ABNORMAL LOW (ref 4.0–10.5)
nRBC: 0 % (ref 0.0–0.2)

## 2023-03-05 NOTE — Progress Notes (Signed)
Lab. Results: Hemoglobin: 8.7

## 2023-03-05 NOTE — Progress Notes (Signed)
For Short Stay: COVID SWAB appointment date:  Bowel Prep reminder:   For Anesthesia: PCP - [facility]  Cardiologist - N/A  Chest x-ray -  EKG -  Stress Test -  ECHO -  Cardiac Cath -  Pacemaker/ICD device last checked: Pacemaker orders received: Device Rep notified:  Spinal Cord Stimulator: N/A  Sleep Study - N/A CPAP -   Fasting Blood Sugar - N/A Checks Blood Sugar _____ times a day Date and result of last Hgb A1c-  Last dose of GLP1 agonist- N/A GLP1 instructions:   Last dose of SGLT-2 inhibitors- N/A SGLT-2 instructions:   Blood Thinner Instructions: N/A Aspirin Instructions: Last Dose:  Activity level: Can go up a flight of stairs and activities of daily living without stopping and without chest pain and/or shortness of breath   Able to exercise without chest pain and/or shortness of breath  Anesthesia review:   Patient denies shortness of breath, fever, cough and chest pain at PAT appointment   Patient verbalized understanding of instructions that were given to them at the PAT appointment. Patient was also instructed that they will need to review over the PAT instructions again at home before surgery.

## 2023-03-07 ENCOUNTER — Encounter (HOSPITAL_COMMUNITY): Payer: Self-pay | Admitting: Surgery

## 2023-03-07 ENCOUNTER — Other Ambulatory Visit: Payer: Self-pay

## 2023-03-07 DIAGNOSIS — E210 Primary hyperparathyroidism: Secondary | ICD-10-CM | POA: Diagnosis present

## 2023-03-07 NOTE — H&P (Signed)
REFERRING PHYSICIAN: Crist Fat, MD  PROVIDER: Jaquise Faux Myra Rude, MD   Chief Complaint: New Consultation (Primary hyperparathyroidism)  History of Present Illness:  Patient is referred by Dr. Berniece Salines for surgical evaluation and management of suspected primary hyperparathyroidism. Patient has had a long history of nephrolithiasis. Recent laboratory studies have shown an elevated calcium level and an elevated intact PTH level. Patient was seen by her urologist and underwent a nuclear medicine parathyroid scan on Nov 06, 2022. This demonstrated persistent focal uptake at the inferior left thyroid lobe suspicious for parathyroid adenoma. Patient is now referred for further evaluation and recommendations. Patient notes significant fatigue. She has bone and joint discomfort. She has had multiple episodes of nephrolithiasis. She has not had a bone density scan. There is no family history of parathyroid disease. There is no family history of other endocrine neoplasm. Patient has had no prior head or neck surgery. 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  Review of Systems: A complete review of systems was obtained from the patient. I have reviewed this information and discussed as appropriate with the patient. See HPI as well for other ROS.  Review of Systems  Constitutional: Positive for malaise/fatigue.  HENT: Negative.  Eyes: Negative.  Respiratory: Negative.  Cardiovascular: Negative.  Gastrointestinal: Positive for constipation.  Genitourinary: Negative.  Musculoskeletal: Positive for joint pain.  Skin: Negative.  Neurological: Negative.  Endo/Heme/Allergies: Negative.  Psychiatric/Behavioral: Negative.    Medical History: Past Medical History:  Diagnosis Date  Asthma, unspecified asthma severity, unspecified whether complicated, unspecified whether persistent (HHS-HCC)  History of  cancer   Patient Active Problem List  Diagnosis  Primary hyperparathyroidism (CMS/HHS-HCC)  Nephrolithiasis   Past Surgical History:  Procedure Laterality Date  MASTECTOMY PARTIAL / LUMPECTOMY Right 05/26/2021  kidney stone removal    Allergies  Allergen Reactions  Hydromorphone Nausea And Vomiting, Other (See Comments) and Shortness Of Breath  Headache  Pt has takes percocet at home (see med rec).  Ceftriaxone Other (See Comments)  Hair loss  Hair loss  Latex Itching and Rash   Current Outpatient Medications on File Prior to Visit  Medication Sig Dispense Refill  tamoxifen (NOLVADEX) 10 MG tablet  ALPRAZolam (XANAX) 1 MG tablet alprazolam 1 mg tablet  meloxicam (MOBIC) 15 MG tablet  montelukast (SINGULAIR) 10 mg tablet montelukast 10 mg tablet  omeprazole (PRILOSEC) 40 MG DR capsule   No current facility-administered medications on file prior to visit.   Family History  Problem Relation Age of Onset  High blood pressure (Hypertension) Mother    Social History   Tobacco Use  Smoking Status Never  Smokeless Tobacco Never    Social History   Socioeconomic History  Marital status: Divorced  Tobacco Use  Smoking status: Never  Smokeless tobacco: Never  Vaping Use  Vaping status: Never Used  Substance and Sexual Activity  Alcohol use: Yes  Drug use: Never   Objective:   Vitals:  Pulse: (!) 111  Temp: 37.1 C (98.7 F)  Weight: 91.4 kg (201 lb 9.6 oz)  Height: 175.3 cm (5\' 9" )  PainSc: 0-No pain   Body mass index is 29.77 kg/m.  Physical Exam   GENERAL APPEARANCE Comfortable, no acute issues Development: normal Gross deformities: none  SKIN Rash, lesions, ulcers: none Induration, erythema: none Nodules: none palpable  EYES Conjunctiva and lids: normal Pupils: equal and reactive  EARS, NOSE, MOUTH, THROAT External ears: no lesion  or deformity External nose: no lesion or deformity Hearing: grossly normal  NECK Symmetric:  yes Trachea: midline Thyroid: no palpable nodules in the thyroid bed  ABDOMEN Not assessed  GENITOURINARY/RECTAL Not assessed  MUSCULOSKELETAL Station and gait: normal Digits and nails: no clubbing or cyanosis Muscle strength: grossly normal all extremities Range of motion: grossly normal all extremities Deformity: none  LYMPHATIC Cervical: none palpable Supraclavicular: none palpable  PSYCHIATRIC Oriented to person, place, and time: yes Mood and affect: normal for situation Judgment and insight: appropriate for situation   Assessment and Plan:   Primary hyperparathyroidism (CMS/HHS-HCC) Nephrolithiasis  Patient is referred by her urologist for surgical evaluation and recommendations regarding suspected primary hyperparathyroidism.  Patient provided with a copy of "Parathyroid Surgery: Treatment for Your Parathyroid Gland Problem", published by [employer], 12 pages. Book reviewed and explained to patient during visit today.  Today we reviewed her clinical history. We reviewed her available laboratory studies. We reviewed the results of her nuclear medicine parathyroid scan. Patient likely has primary hyperparathyroidism and possibly a left inferior parathyroid adenoma. I would like to repeat her laboratory studies at our reference laboratory to include a calcium level, PTH level, and 25-hydroxy vitamin D level. We will also obtain a 24-hour urine collection for calcium. We will obtain an ultrasound examination of the neck both to evaluate the thyroid gland and to rule out an enlarged parathyroid gland.  Once the studies are performed and resulted, I will contact the patient. We will make plans for further management at that time.  Today we did discuss minimally invasive parathyroid surgery. We discussed the size and location of the surgical incision. We discussed the hospital stay to be anticipated. We discussed the postoperative recovery and returned to work and activities. The  patient understands and agrees to proceed with further evaluation.  Darnell Level, MD [facility] practice Office: [phone]

## 2023-03-09 ENCOUNTER — Ambulatory Visit (HOSPITAL_COMMUNITY): Payer: Commercial Managed Care - PPO | Admitting: Anesthesiology

## 2023-03-09 ENCOUNTER — Other Ambulatory Visit: Payer: Self-pay

## 2023-03-09 ENCOUNTER — Ambulatory Visit (HOSPITAL_COMMUNITY)
Admission: RE | Admit: 2023-03-09 | Discharge: 2023-03-09 | Disposition: A | Discharge status: Home / Self Care | Payer: Commercial Managed Care - PPO | Attending: Surgery | Admitting: Surgery

## 2023-03-09 ENCOUNTER — Other Ambulatory Visit (HOSPITAL_COMMUNITY): Payer: Self-pay

## 2023-03-09 ENCOUNTER — Ambulatory Visit (HOSPITAL_BASED_OUTPATIENT_CLINIC_OR_DEPARTMENT_OTHER): Payer: Commercial Managed Care - PPO | Admitting: Anesthesiology

## 2023-03-09 ENCOUNTER — Encounter (HOSPITAL_COMMUNITY)
Admission: RE | Disposition: A | Discharge status: Home / Self Care | Payer: Self-pay | Source: Home / Self Care | Attending: Surgery

## 2023-03-09 ENCOUNTER — Encounter (HOSPITAL_COMMUNITY): Payer: Self-pay | Admitting: Surgery

## 2023-03-09 DIAGNOSIS — Z87442 Personal history of urinary calculi: Secondary | ICD-10-CM | POA: Diagnosis not present

## 2023-03-09 DIAGNOSIS — D351 Benign neoplasm of parathyroid gland: Secondary | ICD-10-CM | POA: Insufficient documentation

## 2023-03-09 DIAGNOSIS — D649 Anemia, unspecified: Secondary | ICD-10-CM | POA: Diagnosis not present

## 2023-03-09 DIAGNOSIS — Z01818 Encounter for other preprocedural examination: Secondary | ICD-10-CM

## 2023-03-09 DIAGNOSIS — E213 Hyperparathyroidism, unspecified: Secondary | ICD-10-CM

## 2023-03-09 DIAGNOSIS — Z9011 Acquired absence of right breast and nipple: Secondary | ICD-10-CM | POA: Diagnosis not present

## 2023-03-09 DIAGNOSIS — F418 Other specified anxiety disorders: Secondary | ICD-10-CM | POA: Diagnosis not present

## 2023-03-09 DIAGNOSIS — Z853 Personal history of malignant neoplasm of breast: Secondary | ICD-10-CM | POA: Diagnosis not present

## 2023-03-09 DIAGNOSIS — E210 Primary hyperparathyroidism: Secondary | ICD-10-CM | POA: Diagnosis not present

## 2023-03-09 HISTORY — PX: PARATHYROIDECTOMY: SHX19

## 2023-03-09 LAB — POCT PREGNANCY, URINE: Preg Test, Ur: NEGATIVE

## 2023-03-09 SURGERY — PARATHYROIDECTOMY
Anesthesia: General | Site: Neck | Laterality: Left

## 2023-03-09 MED ORDER — DEXAMETHASONE SODIUM PHOSPHATE 10 MG/ML IJ SOLN
INTRAMUSCULAR | Status: AC
  Filled 2023-03-09: qty 1

## 2023-03-09 MED ORDER — ACETAMINOPHEN 10 MG/ML IV SOLN
1000.0000 mg | Freq: Once | INTRAVENOUS | Status: DC | PRN
  Administered 2023-03-09: 1000 mg via INTRAVENOUS

## 2023-03-09 MED ORDER — FENTANYL CITRATE PF 50 MCG/ML IJ SOSY
PREFILLED_SYRINGE | INTRAMUSCULAR | Status: AC
  Filled 2023-03-09: qty 1

## 2023-03-09 MED ORDER — DROPERIDOL 2.5 MG/ML IJ SOLN: 0.6250 mg | Freq: Once | INTRAMUSCULAR | Status: DC | PRN

## 2023-03-09 MED ORDER — ORAL CARE MOUTH RINSE: 15.0000 mL | Freq: Once | OROMUCOSAL | Status: AC

## 2023-03-09 MED ORDER — ROCURONIUM BROMIDE 100 MG/10ML IV SOLN
INTRAVENOUS | Status: DC | PRN
  Administered 2023-03-09: 70 mg via INTRAVENOUS

## 2023-03-09 MED ORDER — ACETAMINOPHEN 10 MG/ML IV SOLN
INTRAVENOUS | Status: AC
  Filled 2023-03-09: qty 100

## 2023-03-09 MED ORDER — CIPROFLOXACIN IN D5W 400 MG/200ML IV SOLN
400.0000 mg | INTRAVENOUS | Status: AC
  Administered 2023-03-09: 400 mg via INTRAVENOUS
  Filled 2023-03-09: qty 200

## 2023-03-09 MED ORDER — BUPIVACAINE HCL (PF) 0.25 % IJ SOLN
INTRAMUSCULAR | Status: AC
  Filled 2023-03-09: qty 30

## 2023-03-09 MED ORDER — LIDOCAINE HCL (CARDIAC) PF 100 MG/5ML IV SOSY
PREFILLED_SYRINGE | INTRAVENOUS | Status: DC | PRN
  Administered 2023-03-09: 100 mg via INTRAVENOUS

## 2023-03-09 MED ORDER — SUGAMMADEX SODIUM 200 MG/2ML IV SOLN
INTRAVENOUS | Status: DC | PRN
Start: 2023-03-09 — End: 2023-03-09
  Administered 2023-03-09: 200 mg via INTRAVENOUS

## 2023-03-09 MED ORDER — DEXAMETHASONE SODIUM PHOSPHATE 4 MG/ML IJ SOLN
INTRAMUSCULAR | Status: DC | PRN
Start: 2023-03-09 — End: 2023-03-09
  Administered 2023-03-09: 8 mg via INTRAVENOUS

## 2023-03-09 MED ORDER — CHLORHEXIDINE GLUCONATE CLOTH 2 % EX PADS: 6.0000 | MEDICATED_PAD | Freq: Once | CUTANEOUS | Status: DC

## 2023-03-09 MED ORDER — HEMOSTATIC AGENTS (NO CHARGE) OPTIME
TOPICAL | Status: DC | PRN
  Administered 2023-03-09: 1 via TOPICAL

## 2023-03-09 MED ORDER — PROPOFOL 10 MG/ML IV BOLUS
INTRAVENOUS | Status: DC | PRN
  Administered 2023-03-09: 150 mg via INTRAVENOUS

## 2023-03-09 MED ORDER — OXYCODONE HCL 5 MG/5ML PO SOLN
5.0000 mg | Freq: Once | ORAL | Status: AC | PRN
  Administered 2023-03-09: 5 mg via ORAL

## 2023-03-09 MED ORDER — OXYCODONE HCL 5 MG/5ML PO SOLN
ORAL | Status: AC
  Filled 2023-03-09: qty 5

## 2023-03-09 MED ORDER — TRAMADOL HCL 50 MG PO TABS
50.0000 mg | ORAL_TABLET | Freq: Four times a day (QID) | ORAL | 0 refills | Status: DC | PRN
  Filled 2023-03-09: qty 12, 3d supply, fill #0

## 2023-03-09 MED ORDER — MIDAZOLAM HCL 2 MG/2ML IJ SOLN
INTRAMUSCULAR | Status: AC
  Filled 2023-03-09: qty 2

## 2023-03-09 MED ORDER — OXYCODONE HCL 5 MG PO TABS: 5.0000 mg | ORAL_TABLET | Freq: Once | ORAL | Status: AC | PRN

## 2023-03-09 MED ORDER — ONDANSETRON HCL 4 MG/2ML IJ SOLN
INTRAMUSCULAR | Status: DC | PRN
  Administered 2023-03-09: 4 mg via INTRAVENOUS

## 2023-03-09 MED ORDER — BUPIVACAINE-EPINEPHRINE 0.25% -1:200000 IJ SOLN
INTRAMUSCULAR | Status: AC
  Filled 2023-03-09: qty 1

## 2023-03-09 MED ORDER — ONDANSETRON HCL 4 MG/2ML IJ SOLN
INTRAMUSCULAR | Status: AC
  Filled 2023-03-09: qty 2

## 2023-03-09 MED ORDER — 0.9 % SODIUM CHLORIDE (POUR BTL) OPTIME
TOPICAL | Status: DC | PRN
  Administered 2023-03-09: 1000 mL

## 2023-03-09 MED ORDER — FENTANYL CITRATE (PF) 100 MCG/2ML IJ SOLN
INTRAMUSCULAR | Status: DC | PRN
  Administered 2023-03-09: 100 ug via INTRAVENOUS
  Administered 2023-03-09: 25 ug via INTRAVENOUS
  Administered 2023-03-09: 50 ug via INTRAVENOUS

## 2023-03-09 MED ORDER — MIDAZOLAM HCL 5 MG/5ML IJ SOLN
INTRAMUSCULAR | Status: DC | PRN
  Administered 2023-03-09: 2 mg via INTRAVENOUS

## 2023-03-09 MED ORDER — PROPOFOL 10 MG/ML IV BOLUS
INTRAVENOUS | Status: AC
  Filled 2023-03-09: qty 20

## 2023-03-09 MED ORDER — CHLORHEXIDINE GLUCONATE 0.12 % MT SOLN
15.0000 mL | Freq: Once | OROMUCOSAL | Status: AC
  Administered 2023-03-09: 15 mL via OROMUCOSAL

## 2023-03-09 MED ORDER — FENTANYL CITRATE (PF) 250 MCG/5ML IJ SOLN
INTRAMUSCULAR | Status: AC
  Filled 2023-03-09: qty 5

## 2023-03-09 MED ORDER — LIDOCAINE HCL 1 % IJ SOLN
INTRAMUSCULAR | Status: AC
  Filled 2023-03-09: qty 20

## 2023-03-09 MED ORDER — FENTANYL CITRATE PF 50 MCG/ML IJ SOSY
25.0000 ug | PREFILLED_SYRINGE | INTRAMUSCULAR | Status: DC | PRN
  Administered 2023-03-09 (×2): 25 ug via INTRAVENOUS

## 2023-03-09 MED ORDER — ROCURONIUM BROMIDE 10 MG/ML (PF) SYRINGE
PREFILLED_SYRINGE | INTRAVENOUS | Status: AC
  Filled 2023-03-09: qty 10

## 2023-03-09 MED ORDER — LACTATED RINGERS IV SOLN: INTRAVENOUS | Status: DC

## 2023-03-09 MED ORDER — BUPIVACAINE HCL 0.25 % IJ SOLN
INTRAMUSCULAR | Status: DC | PRN
  Administered 2023-03-09: 9 mL

## 2023-03-09 SURGICAL SUPPLY — 35 items
ADH SKN CLS APL DERMABOND .7 (GAUZE/BANDAGES/DRESSINGS) ×1
APL PRP STRL LF DISP 70% ISPRP (MISCELLANEOUS) ×1
ATTRACTOMAT 16X20 MAGNETIC DRP (DRAPES) ×1 IMPLANT
BAG COUNTER SPONGE SURGICOUNT (BAG) ×1 IMPLANT
BAG SPNG CNTER NS LX DISP (BAG) ×1
BLADE SURG 15 STRL LF DISP TIS (BLADE) ×1 IMPLANT
BLADE SURG 15 STRL SS (BLADE) ×1
CHLORAPREP W/TINT 26 (MISCELLANEOUS) ×1 IMPLANT
CLIP TI MEDIUM 6 (CLIP) ×2 IMPLANT
CLIP TI WIDE RED SMALL 6 (CLIP) ×2 IMPLANT
COVER SURGICAL LIGHT HANDLE (MISCELLANEOUS) ×1 IMPLANT
DERMABOND ADVANCED .7 DNX12 (GAUZE/BANDAGES/DRESSINGS) ×1 IMPLANT
DRAPE LAPAROTOMY T 98X78 PEDS (DRAPES) ×1 IMPLANT
DRAPE UTILITY XL STRL (DRAPES) ×1 IMPLANT
ELECT REM PT RETURN 15FT ADLT (MISCELLANEOUS) ×1 IMPLANT
GAUZE 4X4 16PLY ~~LOC~~+RFID DBL (SPONGE) ×1 IMPLANT
GLOVE SURG ORTHO 8.0 STRL STRW (GLOVE) ×1 IMPLANT
GOWN STRL REUS W/ TWL XL LVL3 (GOWN DISPOSABLE) ×3 IMPLANT
GOWN STRL REUS W/TWL XL LVL3 (GOWN DISPOSABLE) ×3
HEMOSTAT SURGICEL 2X4 FIBR (HEMOSTASIS) ×1 IMPLANT
ILLUMINATOR WAVEGUIDE N/F (MISCELLANEOUS) IMPLANT
KIT BASIN OR (CUSTOM PROCEDURE TRAY) ×1 IMPLANT
KIT TURNOVER KIT A (KITS) IMPLANT
NDL HYPO 22X1.5 SAFETY MO (MISCELLANEOUS) ×1 IMPLANT
NEEDLE HYPO 22X1.5 SAFETY MO (MISCELLANEOUS) ×1
PACK BASIC VI WITH GOWN DISP (CUSTOM PROCEDURE TRAY) ×1 IMPLANT
PENCIL SMOKE EVACUATOR (MISCELLANEOUS) ×1 IMPLANT
SHEARS HARMONIC 9CM CVD (BLADE) IMPLANT
SUT MNCRL AB 4-0 PS2 18 (SUTURE) ×1 IMPLANT
SUT VIC AB 3-0 SH 18 (SUTURE) ×1 IMPLANT
SYR BULB IRRIG 60ML STRL (SYRINGE) ×1 IMPLANT
SYR CONTROL 10ML LL (SYRINGE) ×1 IMPLANT
TOWEL OR 17X26 10 PK STRL BLUE (TOWEL DISPOSABLE) ×1 IMPLANT
TOWEL OR NON WOVEN STRL DISP B (DISPOSABLE) ×1 IMPLANT
TUBING CONNECTING 10 (TUBING) ×1 IMPLANT

## 2023-03-09 NOTE — Discharge Instructions (Addendum)
[facility] - Dr. Darnell Level  THYROID & PARATHYROID SURGERY:  POST-OP INSTRUCTIONS  Always review the instruction sheet provided by the hospital nurse at discharge.  A prescription for pain medication may be sent to your pharmacy at the time of discharge.  Take your pain medication as prescribed.  If narcotic pain medicine is not needed, then you may take acetaminophen (Tylenol) or ibuprofen (Advil) as needed for pain or soreness.  Take your normal home medications as prescribed unless otherwise directed.  If you need a refill on your pain medication, please contact the office during regular business hours.  Prescriptions will not be processed by the office after 5:00PM or on weekends.  Start with a light diet upon arrival home, such as soup and crackers or toast.  Be sure to drink plenty of fluids.  Resume your normal diet the day after surgery.  Most patients will experience some swelling and bruising on the chest and neck area.  Ice packs will help for the first 48 hours after arriving home.  Swelling and bruising will take several days to resolve.   It is common to experience some constipation after surgery.  Increasing fluid intake and taking a stool softener (Colace) will usually help to prevent this problem.  A mild laxative (Milk of Magnesia or Miralax) should be taken according to package directions if there has been no bowel movement after 48 hours.  Dermabond glue covers your incision. This seals the wound and you may shower at any time. The Dermabond will remain in place for about a week.  You may gradually remove the glue when it loosens around the edges.  If you need to loosen the Dermabond for removal, apply a layer of Vaseline to the wound for 15 minutes and then remove with a Kleenex. Your sutures are under the skin and will not show - they will dissolve on their own.  You may resume light daily activities beginning the day after discharge (such as self-care,  walking, climbing stairs), gradually increasing activities as tolerated. You may have sexual intercourse when it is comfortable. Refrain from any heavy lifting or straining until approved by your doctor. You may drive when you no longer are taking prescription pain medication, you can comfortably wear a seatbelt, and you can safely maneuver your car and apply the brakes.  You will see your doctor in the office for a follow-up appointment approximately three weeks after your surgery.  Make sure that you call for this appointment within a day or two after you arrive home to insure a convenient appointment time. Please have any requested laboratory tests performed a few days prior to your office visit so that the results will be available at your follow up appointment.  WHEN TO CALL THE [facility] OFFICE: -- Fever greater than 101.5 -- Inability to urinate -- Nausea and/or vomiting - persistent -- Extreme swelling or bruising -- Continued bleeding from incision -- Increased pain, redness, or drainage from the incision -- Difficulty swallowing or breathing -- Muscle cramping or spasms -- Numbness or tingling in hands or around lips  The clinic staff is available to answer your questions during regular business hours.  Please don't hesitate to call and ask to speak to one of the nurses if you have concerns.  [facility] OFFICE: [phone] (24 hours)  Please sign up for MyChart accounts. This will allow you to communicate directly with my nurse or myself without having to call the office. It will also allow you  to view your test results. You will need to enroll in MyChart for my office ([facility]) and for the hospital [facility]).  Darnell Level, MD [facility] practice

## 2023-03-09 NOTE — Op Note (Signed)
OPERATIVE REPORT - PARATHYROIDECTOMY  Preoperative diagnosis: Primary hyperparathyroidism  Postop diagnosis: Same  Procedure: Left inferior minimally invasive parathyroidectomy  Surgeon:  Darnell Level, MD  Assistant:  Reatha Harps, MD  Anesthesia: General endotracheal  Estimated blood loss: Minimal  Preparation: ChloraPrep  Indications: Patient is referred by Dr. Berniece Salines for surgical evaluation and management of suspected primary hyperparathyroidism. Patient has had a long history of nephrolithiasis. Recent laboratory studies have shown an elevated calcium level and an elevated intact PTH level. Patient was seen by her urologist and underwent a nuclear medicine parathyroid scan on Nov 06, 2022. This demonstrated persistent focal uptake at the inferior left thyroid lobe suspicious for parathyroid adenoma. 4D-CT confirmed an adenoma at the left inferior position.  Patient now comes to surgery for parathyroidectomy.  Procedure: The patient was prepared in the pre-operative holding area. The patient was brought to the operating room and placed in a supine position on the operating room table. Following administration of general anesthesia, the patient was positioned and then prepped and draped in the usual strict aseptic fashion. After ascertaining that an adequate level of anesthesia been achieved, a neck incision was made with a #15 blade. Dissection was carried through subcutaneous tissues and platysma. Hemostasis was obtained with the electrocautery. Skin flaps were developed circumferentially and a Weitlander retractor was placed for exposure.  Strap muscles were incised in the midline. Strap muscles were reflected laterally exposing the thyroid lobe. With gentle blunt dissection the thyroid lobe was mobilized.  Dissection was carried posteriorly and an enlarged parathyroid gland was identified. It was gently mobilized. Vascular structures were divided between ligaclips. Care was taken  to avoid the recurrent laryngeal nerve. The parathyroid gland was completely excised. It was submitted to pathology where frozen section confirmed hypercellular parathyroid tissue consistent with adenoma.  Neck was irrigated with warm saline and good hemostasis was noted. Fibrillar was placed in the operative field. Strap muscles were approximated in the midline with interrupted 3-0 Vicryl sutures. Platysma was closed with interrupted 3-0 Vicryl sutures. Marcaine was infiltrated circumferentially. Skin was closed with a running 4-0 Monocryl subcuticular suture. Wound was washed and dried and Dermabond was applied. Patient was awakened from anesthesia and brought to the recovery room. The patient tolerated the procedure well.   Darnell Level, MD [facility] Office: [phone]

## 2023-03-09 NOTE — Interval H&P Note (Signed)
History and Physical Interval Note:  03/09/2023 7:00 AM  Misty [name]  has presented today for surgery, with the diagnosis of PRIMARY HYPERPARATHYROIDISM.  The various methods of treatment have been discussed with the patient and family. After consideration of risks, benefits and other options for treatment, the patient has consented to    Procedure(s): LEFT PARATHYROIDECTOMY (Left) as a surgical intervention.    The patient's history has been reviewed, patient examined, no change in status, stable for surgery.  I have reviewed the patient's chart and labs.  Questions were answered to the patient's satisfaction.    Darnell Level, MD [facility] practice Office: [phone]   Darnell Level

## 2023-03-09 NOTE — Anesthesia Preprocedure Evaluation (Signed)
Anesthesia Evaluation  Patient identified by MRN, date of birth, ID band Patient awake    Reviewed: Allergy & Precautions, H&P , NPO status , Patient's Chart, lab work & pertinent test results  Airway Mallampati: II  TM Distance: >3 FB Neck ROM: Full    Dental no notable dental hx.    Pulmonary asthma    Pulmonary exam normal breath sounds clear to auscultation       Cardiovascular negative cardio ROS Normal cardiovascular exam Rhythm:Regular Rate:Normal     Neuro/Psych  Headaches PSYCHIATRIC DISORDERS Anxiety     ADHD    GI/Hepatic Neg liver ROS, hiatal hernia,GERD  ,,  Endo/Other  hyperparathyroidism  Renal/GU Renal disease  negative genitourinary   Musculoskeletal  (+) Arthritis ,    Abdominal   Peds negative pediatric ROS (+)  Hematology  (+) Blood dyscrasia, anemia   Anesthesia Other Findings Hx of right sided breast cancer  Reproductive/Obstetrics negative OB ROS                             Anesthesia Physical Anesthesia Plan  ASA: 3  Anesthesia Plan: General   Post-op Pain Management:    Induction: Intravenous  PONV Risk Score and Plan: Ondansetron and Dexamethasone  Airway Management Planned: Oral ETT  Additional Equipment:   Intra-op Plan:   Post-operative Plan: Extubation in OR  Informed Consent: I have reviewed the patients History and Physical, chart, labs and discussed the procedure including the risks, benefits and alternatives for the proposed anesthesia with the patient or authorized representative who has indicated his/her understanding and acceptance.     Dental advisory given  Plan Discussed with: CRNA  Anesthesia Plan Comments:        Anesthesia Quick Evaluation

## 2023-03-09 NOTE — Anesthesia Procedure Notes (Signed)
Procedure Name: Intubation Date/Time: 03/09/2023 7:33 AM  Performed by: Ahmed Prima, CRNAPre-anesthesia Checklist: Patient identified, Emergency Drugs available, Suction available and Patient being monitored Patient Re-evaluated:Patient Re-evaluated prior to induction Oxygen Delivery Method: Circle system utilized Preoxygenation: Pre-oxygenation with 100% oxygen Induction Type: IV induction Ventilation: Mask ventilation without difficulty Laryngoscope Size: Miller and 2 Grade View: Grade II Tube type: Oral Tube size: 7.0 mm Number of attempts: 1 Airway Equipment and Method: Stylet and Oral airway Placement Confirmation: ETT inserted through vocal cords under direct vision, positive ETCO2 and breath sounds checked- equal and bilateral Secured at: 22 (at teeth) cm Tube secured with: Tape Dental Injury: Teeth and Oropharynx as per pre-operative assessment

## 2023-03-09 NOTE — Transfer of Care (Signed)
Immediate Anesthesia Transfer of Care Note  Patient: Misty [name]  Procedure(s) Performed: LEFT PARATHYROIDECTOMY (Left: Neck)  Patient Location: PACU  Anesthesia Type:General  Level of Consciousness: drowsy and patient cooperative  Airway & Oxygen Therapy: Patient Spontanous Breathing and Patient connected to face mask oxygen  Post-op Assessment: Report given to RN and Post -op Vital signs reviewed and stable  Post vital signs: Reviewed and stable  Last Vitals:  Vitals Value Taken Time  BP 159/109 03/09/23 0830  Temp    Pulse 103 03/09/23 0831  Resp 18 03/09/23 0831  SpO2 96 % 03/09/23 0831  Vitals shown include unfiled device data.  Last Pain:  Vitals:   03/09/23 0637  TempSrc:   PainSc: 0-No pain      Patients Stated Pain Goal: 6 (03/09/23 9371)  Complications: No notable events documented.

## 2023-03-09 NOTE — Anesthesia Postprocedure Evaluation (Signed)
Anesthesia Post Note  Patient: Misty [name]  Procedure(s) Performed: LEFT PARATHYROIDECTOMY (Left: Neck)     Patient location during evaluation: PACU Anesthesia Type: General Level of consciousness: awake and alert Pain management: pain level controlled Vital Signs Assessment: post-procedure vital signs reviewed and stable Respiratory status: spontaneous breathing, nonlabored ventilation, respiratory function stable and patient connected to nasal cannula oxygen Cardiovascular status: blood pressure returned to baseline and stable Postop Assessment: no apparent nausea or vomiting Anesthetic complications: no   No notable events documented.  Last Vitals:  Vitals:   03/09/23 0915 03/09/23 0930  BP: (!) 158/99 (!) 151/97  Pulse: 74 66  Resp: (!) 7 (!) 8  Temp:  36.6 C  SpO2: 94% 92%    Last Pain:  Vitals:   03/09/23 0930  TempSrc:   PainSc: 3                  Rodriguez Hevia Nation

## 2023-03-10 ENCOUNTER — Encounter (HOSPITAL_COMMUNITY): Payer: Self-pay | Admitting: Surgery

## 2023-03-12 DIAGNOSIS — R9389 Abnormal findings on diagnostic imaging of other specified body structures: Secondary | ICD-10-CM | POA: Diagnosis not present

## 2023-03-12 DIAGNOSIS — N898 Other specified noninflammatory disorders of vagina: Secondary | ICD-10-CM | POA: Diagnosis not present

## 2023-03-12 DIAGNOSIS — Z1389 Encounter for screening for other disorder: Secondary | ICD-10-CM | POA: Diagnosis not present

## 2023-03-12 DIAGNOSIS — D251 Intramural leiomyoma of uterus: Secondary | ICD-10-CM | POA: Diagnosis not present

## 2023-03-12 DIAGNOSIS — Z01411 Encounter for gynecological examination (general) (routine) with abnormal findings: Secondary | ICD-10-CM | POA: Diagnosis not present

## 2023-03-12 DIAGNOSIS — Z130 Encounter for screening for diseases of the blood and blood-forming organs and certain disorders involving the immune mechanism: Secondary | ICD-10-CM | POA: Diagnosis not present

## 2023-03-12 DIAGNOSIS — Z853 Personal history of malignant neoplasm of breast: Secondary | ICD-10-CM | POA: Diagnosis not present

## 2023-03-12 DIAGNOSIS — N840 Polyp of corpus uteri: Secondary | ICD-10-CM | POA: Diagnosis not present

## 2023-03-12 LAB — SURGICAL PATHOLOGY

## 2023-04-02 ENCOUNTER — Ambulatory Visit
Admission: RE | Admit: 2023-04-02 | Discharge: 2023-04-02 | Disposition: A | Discharge status: Home / Self Care | Payer: Commercial Managed Care - PPO | Source: Ambulatory Visit | Attending: Adult Health | Admitting: Adult Health

## 2023-04-02 ENCOUNTER — Other Ambulatory Visit (HOSPITAL_COMMUNITY): Payer: Self-pay

## 2023-04-02 DIAGNOSIS — Z853 Personal history of malignant neoplasm of breast: Secondary | ICD-10-CM | POA: Diagnosis not present

## 2023-04-02 HISTORY — DX: Personal history of irradiation: Z92.3

## 2023-04-02 MED ORDER — ACCRUFER 30 MG PO CAPS
30.0000 mg | ORAL_CAPSULE | Freq: Two times a day (BID) | ORAL | 2 refills | Status: DC
  Filled 2023-04-02 (×2): qty 60, 30d supply, fill #0

## 2023-04-06 ENCOUNTER — Other Ambulatory Visit (HOSPITAL_COMMUNITY): Payer: Self-pay

## 2023-04-10 DIAGNOSIS — E210 Primary hyperparathyroidism: Secondary | ICD-10-CM | POA: Diagnosis not present

## 2023-04-10 DIAGNOSIS — Z9889 Other specified postprocedural states: Secondary | ICD-10-CM | POA: Diagnosis not present

## 2023-04-10 DIAGNOSIS — Z9089 Acquired absence of other organs: Secondary | ICD-10-CM | POA: Diagnosis not present

## 2023-04-12 ENCOUNTER — Other Ambulatory Visit (HOSPITAL_COMMUNITY): Payer: Self-pay

## 2023-04-13 ENCOUNTER — Other Ambulatory Visit (HOSPITAL_COMMUNITY): Payer: Self-pay

## 2023-04-13 MED ORDER — FLUCONAZOLE 150 MG PO TABS
ORAL_TABLET | ORAL | 0 refills | Status: DC
  Filled 2023-04-13: qty 2, 3d supply, fill #0

## 2023-04-14 ENCOUNTER — Other Ambulatory Visit (HOSPITAL_COMMUNITY): Payer: Self-pay

## 2023-04-17 DIAGNOSIS — R5383 Other fatigue: Secondary | ICD-10-CM | POA: Diagnosis not present

## 2023-04-17 DIAGNOSIS — R030 Elevated blood-pressure reading, without diagnosis of hypertension: Secondary | ICD-10-CM | POA: Diagnosis not present

## 2023-04-17 DIAGNOSIS — E559 Vitamin D deficiency, unspecified: Secondary | ICD-10-CM | POA: Diagnosis not present

## 2023-04-17 DIAGNOSIS — E66811 Obesity, class 1: Secondary | ICD-10-CM | POA: Diagnosis not present

## 2023-04-17 DIAGNOSIS — R102 Pelvic and perineal pain: Secondary | ICD-10-CM | POA: Diagnosis not present

## 2023-04-17 DIAGNOSIS — Z13220 Encounter for screening for lipoid disorders: Secondary | ICD-10-CM | POA: Diagnosis not present

## 2023-04-17 DIAGNOSIS — Z9089 Acquired absence of other organs: Secondary | ICD-10-CM | POA: Diagnosis not present

## 2023-04-17 DIAGNOSIS — D649 Anemia, unspecified: Secondary | ICD-10-CM | POA: Diagnosis not present

## 2023-04-17 DIAGNOSIS — Z0000 Encounter for general adult medical examination without abnormal findings: Secondary | ICD-10-CM | POA: Diagnosis not present

## 2023-04-24 ENCOUNTER — Other Ambulatory Visit (HOSPITAL_COMMUNITY): Payer: Self-pay

## 2023-04-24 MED ORDER — FLUCONAZOLE 150 MG PO TABS
150.0000 mg | ORAL_TABLET | Freq: Once | ORAL | 0 refills | Status: AC
  Filled 2023-04-24: qty 2, 2d supply, fill #0

## 2023-04-24 MED ORDER — AMOXICILLIN 500 MG PO CAPS
500.0000 mg | ORAL_CAPSULE | Freq: Three times a day (TID) | ORAL | 0 refills | Status: AC
  Filled 2023-04-24: qty 15, 5d supply, fill #0

## 2023-04-25 ENCOUNTER — Other Ambulatory Visit (HOSPITAL_COMMUNITY): Payer: Self-pay

## 2023-04-25 DIAGNOSIS — M79644 Pain in right finger(s): Secondary | ICD-10-CM | POA: Diagnosis not present

## 2023-04-25 MED ORDER — AMPHETAMINE-DEXTROAMPHETAMINE 20 MG PO TABS
20.0000 mg | ORAL_TABLET | Freq: Three times a day (TID) | ORAL | 0 refills | Status: AC
  Filled 2023-04-27: qty 270, 90d supply, fill #0

## 2023-04-25 MED ORDER — ALPRAZOLAM 1 MG PO TABS
1.0000 mg | ORAL_TABLET | Freq: Three times a day (TID) | ORAL | 0 refills | Status: AC
Start: 2023-04-26 — End: ?
  Filled 2023-04-27: qty 270, 90d supply, fill #0

## 2023-04-27 ENCOUNTER — Other Ambulatory Visit (HOSPITAL_COMMUNITY): Payer: Self-pay

## 2023-05-03 DIAGNOSIS — H524 Presbyopia: Secondary | ICD-10-CM | POA: Diagnosis not present

## 2023-05-03 DIAGNOSIS — H5203 Hypermetropia, bilateral: Secondary | ICD-10-CM | POA: Diagnosis not present

## 2023-05-09 ENCOUNTER — Other Ambulatory Visit (HOSPITAL_COMMUNITY): Payer: Self-pay

## 2023-06-01 ENCOUNTER — Other Ambulatory Visit: Payer: Self-pay

## 2023-06-01 ENCOUNTER — Other Ambulatory Visit (HOSPITAL_COMMUNITY): Payer: Self-pay

## 2023-06-01 DIAGNOSIS — F4310 Post-traumatic stress disorder, unspecified: Secondary | ICD-10-CM | POA: Diagnosis not present

## 2023-06-01 DIAGNOSIS — E669 Obesity, unspecified: Secondary | ICD-10-CM | POA: Diagnosis not present

## 2023-06-01 DIAGNOSIS — F410 Panic disorder [episodic paroxysmal anxiety] without agoraphobia: Secondary | ICD-10-CM | POA: Diagnosis not present

## 2023-06-01 DIAGNOSIS — F5101 Primary insomnia: Secondary | ICD-10-CM | POA: Diagnosis not present

## 2023-06-01 DIAGNOSIS — F900 Attention-deficit hyperactivity disorder, predominantly inattentive type: Secondary | ICD-10-CM | POA: Diagnosis not present

## 2023-06-01 MED ORDER — ZEPBOUND 2.5 MG/0.5ML ~~LOC~~ SOAJ
2.5000 mg | SUBCUTANEOUS | 0 refills | Status: DC
  Filled 2023-06-01: qty 2, 28d supply, fill #0

## 2023-06-02 ENCOUNTER — Other Ambulatory Visit (HOSPITAL_COMMUNITY): Payer: Self-pay

## 2023-06-12 ENCOUNTER — Other Ambulatory Visit (HOSPITAL_COMMUNITY): Payer: Self-pay

## 2023-06-16 ENCOUNTER — Other Ambulatory Visit (HOSPITAL_COMMUNITY): Payer: Self-pay

## 2023-06-25 ENCOUNTER — Inpatient Hospital Stay: Payer: Commercial Managed Care - PPO

## 2023-06-25 ENCOUNTER — Other Ambulatory Visit (HOSPITAL_COMMUNITY): Payer: Self-pay

## 2023-06-25 ENCOUNTER — Other Ambulatory Visit (HOSPITAL_BASED_OUTPATIENT_CLINIC_OR_DEPARTMENT_OTHER): Payer: Self-pay

## 2023-06-25 ENCOUNTER — Inpatient Hospital Stay: Payer: Commercial Managed Care - PPO | Attending: Hematology and Oncology | Admitting: Hematology and Oncology

## 2023-06-25 ENCOUNTER — Ambulatory Visit: Payer: Commercial Managed Care - PPO

## 2023-06-25 VITALS — BP 130/73 | HR 87 | Temp 97.4°F | Resp 18 | Ht 69.0 in | Wt 204.6 lb

## 2023-06-25 DIAGNOSIS — Z79810 Long term (current) use of selective estrogen receptor modulators (SERMs): Secondary | ICD-10-CM | POA: Diagnosis not present

## 2023-06-25 DIAGNOSIS — D0511 Intraductal carcinoma in situ of right breast: Secondary | ICD-10-CM | POA: Insufficient documentation

## 2023-06-25 DIAGNOSIS — Z923 Personal history of irradiation: Secondary | ICD-10-CM | POA: Insufficient documentation

## 2023-06-25 DIAGNOSIS — D500 Iron deficiency anemia secondary to blood loss (chronic): Secondary | ICD-10-CM | POA: Diagnosis not present

## 2023-06-25 LAB — CBC WITH DIFFERENTIAL (CANCER CENTER ONLY)
Abs Immature Granulocytes: 0.01 10*3/uL (ref 0.00–0.07)
Basophils Absolute: 0 10*3/uL (ref 0.0–0.1)
Basophils Relative: 0 %
Eosinophils Absolute: 0.1 10*3/uL (ref 0.0–0.5)
Eosinophils Relative: 2 %
HCT: 38.5 % (ref 36.0–46.0)
Hemoglobin: 12 g/dL (ref 12.0–15.0)
Immature Granulocytes: 0 %
Lymphocytes Relative: 39 %
Lymphs Abs: 1.8 10*3/uL (ref 0.7–4.0)
MCH: 20.8 pg — ABNORMAL LOW (ref 26.0–34.0)
MCHC: 31.2 g/dL (ref 30.0–36.0)
MCV: 66.6 fL — ABNORMAL LOW (ref 80.0–100.0)
Monocytes Absolute: 0.5 10*3/uL (ref 0.1–1.0)
Monocytes Relative: 10 %
Neutro Abs: 2.2 10*3/uL (ref 1.7–7.7)
Neutrophils Relative %: 49 %
Platelet Count: 234 10*3/uL (ref 150–400)
RBC: 5.78 MIL/uL — ABNORMAL HIGH (ref 3.87–5.11)
RDW: 21.9 % — ABNORMAL HIGH (ref 11.5–15.5)
Smear Review: NORMAL
WBC Count: 4.5 10*3/uL (ref 4.0–10.5)
nRBC: 0 % (ref 0.0–0.2)

## 2023-06-25 LAB — FERRITIN: Ferritin: 4 ng/mL — ABNORMAL LOW (ref 11–307)

## 2023-06-25 LAB — IRON AND IRON BINDING CAPACITY (CC-WL,HP ONLY)
Iron: 32 ug/dL (ref 28–170)
Saturation Ratios: 6 % — ABNORMAL LOW (ref 10.4–31.8)
TIBC: 522 ug/dL — ABNORMAL HIGH (ref 250–450)
UIBC: 490 ug/dL — ABNORMAL HIGH (ref 148–442)

## 2023-06-25 MED ORDER — TAMOXIFEN CITRATE 10 MG PO TABS
10.0000 mg | ORAL_TABLET | Freq: Every day | ORAL | 3 refills | Status: AC
  Filled 2023-06-25 – 2023-10-06 (×2): qty 90, 90d supply, fill #0
  Filled 2023-12-08 – 2023-12-17 (×2): qty 90, 90d supply, fill #1
  Filled 2024-05-24: qty 90, 90d supply, fill #2

## 2023-06-25 NOTE — Assessment & Plan Note (Signed)
 05/26/2021: (Dr. 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 adjuvant radiation therapy 07/27/2021-08/22/2021 2. Followed by antiestrogen therapy with tamoxifen  5 years.  I encouraged her to start the tamoxifen  at 10 mg daily 09/17/2021   Sepsis with pyelonephritis and hydronephrosis: Stent was placed in the hospital.    Tamoxifen  toxicities: 1.  Fatigue issues: I instructed her to take tamoxifen  at bedtime. Denies any worsening of hot flashes or arthralgias or myalgias. She has pre-existing mood swings but have not gotten any worse.   Breast cancer surveillance:  04/02/2023 mammograms: Benign breast density category C Breast exam 06/25/2023 benign   Primary hyperparathyroidism: Status post left parathyroidectomy: Adenoma Return to clinic in 1 year for follow-up

## 2023-06-25 NOTE — Progress Notes (Signed)
 Patient Care Team: [facility] as PCP - General Vanderbilt Ned, MD as Consulting Physician (General Surgery) Dewey Rush, MD as Consulting Physician (Radiation Oncology) Odean Potts, MD as Consulting Physician (Hematology and Oncology)  DIAGNOSIS:  Encounter Diagnoses  Name Primary?   Ductal carcinoma in situ (DCIS) of right breast Yes   Iron  deficiency anemia due to chronic blood loss     SUMMARY OF ONCOLOGIC HISTORY: Oncology History  Ductal carcinoma in situ (DCIS) of right breast  04/28/2021 Initial Diagnosis   Screening mammogram detected right breast mass at 4 o'clock position 10 cm from the nipple measuring 2.8 x 1.5 x 3.3 cm, axilla negative, biopsy revealed low to intermediate grade DCIS involving papillary lesion, ER/PR positive   05/26/2021 Surgery   (Dr. Vanderbilt): Right lumpectomy: Intermediate grade DCIS involving a papillary lesion 5.2 cm, no evidence of invasive cancer, resection margins are negative, ER 100%, PR 100%   06/17/2021 Genetic Testing   The [employer] CustomNext Panel found no pathogenic mutations.    The CustomNext-Cancer+RNAinsight panel offered by [employer] includes sequencing and rearrangement analysis for the following 47 genes:  APC, ATM, AXIN2, BARD1, BMPR1A, BRCA1, BRCA2, BRIP1, CDH1, CDK4, CDKN2A, CHEK2, CTNNA1, DICER1, EPCAM, GREM1, HOXB13, KIT, MEN1, MLH1, MSH2, MSH3, MSH6, MUTYH, NBN, NF1, NTHL1, PALB2, PDGFRA, PMS2, POLD1, POLE, PTEN, RAD50, RAD51C, RAD51D, SDHA, SDHB, SDHC, SDHD, SMAD4, SMARCA4, STK11, TP53, TSC1, TSC2, and VHL.  RNA data is routinely analyzed for use in variant interpretation for all genes.   07/05/2021 - 08/22/2021 Radiation Therapy   Site Technique Total Dose (Gy) Dose per Fx (Gy) Completed Fx Beam Energies  Breast, Right: Breast_R 3D 50.4/50.4 1.8 28/28 6XFFF  Breast, Right: Breast_R_Bst 3D 10/10 2 5/5 6X, 10X     09/17/2021 -  Anti-estrogen oral therapy   10 mg Tamoxifen  daily   11/29/2021 Cancer  Staging   Staging form: Breast, AJCC 8th Edition - Clinical: Stage 0 (cTis (DCIS), cN0, cM0) - Signed by Crawford Morna Pickle, NP on 11/29/2021 Stage prefix: Initial diagnosis     CHIEF COMPLIANT: Follow-up of anemia and DCIS  HISTORY OF PRESENT ILLNESS:   History of Present Illness   The patient, with a history of breast cancer and fibroids, presents for follow-up. She reports a recent change in employment and has completed a Scientist, Forensic (CMA) program. She has been experiencing issues with hypertension, which has improved since the removal of a 'bad, bad.' Her blood pressure today was 130/73. She also reports a history of low hemoglobin, which was 8.7 at the last check, and has been taking Effexor for this. She reports that she no longer feels tired and has stopped taking the Effexor due to its cost. She also reports having fibroids and has been taking medication for this as well.         ALLERGIES:  is allergic to dilaudid  [hydromorphone  hcl], ceftriaxone sodium, and latex.  MEDICATIONS:  Current Outpatient Medications  Medication Sig Dispense Refill   nystatin  powder Apply 1 Application topically 3 (three) times daily.     ALPRAZolam  (XANAX ) 1 MG tablet Take 1 tablet (1 mg total) by mouth 3 (three) times daily. 270 tablet 0   amphetamine -dextroamphetamine  (ADDERALL) 20 MG tablet Take 1 tablet (20 mg total) by mouth 3 (three) times daily. 270 tablet 0   Ferric Maltol  (ACCRUFER ) 30 MG CAPS Take 1 capsule (30 mg total) by mouth 2 (two) times daily on an empty stomach (Patient not taking: Reported on 06/25/2023) 60 capsule  2   montelukast  (SINGULAIR ) 10 MG tablet Take 1 tablet (10 mg total) by mouth every evening. 90 tablet 0   omeprazole  (PRILOSEC) 40 MG capsule Take 1 capsule (40 mg total) by mouth every morning 30 minutes before a meal 90 capsule 3   ondansetron  (ZOFRAN -ODT) 4 MG disintegrating tablet Dissolve 1 tablet (4 mg total) in the mouth every 6 (six) hours as  needed for nausea 30 tablet 1   ondansetron  (ZOFRAN -ODT) 8 MG disintegrating tablet Dissolve 1 tablet by mouth every 6 hours as needed for nausea 30 tablet 1   oxyCODONE -acetaminophen  (PERCOCET/ROXICET) 5-325 MG tablet Take 1 tablet by mouth every 6 hours as needed for severe pain (Patient not taking: Reported on 02/23/2023) 20 tablet 0   SUMAtriptan  (IMITREX ) 50 MG tablet Take 1 tablet by mouth at onset of headache followed by 1 tablet 2 hours later if symptoms persist 9 tablet 0   tamoxifen  (NOLVADEX ) 10 MG tablet Take 1 tablet (10 mg total) by mouth daily. 90 tablet 3   tirzepatide  (ZEPBOUND ) 2.5 MG/0.5ML Pen Inject 2.5 mg into the skin once a week. 2 mL 0   traMADol  (ULTRAM ) 50 MG tablet Take 1 tablet (50 mg total) by mouth every 6 (six) hours as needed for moderate pain. 12 tablet 0   No current facility-administered medications for this visit.    PHYSICAL EXAMINATION: ECOG PERFORMANCE STATUS: 1 - Symptomatic but completely ambulatory  Vitals:   06/25/23 0954  BP: 130/73  Pulse: 87  Resp: 18  Temp: (!) 97.4 F (36.3 C)  SpO2: 100%   Filed Weights   06/25/23 0954  Weight: 204 lb 9.6 oz (92.8 kg)    Physical Exam   VITALS: BP- 130/73 BREAST: Examination reveals no abnormalities.      (exam performed in the presence of a chaperone)  LABORATORY DATA:  I have reviewed the data as listed    Latest Ref Rng & Units 08/11/2022    6:33 AM 05/16/2022    6:30 PM 07/28/2021    7:26 AM  CMP  Glucose 70 - 99 mg/dL 893  90  864   BUN 6 - 20 mg/dL 10  8  22    Creatinine 0.44 - 1.00 mg/dL 9.09  9.17  9.20   Sodium 135 - 145 mmol/L 139  138  136   Potassium 3.5 - 5.1 mmol/L 3.3  3.6  3.2   Chloride 98 - 111 mmol/L 105  110  107   CO2 22 - 32 mmol/L  22  23   Calcium 8.9 - 10.3 mg/dL  89.6  89.9     Lab Results  Component Value Date   WBC 4.5 06/25/2023   HGB 12.0 06/25/2023   HCT 38.5 06/25/2023   MCV 66.6 (L) 06/25/2023   PLT 234 06/25/2023   NEUTROABS 2.2 06/25/2023     ASSESSMENT & PLAN:  Ductal carcinoma in situ (DCIS) of right breast 05/26/2021: (Dr. 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 adjuvant radiation therapy 07/27/2021-08/22/2021 2. Followed by antiestrogen therapy with tamoxifen  5 years.  I encouraged her to start the tamoxifen  at 10 mg daily 09/17/2021   Sepsis with pyelonephritis and hydronephrosis: Stent was placed in the hospital.    Tamoxifen  toxicities: 1.  Fatigue issues: I instructed her to take tamoxifen  at bedtime. Denies any worsening of hot flashes or arthralgias or myalgias.  She has pre-existing mood swings but have not gotten any worse.   Breast cancer surveillance:  04/02/2023 mammograms: Benign breast density category C Breast exam 06/25/2023 benign   Primary hyperparathyroidism: Status post left parathyroidectomy: Adenoma Return to clinic in 1 year for follow-up ------------------------------------- Assessment and Plan    Iron  Deficiency Anemia Previously low hemoglobin (8.7) with symptoms of fatigue, now improved. Patient has been taking iron  supplements (Effexor) but unsure if anemia has resolved. Today's hemoglobin is 12 but iron  studies showed iron  saturation 6% and a ferritin of 4: I left a voicemail for the patient to call us  back.  The ferritin being at 4 would be an indication for IV iron  but if she would like to continue the oral iron  regimen for 3 more months and recheck it that would be reasonable as well.  We will await her phone call to make a final decision regarding the anemia management.    Fibroids Patient reports having fibroids, but no current symptoms or issues discussed.           Orders Placed This Encounter  Procedures   CBC with Differential (Cancer Center Only)    Standing Status:   Future    Number of Occurrences:   1    Expiration Date:    06/24/2024   Ferritin    Standing Status:   Future    Number of Occurrences:   1    Expiration Date:   06/24/2024   Iron  and Iron  Binding Capacity ([facility] only)    Standing Status:   Future    Number of Occurrences:   1    Expiration Date:   06/24/2024   The patient has a good understanding of the overall plan. she agrees with it. she will call with any problems that may develop before the next visit here. Total time spent: 30 mins including face to face time and time spent for planning, charting and co-ordination of care   Misty [name] MARLA Chad, MD 06/25/23

## 2023-06-26 ENCOUNTER — Other Ambulatory Visit: Payer: Self-pay | Admitting: *Deleted

## 2023-06-26 ENCOUNTER — Telehealth: Payer: Self-pay | Admitting: Pharmacy Technician

## 2023-06-26 DIAGNOSIS — D649 Anemia, unspecified: Secondary | ICD-10-CM | POA: Insufficient documentation

## 2023-06-26 DIAGNOSIS — D508 Other iron deficiency anemias: Secondary | ICD-10-CM

## 2023-06-26 NOTE — Telephone Encounter (Signed)
 Dr. Gudena, [facility] note:  patient will be scheduled as soon as possible.  Auth Submission: NO AUTH NEEDED Site of care: Site of care: [facility] Payer: [employer] Medication & CPT/J Code(s) submitted: Venofer  (Iron  Sucrose) J1756 Route of submission (phone, fax, portal):  Phone # Fax # Auth type: Buy/Bill PB Units/visits requested: 3 DOSES Reference number:  Approval from: 06/26/23 to 10/24/23

## 2023-06-26 NOTE — Progress Notes (Signed)
 Verbal orders received from MD for pt to receive Venofer 300 mg weekly x 3 for [employer] location.  Pt educated and verbalized understanding.

## 2023-07-05 ENCOUNTER — Ambulatory Visit: Payer: Commercial Managed Care - PPO

## 2023-07-07 ENCOUNTER — Other Ambulatory Visit (HOSPITAL_COMMUNITY): Payer: Self-pay

## 2023-07-10 ENCOUNTER — Ambulatory Visit: Payer: Commercial Managed Care - PPO

## 2023-07-10 VITALS — BP 155/96 | HR 71 | Temp 98.0°F | Resp 18 | Ht 67.0 in | Wt 207.2 lb

## 2023-07-10 DIAGNOSIS — D509 Iron deficiency anemia, unspecified: Secondary | ICD-10-CM | POA: Diagnosis not present

## 2023-07-10 DIAGNOSIS — D508 Other iron deficiency anemias: Secondary | ICD-10-CM

## 2023-07-10 MED ORDER — SODIUM CHLORIDE 0.9 % IV SOLN
300.0000 mg | Freq: Once | INTRAVENOUS | Status: AC
  Administered 2023-07-10: 300 mg via INTRAVENOUS
  Filled 2023-07-10: qty 15

## 2023-07-10 MED ORDER — IRON SUCROSE 300 MG IVPB - SIMPLE MED
300.0000 mg | Freq: Once | Status: DC
Start: 2023-07-10 — End: 2023-07-10

## 2023-07-10 MED ORDER — DIPHENHYDRAMINE HCL 25 MG PO CAPS
25.0000 mg | ORAL_CAPSULE | Freq: Once | ORAL | Status: AC
  Administered 2023-07-10: 25 mg via ORAL

## 2023-07-10 MED ORDER — ACETAMINOPHEN 325 MG PO TABS
650.0000 mg | ORAL_TABLET | Freq: Once | ORAL | Status: AC
  Administered 2023-07-10: 650 mg via ORAL

## 2023-07-10 NOTE — Patient Instructions (Signed)

## 2023-07-10 NOTE — Progress Notes (Signed)
Diagnosis: Iron Deficiency Anemia  Provider:  Chilton Greathouse MD  Procedure: IV Infusion  IV Type: Peripheral, IV Location: L Antecubital  Venofer (Iron Sucrose), Dose: 300 mg  Infusion Start Time: 0848  Infusion Stop Time: 1035  Post Infusion IV Care: Observation period completed and Peripheral IV Discontinued  Discharge: Condition: Good, Destination: Home . AVS Provided  Performed by:  Adriana Mccallum, RN

## 2023-07-11 ENCOUNTER — Other Ambulatory Visit (HOSPITAL_COMMUNITY): Payer: Self-pay

## 2023-07-12 ENCOUNTER — Ambulatory Visit: Payer: Commercial Managed Care - PPO

## 2023-07-17 ENCOUNTER — Ambulatory Visit: Payer: Commercial Managed Care - PPO

## 2023-07-17 VITALS — BP 164/99 | HR 84 | Temp 98.0°F | Resp 18 | Ht 67.5 in | Wt 208.8 lb

## 2023-07-17 DIAGNOSIS — D508 Other iron deficiency anemias: Secondary | ICD-10-CM

## 2023-07-17 DIAGNOSIS — D509 Iron deficiency anemia, unspecified: Secondary | ICD-10-CM | POA: Diagnosis not present

## 2023-07-17 MED ORDER — ACETAMINOPHEN 325 MG PO TABS
650.0000 mg | ORAL_TABLET | Freq: Once | ORAL | Status: AC
  Administered 2023-07-17: 650 mg via ORAL
  Filled 2023-07-17: qty 2

## 2023-07-17 MED ORDER — IRON SUCROSE 300 MG IVPB - SIMPLE MED: 300.0000 mg | Freq: Once | Status: DC

## 2023-07-17 MED ORDER — SODIUM CHLORIDE 0.9 % IV SOLN
300.0000 mg | Freq: Once | INTRAVENOUS | Status: AC
  Administered 2023-07-17: 300 mg via INTRAVENOUS
  Filled 2023-07-17: qty 15

## 2023-07-17 MED ORDER — DIPHENHYDRAMINE HCL 25 MG PO CAPS
25.0000 mg | ORAL_CAPSULE | Freq: Once | ORAL | Status: AC
  Administered 2023-07-17: 25 mg via ORAL
  Filled 2023-07-17: qty 1

## 2023-07-17 NOTE — Progress Notes (Signed)
Diagnosis: Iron Deficiency Anemia  Provider:  Chilton Greathouse MD  Procedure: IV Infusion  IV Type: Peripheral, IV Location: L Antecubital  Venofer (Iron Sucrose), Dose: 300 mg  Infusion Start Time: 0854  Infusion Stop Time: 1044  Post Infusion IV Care: Observation period completed and Peripheral IV Discontinued  Discharge: Condition: Good, Destination: Home . AVS Provided  Performed by:  Adriana Mccallum, RN

## 2023-07-19 ENCOUNTER — Other Ambulatory Visit (HOSPITAL_COMMUNITY): Payer: Self-pay

## 2023-07-19 ENCOUNTER — Ambulatory Visit: Payer: Commercial Managed Care - PPO

## 2023-07-19 MED ORDER — MONTELUKAST SODIUM 10 MG PO TABS
10.0000 mg | ORAL_TABLET | Freq: Every evening | ORAL | 1 refills | Status: DC
  Filled 2023-07-19 – 2023-07-21 (×2): qty 90, 90d supply, fill #0
  Filled 2023-08-18: qty 30, 30d supply, fill #1
  Filled 2023-12-17: qty 30, 30d supply, fill #2
  Filled 2024-02-20: qty 30, 30d supply, fill #3

## 2023-07-19 MED ORDER — ALBUTEROL SULFATE HFA 108 (90 BASE) MCG/ACT IN AERS
1.0000 | INHALATION_SPRAY | RESPIRATORY_TRACT | 0 refills | Status: AC | PRN
  Filled 2023-07-19: qty 13.4, 67d supply, fill #0
  Filled 2023-07-21: qty 13.4, 30d supply, fill #0

## 2023-07-21 ENCOUNTER — Other Ambulatory Visit (HOSPITAL_COMMUNITY): Payer: Self-pay

## 2023-07-24 ENCOUNTER — Ambulatory Visit: Payer: Commercial Managed Care - PPO

## 2023-07-24 ENCOUNTER — Other Ambulatory Visit (HOSPITAL_COMMUNITY): Payer: Self-pay

## 2023-07-24 VITALS — BP 130/82 | HR 73 | Temp 98.3°F | Resp 18 | Ht 67.0 in | Wt 205.8 lb

## 2023-07-24 DIAGNOSIS — D509 Iron deficiency anemia, unspecified: Secondary | ICD-10-CM

## 2023-07-24 DIAGNOSIS — D508 Other iron deficiency anemias: Secondary | ICD-10-CM

## 2023-07-24 MED ORDER — AMPHETAMINE-DEXTROAMPHETAMINE 20 MG PO TABS
20.0000 mg | ORAL_TABLET | Freq: Three times a day (TID) | ORAL | 0 refills | Status: DC
  Filled 2023-07-24: qty 270, 90d supply, fill #0
  Filled 2023-07-27: qty 239, 79d supply, fill #0
  Filled 2023-07-27: qty 270, 90d supply, fill #0
  Filled 2023-07-27: qty 31, 11d supply, fill #0

## 2023-07-24 MED ORDER — IRON SUCROSE 300 MG IVPB - SIMPLE MED
300.0000 mg | Freq: Once | Status: DC
Start: 2023-07-24 — End: 2023-07-24

## 2023-07-24 MED ORDER — SODIUM CHLORIDE 0.9 % IV SOLN
300.0000 mg | Freq: Once | INTRAVENOUS | Status: AC
  Administered 2023-07-24: 300 mg via INTRAVENOUS
  Filled 2023-07-24: qty 15

## 2023-07-24 MED ORDER — ALPRAZOLAM 1 MG PO TABS
ORAL_TABLET | ORAL | 0 refills | Status: DC
  Filled 2023-07-24: qty 270, 90d supply, fill #0

## 2023-07-24 MED ORDER — ACETAMINOPHEN 325 MG PO TABS
650.0000 mg | ORAL_TABLET | Freq: Once | ORAL | Status: AC
  Administered 2023-07-24: 650 mg via ORAL

## 2023-07-24 MED ORDER — DIPHENHYDRAMINE HCL 25 MG PO CAPS
25.0000 mg | ORAL_CAPSULE | Freq: Once | ORAL | Status: AC
  Administered 2023-07-24: 25 mg via ORAL

## 2023-07-24 MED ORDER — IRON SUCROSE 20 MG/ML IV SOLN
300.0000 mg | Freq: Once | INTRAVENOUS | Status: DC
  Filled 2023-07-24: qty 15

## 2023-07-24 NOTE — Progress Notes (Signed)
 Diagnosis: Iron  Deficiency Anemia  Provider:  Praveen Mannam MD  Procedure: IV Infusion  IV Type: Peripheral, IV Location: R Hand  Venofer  (Iron  Sucrose), Dose: 300 mg  Infusion Start Time: 0911  Infusion Stop Time: 1050  Post Infusion IV Care: Observation period completed and Peripheral IV Discontinued  Discharge: Condition: Good, Destination: Home . AVS Declined  Performed by:  Daysy Santini, RN

## 2023-07-25 ENCOUNTER — Ambulatory Visit: Payer: Commercial Managed Care - PPO | Admitting: Skilled Nursing Facility1

## 2023-07-25 ENCOUNTER — Other Ambulatory Visit (HOSPITAL_COMMUNITY): Payer: Self-pay

## 2023-07-27 ENCOUNTER — Other Ambulatory Visit (HOSPITAL_COMMUNITY): Payer: Self-pay

## 2023-07-27 ENCOUNTER — Other Ambulatory Visit (HOSPITAL_BASED_OUTPATIENT_CLINIC_OR_DEPARTMENT_OTHER): Payer: Self-pay

## 2023-08-06 ENCOUNTER — Inpatient Hospital Stay: Payer: Commercial Managed Care - PPO | Attending: Hematology and Oncology

## 2023-08-06 DIAGNOSIS — D0511 Intraductal carcinoma in situ of right breast: Secondary | ICD-10-CM | POA: Diagnosis not present

## 2023-08-06 DIAGNOSIS — Z79810 Long term (current) use of selective estrogen receptor modulators (SERMs): Secondary | ICD-10-CM | POA: Insufficient documentation

## 2023-08-06 DIAGNOSIS — D508 Other iron deficiency anemias: Secondary | ICD-10-CM

## 2023-08-06 DIAGNOSIS — D509 Iron deficiency anemia, unspecified: Secondary | ICD-10-CM | POA: Diagnosis not present

## 2023-08-06 LAB — CBC WITH DIFFERENTIAL (CANCER CENTER ONLY)
Abs Immature Granulocytes: 0.02 10*3/uL (ref 0.00–0.07)
Basophils Absolute: 0 10*3/uL (ref 0.0–0.1)
Basophils Relative: 1 %
Eosinophils Absolute: 0.2 10*3/uL (ref 0.0–0.5)
Eosinophils Relative: 4 %
HCT: 44.8 % (ref 36.0–46.0)
Hemoglobin: 13.3 g/dL (ref 12.0–15.0)
Immature Granulocytes: 0 %
Lymphocytes Relative: 44 %
Lymphs Abs: 2.6 10*3/uL (ref 0.7–4.0)
MCH: 22.7 pg — ABNORMAL LOW (ref 26.0–34.0)
MCHC: 29.7 g/dL — ABNORMAL LOW (ref 30.0–36.0)
MCV: 76.5 fL — ABNORMAL LOW (ref 80.0–100.0)
Monocytes Absolute: 0.5 10*3/uL (ref 0.1–1.0)
Monocytes Relative: 8 %
Neutro Abs: 2.6 10*3/uL (ref 1.7–7.7)
Neutrophils Relative %: 43 %
Platelet Count: 229 10*3/uL (ref 150–400)
RBC: 5.86 MIL/uL — ABNORMAL HIGH (ref 3.87–5.11)
RDW: 30.2 % — ABNORMAL HIGH (ref 11.5–15.5)
WBC Count: 5.9 10*3/uL (ref 4.0–10.5)
nRBC: 0 % (ref 0.0–0.2)

## 2023-08-07 LAB — IRON AND IRON BINDING CAPACITY (CC-WL,HP ONLY)
Iron: 40 ug/dL (ref 28–170)
Saturation Ratios: 10 % — ABNORMAL LOW (ref 10.4–31.8)
TIBC: 416 ug/dL (ref 250–450)
UIBC: 376 ug/dL (ref 148–442)

## 2023-08-07 LAB — FERRITIN: Ferritin: 118 ng/mL (ref 11–307)

## 2023-08-08 ENCOUNTER — Inpatient Hospital Stay (HOSPITAL_BASED_OUTPATIENT_CLINIC_OR_DEPARTMENT_OTHER): Payer: Commercial Managed Care - PPO | Admitting: Hematology and Oncology

## 2023-08-08 DIAGNOSIS — D0511 Intraductal carcinoma in situ of right breast: Secondary | ICD-10-CM | POA: Diagnosis not present

## 2023-08-08 DIAGNOSIS — D509 Iron deficiency anemia, unspecified: Secondary | ICD-10-CM | POA: Diagnosis not present

## 2023-08-08 NOTE — Assessment & Plan Note (Signed)
05/26/2021: (Dr. Luisa Hart): Right lumpectomy: Intermediate grade DCIS involving a papillary lesion 5.2 cm, no evidence of invasive cancer, resection margins are negative, ER 100%, PR 100%   Treatment plan: 1. adjuvant radiation therapy 07/27/2021-08/22/2021 2. Followed by antiestrogen therapy with tamoxifen 5 years.  I encouraged her to start the tamoxifen at 10 mg daily 09/17/2021   Sepsis with pyelonephritis and hydronephrosis: Stent was placed in the hospital.    Tamoxifen toxicities: 1.  Fatigue issues: I instructed her to take tamoxifen at bedtime. Denies any worsening of hot flashes or arthralgias or myalgias. She has pre-existing mood swings but have not gotten any worse.   Breast cancer surveillance:  04/02/2023 mammograms: Benign breast density category C Breast exam 06/25/2023 benign   Primary hyperparathyroidism: Status post left parathyroidectomy: Adenoma

## 2023-08-08 NOTE — Assessment & Plan Note (Signed)
Lab review: 06/25/2023: Hemoglobin 12, MCV 66.6, ferritin 4 08/06/2023: Hemoglobin 13.3, MCV 76.5, ferritin 118, iron saturation 10%, TIBC 416  IV iron: January 2025  Labs in 3 months and follow-up after that to discuss results

## 2023-08-08 NOTE — Progress Notes (Signed)
HEMATOLOGY-ONCOLOGY TELEPHONE VISIT PROGRESS NOTE  I connected with our patient on 08/08/23 at 10:30 AM EST by telephone and verified that I am speaking with the correct person using two identifiers.  I discussed the limitations, risks, security and privacy concerns of performing an evaluation and management service by telephone and the availability of in person appointments.  I also discussed with the patient that there may be a patient responsible charge related to this service. The patient expressed understanding and agreed to proceed.   History of Present Illness:   History of Present Illness   Misty [name] is a 48 year old female with iron deficiency anemia who presents with heart palpitations and hand tremors.  She experiences episodes of heart racing without a significant increase in heart rate, describing it as a 'jittery feeling.' Additionally, she noted that her hands were shaking badly yesterday, which she has not experienced before. She is concerned about a possible hormonal imbalance, especially considering her history of parathyroid removal.  She underwent an iron infusion in January, which improved her ferritin levels significantly. However, she continues to experience fatigue, which she attributes to her demanding work schedule, working seven days a week, including night shifts at the hospital. Despite the iron infusion, her iron saturation remains low, indicating either low intake or absorption of iron. She inquired about her high RDW, which was explained as a common finding in iron deficiency anemia due to the variation in red blood cell sizes.  She experiences persistent fatigue, which may be multifactorial, including her work schedule. Despite the iron infusion, fatigue remains unchanged, suggesting other contributing factors.        Oncology History  Ductal carcinoma in situ (DCIS) of right breast  04/28/2021 Initial Diagnosis   Screening mammogram detected right  breast mass at 4 o'clock position 10 cm from the nipple measuring 2.8 x 1.5 x 3.3 cm, axilla negative, biopsy revealed low to intermediate grade DCIS involving papillary lesion, ER/PR positive   05/26/2021 Surgery   (Dr. Luisa Hart): Right lumpectomy: Intermediate grade DCIS involving a papillary lesion 5.2 cm, no evidence of invasive cancer, resection margins are negative, ER 100%, PR 100%   06/17/2021 Genetic Testing   The [employer] CustomNext Panel found no pathogenic mutations.    The CustomNext-Cancer+RNAinsight panel offered by [employer] includes sequencing and rearrangement analysis for the following 47 genes:  APC, ATM, AXIN2, BARD1, BMPR1A, BRCA1, BRCA2, BRIP1, CDH1, CDK4, CDKN2A, CHEK2, CTNNA1, DICER1, EPCAM, GREM1, HOXB13, KIT, MEN1, MLH1, MSH2, MSH3, MSH6, MUTYH, NBN, NF1, NTHL1, PALB2, PDGFRA, PMS2, POLD1, POLE, PTEN, RAD50, RAD51C, RAD51D, SDHA, SDHB, SDHC, SDHD, SMAD4, SMARCA4, STK11, TP53, TSC1, TSC2, and VHL.  RNA data is routinely analyzed for use in variant interpretation for all genes.   07/05/2021 - 08/22/2021 Radiation Therapy   Site Technique Total Dose (Gy) Dose per Fx (Gy) Completed Fx Beam Energies  Breast, Right: Breast_R 3D 50.4/50.4 1.8 28/28 6XFFF  Breast, Right: Breast_R_Bst 3D 10/10 2 5/5 6X, 10X     09/17/2021 -  Anti-estrogen oral therapy   10 mg Tamoxifen daily   11/29/2021 Cancer Staging   Staging form: Breast, AJCC 8th Edition - Clinical: Stage 0 (cTis (DCIS), cN0, cM0) - Signed by Loa Socks, NP on 11/29/2021 Stage prefix: Initial diagnosis     REVIEW OF SYSTEMS:   Constitutional: Denies fevers, chills or abnormal weight loss All other systems were reviewed with the patient and are negative. Observations/Objective:     Assessment Plan:  Ductal carcinoma in situ (DCIS)  of right breast 05/26/2021: (Dr. Luisa Hart): Right lumpectomy: Intermediate grade DCIS involving a papillary lesion 5.2 cm, no evidence of invasive cancer, resection margins are  negative, ER 100%, PR 100%   Treatment plan: 1. adjuvant radiation therapy 07/27/2021-08/22/2021 2. Followed by antiestrogen therapy with tamoxifen 5 years.  I encouraged her to start the tamoxifen at 10 mg daily 09/17/2021   Sepsis with pyelonephritis and hydronephrosis: Stent was placed in the hospital.    Tamoxifen toxicities: 1.  Fatigue issues: I instructed her to take tamoxifen at bedtime. Denies any worsening of hot flashes or arthralgias or myalgias. She has pre-existing mood swings but have not gotten any worse.   Breast cancer surveillance:  04/02/2023 mammograms: Benign breast density category C Breast exam 06/25/2023 benign   Primary hyperparathyroidism: Status post left parathyroidectomy: Adenoma   Iron deficiency anemia Lab review: 06/25/2023: Hemoglobin 12, MCV 66.6, ferritin 4 08/06/2023: Hemoglobin 13.3, MCV 76.5, ferritin 118, iron saturation 10%, TIBC 416  IV iron: January 2025 Tremors and palpitations: I discussed with her to consult her primary care physician to get some blood work for thyroid and parathyroid hormone levels.  Labs in 6 months and follow-up after that to discuss results  I discussed the assessment and treatment plan with the patient. The patient was provided an opportunity to ask questions and all were answered. The patient agreed with the plan and demonstrated an understanding of the instructions. The patient was advised to call back or seek an in-person evaluation if the symptoms worsen or if the condition fails to improve as anticipated.   I provided 20 minutes of non-face-to-face time during this encounter.  This includes time for charting and coordination of care   Tamsen Meek, MD

## 2023-08-09 ENCOUNTER — Telehealth: Payer: Self-pay | Admitting: Hematology and Oncology

## 2023-08-09 NOTE — Telephone Encounter (Signed)
Scheduled appointments per 2/19 los. Patient is aware of the made appointments.

## 2023-08-17 DIAGNOSIS — R059 Cough, unspecified: Secondary | ICD-10-CM | POA: Diagnosis not present

## 2023-08-17 DIAGNOSIS — J019 Acute sinusitis, unspecified: Secondary | ICD-10-CM | POA: Diagnosis not present

## 2023-08-18 ENCOUNTER — Other Ambulatory Visit (HOSPITAL_COMMUNITY): Payer: Self-pay

## 2023-08-18 MED ORDER — PREDNISONE 20 MG PO TABS
40.0000 mg | ORAL_TABLET | Freq: Every day | ORAL | 0 refills | Status: AC
  Filled 2023-08-18: qty 8, 4d supply, fill #0

## 2023-08-28 ENCOUNTER — Other Ambulatory Visit (HOSPITAL_COMMUNITY): Payer: Self-pay

## 2023-08-31 ENCOUNTER — Other Ambulatory Visit (HOSPITAL_COMMUNITY): Payer: Self-pay

## 2023-08-31 DIAGNOSIS — E669 Obesity, unspecified: Secondary | ICD-10-CM | POA: Diagnosis not present

## 2023-08-31 DIAGNOSIS — R399 Unspecified symptoms and signs involving the genitourinary system: Secondary | ICD-10-CM | POA: Diagnosis not present

## 2023-08-31 DIAGNOSIS — R7303 Prediabetes: Secondary | ICD-10-CM | POA: Diagnosis not present

## 2023-08-31 DIAGNOSIS — I10 Essential (primary) hypertension: Secondary | ICD-10-CM | POA: Diagnosis not present

## 2023-08-31 MED ORDER — AMLODIPINE BESYLATE 5 MG PO TABS
5.0000 mg | ORAL_TABLET | Freq: Every day | ORAL | 0 refills | Status: DC
  Filled 2023-08-31: qty 30, 30d supply, fill #0

## 2023-09-03 ENCOUNTER — Encounter (INDEPENDENT_AMBULATORY_CARE_PROVIDER_SITE_OTHER): Payer: Self-pay

## 2023-09-10 ENCOUNTER — Encounter (INDEPENDENT_AMBULATORY_CARE_PROVIDER_SITE_OTHER): Payer: Self-pay

## 2023-10-06 ENCOUNTER — Other Ambulatory Visit (HOSPITAL_COMMUNITY): Payer: Self-pay

## 2023-10-23 ENCOUNTER — Other Ambulatory Visit (HOSPITAL_COMMUNITY): Payer: Self-pay

## 2023-10-23 MED ORDER — ALPRAZOLAM 1 MG PO TABS
1.0000 mg | ORAL_TABLET | Freq: Three times a day (TID) | ORAL | 0 refills | Status: DC
  Filled 2023-10-25: qty 270, 90d supply, fill #0

## 2023-10-24 ENCOUNTER — Other Ambulatory Visit (HOSPITAL_COMMUNITY): Payer: Self-pay

## 2023-10-25 ENCOUNTER — Other Ambulatory Visit (HOSPITAL_COMMUNITY): Payer: Self-pay

## 2023-10-26 ENCOUNTER — Other Ambulatory Visit (HOSPITAL_COMMUNITY): Payer: Self-pay

## 2023-10-26 MED ORDER — AMPHETAMINE-DEXTROAMPHETAMINE 20 MG PO TABS
20.0000 mg | ORAL_TABLET | Freq: Three times a day (TID) | ORAL | 0 refills | Status: AC
  Filled 2023-10-26: qty 270, 90d supply, fill #0

## 2023-10-27 ENCOUNTER — Other Ambulatory Visit (HOSPITAL_COMMUNITY): Payer: Self-pay

## 2023-10-27 MED ORDER — BLOOD PRESSURE MONITOR MISC
0 refills | Status: AC
Start: 2023-10-27 — End: ?
  Filled 2023-10-27 (×2): qty 1, 30d supply, fill #0

## 2023-11-14 ENCOUNTER — Other Ambulatory Visit (HOSPITAL_COMMUNITY): Payer: Self-pay

## 2023-11-14 MED ORDER — AMLODIPINE BESYLATE 5 MG PO TABS
5.0000 mg | ORAL_TABLET | Freq: Every day | ORAL | 0 refills | Status: AC
  Filled 2023-11-14: qty 30, 30d supply, fill #0

## 2023-11-19 ENCOUNTER — Other Ambulatory Visit (HOSPITAL_COMMUNITY): Payer: Self-pay

## 2023-11-19 MED ORDER — FLUCONAZOLE 150 MG PO TABS
150.0000 mg | ORAL_TABLET | ORAL | 0 refills | Status: DC | PRN
  Filled 2023-11-19: qty 2, 6d supply, fill #0

## 2023-11-23 DIAGNOSIS — F5101 Primary insomnia: Secondary | ICD-10-CM | POA: Diagnosis not present

## 2023-11-23 DIAGNOSIS — F900 Attention-deficit hyperactivity disorder, predominantly inattentive type: Secondary | ICD-10-CM | POA: Diagnosis not present

## 2023-11-23 DIAGNOSIS — F410 Panic disorder [episodic paroxysmal anxiety] without agoraphobia: Secondary | ICD-10-CM | POA: Diagnosis not present

## 2023-11-23 DIAGNOSIS — F4310 Post-traumatic stress disorder, unspecified: Secondary | ICD-10-CM | POA: Diagnosis not present

## 2023-11-27 ENCOUNTER — Other Ambulatory Visit: Payer: Self-pay

## 2023-11-27 ENCOUNTER — Other Ambulatory Visit (HOSPITAL_COMMUNITY): Payer: Self-pay

## 2023-11-27 DIAGNOSIS — F419 Anxiety disorder, unspecified: Secondary | ICD-10-CM | POA: Diagnosis not present

## 2023-11-27 DIAGNOSIS — F988 Other specified behavioral and emotional disorders with onset usually occurring in childhood and adolescence: Secondary | ICD-10-CM | POA: Diagnosis not present

## 2023-11-27 DIAGNOSIS — I10 Essential (primary) hypertension: Secondary | ICD-10-CM | POA: Diagnosis not present

## 2023-11-27 DIAGNOSIS — E669 Obesity, unspecified: Secondary | ICD-10-CM | POA: Diagnosis not present

## 2023-11-27 DIAGNOSIS — R0683 Snoring: Secondary | ICD-10-CM | POA: Diagnosis not present

## 2023-11-27 DIAGNOSIS — R002 Palpitations: Secondary | ICD-10-CM | POA: Diagnosis not present

## 2023-11-27 MED ORDER — AMLODIPINE BESYLATE 10 MG PO TABS
10.0000 mg | ORAL_TABLET | Freq: Every day | ORAL | 1 refills | Status: DC
  Filled 2023-11-27: qty 90, 90d supply, fill #0
  Filled 2024-02-20: qty 90, 90d supply, fill #1

## 2023-11-27 MED ORDER — BUSPIRONE HCL 5 MG PO TABS
5.0000 mg | ORAL_TABLET | Freq: Two times a day (BID) | ORAL | 0 refills | Status: DC
  Filled 2023-11-27: qty 60, 30d supply, fill #0

## 2023-11-29 ENCOUNTER — Ambulatory Visit: Attending: Family Medicine

## 2023-11-29 ENCOUNTER — Other Ambulatory Visit: Payer: Self-pay | Admitting: *Deleted

## 2023-11-29 DIAGNOSIS — R002 Palpitations: Secondary | ICD-10-CM

## 2023-11-29 NOTE — Progress Notes (Unsigned)
 EP to read.

## 2023-12-06 ENCOUNTER — Other Ambulatory Visit (HOSPITAL_COMMUNITY): Payer: Self-pay

## 2023-12-06 MED ORDER — ONDANSETRON 4 MG PO TBDP
4.0000 mg | ORAL_TABLET | Freq: Four times a day (QID) | ORAL | 0 refills | Status: AC
  Filled 2023-12-06: qty 30, 8d supply, fill #0

## 2023-12-07 ENCOUNTER — Other Ambulatory Visit (HOSPITAL_COMMUNITY): Payer: Self-pay

## 2023-12-08 ENCOUNTER — Other Ambulatory Visit (HOSPITAL_COMMUNITY): Payer: Self-pay

## 2023-12-10 DIAGNOSIS — F4310 Post-traumatic stress disorder, unspecified: Secondary | ICD-10-CM | POA: Diagnosis not present

## 2023-12-10 DIAGNOSIS — F5101 Primary insomnia: Secondary | ICD-10-CM | POA: Diagnosis not present

## 2023-12-10 DIAGNOSIS — F410 Panic disorder [episodic paroxysmal anxiety] without agoraphobia: Secondary | ICD-10-CM | POA: Diagnosis not present

## 2023-12-10 DIAGNOSIS — F900 Attention-deficit hyperactivity disorder, predominantly inattentive type: Secondary | ICD-10-CM | POA: Diagnosis not present

## 2023-12-17 ENCOUNTER — Other Ambulatory Visit (HOSPITAL_COMMUNITY): Payer: Self-pay

## 2023-12-17 ENCOUNTER — Other Ambulatory Visit: Payer: Self-pay | Admitting: Gastroenterology

## 2023-12-17 MED ORDER — OMEPRAZOLE 40 MG PO CPDR
40.0000 mg | DELAYED_RELEASE_CAPSULE | Freq: Every morning | ORAL | 0 refills | Status: DC
  Filled 2023-12-17: qty 90, 90d supply, fill #0

## 2023-12-28 ENCOUNTER — Other Ambulatory Visit (HOSPITAL_BASED_OUTPATIENT_CLINIC_OR_DEPARTMENT_OTHER): Payer: Self-pay

## 2023-12-28 ENCOUNTER — Other Ambulatory Visit: Payer: Self-pay

## 2023-12-28 ENCOUNTER — Other Ambulatory Visit (HOSPITAL_COMMUNITY): Payer: Self-pay

## 2023-12-28 MED ORDER — ZEPBOUND 5 MG/0.5ML ~~LOC~~ SOAJ
5.0000 mg | SUBCUTANEOUS | 0 refills | Status: AC
  Filled 2023-12-28 – 2024-03-17 (×2): qty 2, 28d supply, fill #0

## 2023-12-31 ENCOUNTER — Other Ambulatory Visit (HOSPITAL_COMMUNITY): Payer: Self-pay

## 2024-01-02 DIAGNOSIS — R002 Palpitations: Secondary | ICD-10-CM | POA: Diagnosis not present

## 2024-01-03 ENCOUNTER — Telehealth: Payer: Self-pay | Admitting: Hematology and Oncology

## 2024-01-03 DIAGNOSIS — R002 Palpitations: Secondary | ICD-10-CM

## 2024-01-03 NOTE — Telephone Encounter (Signed)
 Called to reschedule patient appointment to due provider pal  request. I talked  to patient and they are aware of the changes that was made to the upcoming appointment

## 2024-01-10 DIAGNOSIS — F419 Anxiety disorder, unspecified: Secondary | ICD-10-CM | POA: Diagnosis not present

## 2024-01-10 DIAGNOSIS — I10 Essential (primary) hypertension: Secondary | ICD-10-CM | POA: Diagnosis not present

## 2024-01-23 ENCOUNTER — Other Ambulatory Visit (HOSPITAL_COMMUNITY): Payer: Self-pay

## 2024-01-23 MED ORDER — AMPHETAMINE-DEXTROAMPHETAMINE 20 MG PO TABS
20.0000 mg | ORAL_TABLET | Freq: Three times a day (TID) | ORAL | 0 refills | Status: DC
  Filled 2024-01-24: qty 270, 90d supply, fill #0

## 2024-01-24 ENCOUNTER — Other Ambulatory Visit (HOSPITAL_COMMUNITY): Payer: Self-pay

## 2024-01-24 MED ORDER — ALPRAZOLAM 1 MG PO TABS
1.0000 mg | ORAL_TABLET | Freq: Three times a day (TID) | ORAL | 0 refills | Status: DC
  Filled 2024-01-24: qty 270, 90d supply, fill #0

## 2024-01-25 ENCOUNTER — Other Ambulatory Visit: Payer: Self-pay

## 2024-01-25 ENCOUNTER — Other Ambulatory Visit (HOSPITAL_COMMUNITY): Payer: Self-pay

## 2024-02-05 ENCOUNTER — Inpatient Hospital Stay: Payer: Commercial Managed Care - PPO | Attending: Hematology and Oncology

## 2024-02-05 DIAGNOSIS — D0511 Intraductal carcinoma in situ of right breast: Secondary | ICD-10-CM | POA: Diagnosis not present

## 2024-02-05 DIAGNOSIS — D509 Iron deficiency anemia, unspecified: Secondary | ICD-10-CM | POA: Insufficient documentation

## 2024-02-05 DIAGNOSIS — Z923 Personal history of irradiation: Secondary | ICD-10-CM | POA: Insufficient documentation

## 2024-02-05 DIAGNOSIS — Z79810 Long term (current) use of selective estrogen receptor modulators (SERMs): Secondary | ICD-10-CM | POA: Diagnosis not present

## 2024-02-05 DIAGNOSIS — Z170 Estrogen receptor positive status [ER+]: Secondary | ICD-10-CM | POA: Diagnosis not present

## 2024-02-05 LAB — CBC WITH DIFFERENTIAL (CANCER CENTER ONLY)
Abs Immature Granulocytes: 0.01 K/uL (ref 0.00–0.07)
Basophils Absolute: 0 K/uL (ref 0.0–0.1)
Basophils Relative: 0 %
Eosinophils Absolute: 0.1 K/uL (ref 0.0–0.5)
Eosinophils Relative: 1 %
HCT: 38.8 % (ref 36.0–46.0)
Hemoglobin: 12.7 g/dL (ref 12.0–15.0)
Immature Granulocytes: 0 %
Lymphocytes Relative: 37 %
Lymphs Abs: 2 K/uL (ref 0.7–4.0)
MCH: 26.5 pg (ref 26.0–34.0)
MCHC: 32.7 g/dL (ref 30.0–36.0)
MCV: 81 fL (ref 80.0–100.0)
Monocytes Absolute: 0.5 K/uL (ref 0.1–1.0)
Monocytes Relative: 10 %
Neutro Abs: 2.8 K/uL (ref 1.7–7.7)
Neutrophils Relative %: 52 %
Platelet Count: 227 K/uL (ref 150–400)
RBC: 4.79 MIL/uL (ref 3.87–5.11)
RDW: 16 % — ABNORMAL HIGH (ref 11.5–15.5)
WBC Count: 5.4 K/uL (ref 4.0–10.5)
nRBC: 0 % (ref 0.0–0.2)

## 2024-02-05 LAB — IRON AND IRON BINDING CAPACITY (CC-WL,HP ONLY)
Iron: 35 ug/dL (ref 28–170)
Saturation Ratios: 8 % — ABNORMAL LOW (ref 10.4–31.8)
TIBC: 419 ug/dL (ref 250–450)
UIBC: 384 ug/dL (ref 148–442)

## 2024-02-06 LAB — FERRITIN: Ferritin: 18 ng/mL (ref 11–307)

## 2024-02-07 ENCOUNTER — Telehealth: Payer: Commercial Managed Care - PPO | Admitting: Hematology and Oncology

## 2024-02-11 ENCOUNTER — Telehealth: Admitting: Hematology and Oncology

## 2024-02-12 ENCOUNTER — Encounter: Payer: Self-pay | Admitting: Hematology and Oncology

## 2024-02-12 ENCOUNTER — Other Ambulatory Visit: Payer: Self-pay | Admitting: Hematology and Oncology

## 2024-02-12 ENCOUNTER — Telehealth: Payer: Self-pay | Admitting: Pharmacy Technician

## 2024-02-12 ENCOUNTER — Inpatient Hospital Stay (HOSPITAL_BASED_OUTPATIENT_CLINIC_OR_DEPARTMENT_OTHER): Admitting: Hematology and Oncology

## 2024-02-12 DIAGNOSIS — Z170 Estrogen receptor positive status [ER+]: Secondary | ICD-10-CM

## 2024-02-12 DIAGNOSIS — Z79899 Other long term (current) drug therapy: Secondary | ICD-10-CM | POA: Diagnosis not present

## 2024-02-12 DIAGNOSIS — D0511 Intraductal carcinoma in situ of right breast: Secondary | ICD-10-CM

## 2024-02-12 DIAGNOSIS — D509 Iron deficiency anemia, unspecified: Secondary | ICD-10-CM | POA: Diagnosis not present

## 2024-02-12 NOTE — Telephone Encounter (Signed)
 Auth Submission: NO AUTH NEEDED Site of care: Site of care: [facility] Payer: [employer] Medication & CPT/J Code(s) submitted: Venofer  (Iron  Sucrose) J1756 Diagnosis Code: D50.9 Route of submission (phone, fax, portal):  Phone # Fax # Auth type: Buy/Bill PB Units/visits requested: 300MG  X3 DOSES Reference number:  Approval from: 02/12/24 to 05/14/24

## 2024-02-12 NOTE — Assessment & Plan Note (Signed)
 05/26/2021: (Dr. 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 adjuvant radiation therapy 07/27/2021-08/22/2021 2. Followed by antiestrogen therapy with tamoxifen  5 years.  I encouraged her to start the tamoxifen  at 10 mg daily 09/17/2021  Breast cancer surveillance:  04/02/2023 mammograms: Benign breast density category C Breast exam 06/25/2023 benign   Primary hyperparathyroidism: Status post left parathyroidectomy: Adenoma

## 2024-02-12 NOTE — Assessment & Plan Note (Signed)
 Lab review: 06/25/2023: Hemoglobin 12, MCV 66.6, ferritin 4 08/06/2023: Hemoglobin 13.3, MCV 76.5, ferritin 118, iron  saturation 10%, TIBC 416 02/05/2024: Hemoglobin 12.7, MCV 81, iron  saturation 8%, ferritin 18, TIBC 419   IV iron : January 2025 Tremors and palpitations: I discussed with her to consult her primary care physician to get some blood work for thyroid  and parathyroid  hormone levels.   Labs in 3 months and follow-up after that to discuss results

## 2024-02-12 NOTE — Progress Notes (Signed)
 HEMATOLOGY-ONCOLOGY TELEPHONE VISIT PROGRESS NOTE  I connected with our patient on 02/12/24 at 11:30 AM EDT by telephone and verified that I am speaking with the correct person using two identifiers.  I discussed the limitations, risks, security and privacy concerns of performing an evaluation and management service by telephone and the availability of in person appointments.  I also discussed with the patient that there may be a patient responsible charge related to this service. The patient expressed understanding and agreed to proceed.   History of Present Illness: Follow-up of iron  deficiency anemia  History of Present Illness Misty [name] is a 48 year old female with iron  deficiency anemia who presents with fatigue and low ferritin levels.  She experiences significant fatigue, requiring daily naps. Laboratory results indicate a hemoglobin level of 12.7 and a decrease in ferritin from 118 to 18.  She has uterine fibroids causing heavy menstrual cycles and occasional breakthrough bleeding. She also experiences bleeding during sexual activity and sometimes wakes up with blood in her pants.  She previously received three doses of Venofer  (iron  sucrose) at 300 mg each, which she tolerated well, but her iron  levels continue to decline.    Oncology History  Ductal carcinoma in situ (DCIS) of right breast  04/28/2021 Initial Diagnosis   Screening mammogram detected right breast mass at 4 o'clock position 10 cm from the nipple measuring 2.8 x 1.5 x 3.3 cm, axilla negative, biopsy revealed low to intermediate grade DCIS involving papillary lesion, ER/PR positive   05/26/2021 Surgery   (Dr. Vanderbilt): Right lumpectomy: Intermediate grade DCIS involving a papillary lesion 5.2 cm, no evidence of invasive cancer, resection margins are negative, ER 100%, PR 100%   06/17/2021 Genetic Testing   The [employer] CustomNext Panel found no pathogenic mutations.    The CustomNext-Cancer+RNAinsight panel  offered by [employer] includes sequencing and rearrangement analysis for the following 47 genes:  APC, ATM, AXIN2, BARD1, BMPR1A, BRCA1, BRCA2, BRIP1, CDH1, CDK4, CDKN2A, CHEK2, CTNNA1, DICER1, EPCAM, GREM1, HOXB13, KIT, MEN1, MLH1, MSH2, MSH3, MSH6, MUTYH, NBN, NF1, NTHL1, PALB2, PDGFRA, PMS2, POLD1, POLE, PTEN, RAD50, RAD51C, RAD51D, SDHA, SDHB, SDHC, SDHD, SMAD4, SMARCA4, STK11, TP53, TSC1, TSC2, and VHL.  RNA data is routinely analyzed for use in variant interpretation for all genes.   07/05/2021 - 08/22/2021 Radiation Therapy   Site Technique Total Dose (Gy) Dose per Fx (Gy) Completed Fx Beam Energies  Breast, Right: Breast_R 3D 50.4/50.4 1.8 28/28 6XFFF  Breast, Right: Breast_R_Bst 3D 10/10 2 5/5 6X, 10X     09/17/2021 -  Anti-estrogen oral therapy   10 mg Tamoxifen  daily   11/29/2021 Cancer Staging   Staging form: Breast, AJCC 8th Edition - Clinical: Stage 0 (cTis (DCIS), cN0, cM0) - Signed by Crawford Morna Pickle, NP on 11/29/2021 Stage prefix: Initial diagnosis     REVIEW OF SYSTEMS:   Constitutional: Denies fevers, chills or abnormal weight loss All other systems were reviewed with the patient and are negative. Observations/Objective:     Assessment Plan:  Ductal carcinoma in situ (DCIS) of right breast 05/26/2021: (Dr. 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 adjuvant radiation therapy 07/27/2021-08/22/2021 2. Followed by antiestrogen therapy with tamoxifen  5 years.  I encouraged her to start the tamoxifen  at 10 mg daily 09/17/2021  Breast cancer surveillance:  04/02/2023 mammograms: Benign breast density category C Breast exam 06/25/2023 benign   Primary  hyperparathyroidism: Status post left parathyroidectomy: Adenoma  Iron  deficiency anemia Lab review: 06/25/2023: Hemoglobin 12, MCV 66.6, ferritin 4 08/06/2023: Hemoglobin  13.3, MCV 76.5, ferritin 118, iron  saturation 10%, TIBC 416 02/05/2024: Hemoglobin 12.7, MCV 81, iron  saturation 8%, ferritin 18, TIBC 419   Probably cause of IDA: Fibroids Sees Gyn to discuss hysterectomy.  IV iron : January 2025 Tremors and palpitations: I discussed with her to consult her primary care physician to get some blood work for thyroid  and parathyroid  hormone levels.   Labs in 4 months and follow-up after that to discuss results     I discussed the assessment and treatment plan with the patient. The patient was provided an opportunity to ask questions and all were answered. The patient agreed with the plan and demonstrated an understanding of the instructions. The patient was advised to call back or seek an in-person evaluation if the symptoms worsen or if the condition fails to improve as anticipated.   I provided 20 minutes of non-face-to-face time during this encounter.  This includes time for charting and coordination of care   Naomi MARLA Chad, MD

## 2024-02-13 ENCOUNTER — Telehealth: Payer: Self-pay | Admitting: Hematology and Oncology

## 2024-02-13 NOTE — Telephone Encounter (Signed)
 left vm for pt about scheduled appt date and times

## 2024-02-20 ENCOUNTER — Other Ambulatory Visit: Payer: Self-pay

## 2024-02-20 ENCOUNTER — Other Ambulatory Visit (HOSPITAL_COMMUNITY): Payer: Self-pay

## 2024-02-20 MED ORDER — BUSPIRONE HCL 5 MG PO TABS
5.0000 mg | ORAL_TABLET | Freq: Two times a day (BID) | ORAL | 0 refills | Status: DC
  Filled 2024-02-20: qty 60, 30d supply, fill #0

## 2024-02-21 ENCOUNTER — Ambulatory Visit: Admitting: *Deleted

## 2024-02-21 VITALS — BP 120/79 | HR 78 | Temp 98.6°F | Resp 16 | Ht 69.0 in | Wt 199.6 lb

## 2024-02-21 DIAGNOSIS — D509 Iron deficiency anemia, unspecified: Secondary | ICD-10-CM

## 2024-02-21 DIAGNOSIS — D508 Other iron deficiency anemias: Secondary | ICD-10-CM

## 2024-02-21 MED ORDER — SODIUM CHLORIDE 0.9 % IV SOLN
300.0000 mg | Freq: Once | INTRAVENOUS | Status: AC
  Administered 2024-02-21: 300 mg via INTRAVENOUS
  Filled 2024-02-21: qty 15

## 2024-02-21 NOTE — Progress Notes (Signed)
 Diagnosis: Iron  Deficiency Anemia  Provider:  Mannam, Praveen MD  Procedure: IV Infusion  IV Type: Peripheral, IV Location: L Antecubital  Venofer  (Iron  Sucrose), Dose: 300 mg  Infusion Start Time: 1249  Infusion Stop Time: 1443  Post Infusion IV Care: Observation period completed. Peripheral IV Discontinued  Discharge: Condition: Good, Destination: Home . AVS Provided  Performed by:  Omer Puccinelli E, RN

## 2024-02-23 ENCOUNTER — Other Ambulatory Visit (HOSPITAL_COMMUNITY): Payer: Self-pay

## 2024-02-28 ENCOUNTER — Ambulatory Visit

## 2024-02-28 VITALS — BP 122/80 | HR 69 | Temp 98.5°F | Resp 18 | Ht 69.0 in | Wt 199.0 lb

## 2024-02-28 DIAGNOSIS — D509 Iron deficiency anemia, unspecified: Secondary | ICD-10-CM | POA: Diagnosis not present

## 2024-02-28 DIAGNOSIS — D508 Other iron deficiency anemias: Secondary | ICD-10-CM

## 2024-02-28 MED ORDER — SODIUM CHLORIDE 0.9 % IV SOLN
300.0000 mg | Freq: Once | INTRAVENOUS | Status: AC
  Administered 2024-02-28: 300 mg via INTRAVENOUS
  Filled 2024-02-28: qty 15

## 2024-02-28 NOTE — Progress Notes (Signed)
 Diagnosis: Iron  Deficiency Anemia  Provider:  Praveen Mannam MD  Procedure: IV Infusion  IV Type: Peripheral, IV Location: L Antecubital  Venofer  (Iron  Sucrose), Dose: 300 mg  Infusion Start Time: 1330  Infusion Stop Time: 1514  Post Infusion IV Care: Patient declined observation and Peripheral IV Discontinued  Discharge: Condition: Good, Destination: Home . AVS Declined  Performed by:  Reganne Messerschmidt, RN

## 2024-03-06 ENCOUNTER — Ambulatory Visit (INDEPENDENT_AMBULATORY_CARE_PROVIDER_SITE_OTHER)

## 2024-03-06 VITALS — BP 116/84 | HR 88 | Temp 98.8°F | Resp 16 | Ht 69.0 in | Wt 196.4 lb

## 2024-03-06 DIAGNOSIS — D508 Other iron deficiency anemias: Secondary | ICD-10-CM

## 2024-03-06 DIAGNOSIS — D509 Iron deficiency anemia, unspecified: Secondary | ICD-10-CM

## 2024-03-06 MED ORDER — SODIUM CHLORIDE 0.9 % IV SOLN
300.0000 mg | Freq: Once | INTRAVENOUS | Status: AC
  Administered 2024-03-06: 300 mg via INTRAVENOUS
  Filled 2024-03-06: qty 15

## 2024-03-06 NOTE — Progress Notes (Signed)
 Diagnosis: Iron  Deficiency Anemia  Provider:  Praveen Mannam MD  Procedure: IV Infusion  IV Type: Peripheral, IV Location: L Antecubital  Venofer  (Iron  Sucrose), Dose: 300 mg  Infusion Start Time: 1332  Infusion Stop Time: 1511  Post Infusion IV Care: Patient declined observation and Peripheral IV Discontinued  Discharge: Condition: Good, Destination: Home . AVS Declined  Performed by:  Maximiano JONELLE Pouch, LPN

## 2024-03-17 ENCOUNTER — Encounter (HOSPITAL_COMMUNITY): Payer: Self-pay

## 2024-03-17 ENCOUNTER — Other Ambulatory Visit (HOSPITAL_COMMUNITY): Payer: Self-pay

## 2024-03-17 ENCOUNTER — Other Ambulatory Visit: Payer: Self-pay | Admitting: Gastroenterology

## 2024-03-17 NOTE — Telephone Encounter (Signed)
 Patient needs appointment. Patient has not been seen in 2 years. Patient needs to contact office and schedule appointment.

## 2024-03-18 ENCOUNTER — Other Ambulatory Visit (HOSPITAL_COMMUNITY): Payer: Self-pay

## 2024-03-18 MED ORDER — MONTELUKAST SODIUM 10 MG PO TABS
10.0000 mg | ORAL_TABLET | Freq: Every day | ORAL | 0 refills | Status: DC
  Filled 2024-03-18: qty 90, 90d supply, fill #0

## 2024-03-18 MED ORDER — BUSPIRONE HCL 5 MG PO TABS
5.0000 mg | ORAL_TABLET | Freq: Two times a day (BID) | ORAL | 0 refills | Status: AC
  Filled 2024-03-18: qty 60, 30d supply, fill #0

## 2024-03-22 ENCOUNTER — Other Ambulatory Visit (HOSPITAL_COMMUNITY): Payer: Self-pay

## 2024-03-31 ENCOUNTER — Other Ambulatory Visit (HOSPITAL_COMMUNITY): Payer: Self-pay

## 2024-03-31 ENCOUNTER — Other Ambulatory Visit: Payer: Self-pay | Admitting: Gastroenterology

## 2024-04-03 ENCOUNTER — Other Ambulatory Visit (HOSPITAL_COMMUNITY): Payer: Self-pay

## 2024-04-03 ENCOUNTER — Encounter (HOSPITAL_COMMUNITY): Payer: Self-pay

## 2024-04-04 ENCOUNTER — Other Ambulatory Visit (HOSPITAL_COMMUNITY): Payer: Self-pay

## 2024-04-04 DIAGNOSIS — N938 Other specified abnormal uterine and vaginal bleeding: Secondary | ICD-10-CM | POA: Diagnosis not present

## 2024-04-04 DIAGNOSIS — D0511 Intraductal carcinoma in situ of right breast: Secondary | ICD-10-CM | POA: Diagnosis not present

## 2024-04-04 MED ORDER — OMEPRAZOLE 40 MG PO CPDR
40.0000 mg | DELAYED_RELEASE_CAPSULE | Freq: Every day | ORAL | 0 refills | Status: DC
  Filled 2024-04-04: qty 90, 90d supply, fill #0

## 2024-04-06 ENCOUNTER — Other Ambulatory Visit: Payer: Self-pay

## 2024-04-06 ENCOUNTER — Ambulatory Visit
Admission: EM | Admit: 2024-04-06 | Discharge: 2024-04-06 | Disposition: A | Discharge status: Home / Self Care | Attending: Family Medicine | Admitting: Family Medicine

## 2024-04-06 DIAGNOSIS — S61214A Laceration without foreign body of right ring finger without damage to nail, initial encounter: Secondary | ICD-10-CM

## 2024-04-06 NOTE — Discharge Instructions (Addendum)
 Keep area clean and dry and covered at least for the next 24 hours.  Monitor for any signs of infection which include but not limited to redness, drainage, swelling, fevers or chills and please seek reevaluation in the emergency room with these occur.  Follow-up with your PCP as needed.

## 2024-04-06 NOTE — ED Provider Notes (Signed)
 [facility]    CSN: [id number] Arrival date & time: 04/06/24  0846      History   Chief Complaint No chief complaint on file.   HPI Misty [name] is a 48 y.o. female presents for finger laceration.  Patient minutes PTA she excellently cut the end of her right fourth finger with a knife.  States she mated under some water  but had difficulty controlling the bleeding which prompted her to come in.  She denies any blood thinning medications.  No numbness tingling.  She states she is up-to-date on her tetanus.  No other concerns at this time  HPI  Past Medical History:  Diagnosis Date   ADHD (attention deficit hyperactivity disorder)    Follows with Dr. Vincente, psychiatrist.   Anemia    Arthritis    Asthma    as a child   Environmental and seasonal allergies    GAD (generalized anxiety disorder)    Follows with Dr. Vincente, psychiatrist.   GERD (gastroesophageal reflux disease)    Headache    migraines, follows w/ PCP @ [facility]   History of COVID-19 06/12/2020   result in epic;  per pt mild symptoms that resolved   History of hiatal hernia    per 02/21/22 EGD   History of kidney stones    History of radiation therapy    07-27-2021 to 08-22-2021 for righ breast cancer   Hyperparathyroidism, primary 2019   currently followed by pcp  (previously seen endocrinologist-- dr beryl ronco note in epic 08-29-2017, doctor moved away)   Personal history of radiation therapy    Plantar fasciitis 04/2020   Right Breast cancer (HCC) 04/28/2021   Follows w/ Dr. Odean.   Sepsis (HCC)    admit 07-26-2021   Skin abnormalities 08/23/2021   right breast areas from radiation top of breast, around nipple and under right breast healing well   Tenosynovitis, de Quervain    left wrist   Wears glasses    Wears partial dentures upper flipper     Patient Active Problem List   Diagnosis Date Noted   Iron  deficiency anemia 08/08/2023   Anemia 06/26/2023   Hyperparathyroidism, primary  03/07/2023   Pyelonephritis 07/28/2021   Hypokalemia 07/28/2021   Sepsis (HCC) 07/26/2021   Acute pyelonephritis 07/26/2021   Renal lesion 07/26/2021   Fibroid uterus 07/26/2021   Hydroureteronephrosis 07/26/2021   Genetic testing 06/22/2021   Family history of breast cancer 06/02/2021   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   Breast cancer (HCC) 04/28/2021   Migraine 04/28/2021   Anxiety 04/28/2021   AKI (acute kidney injury) 04/28/2021   Obesity (BMI 30-39.9)    UTI 04/10/2007   ALLERGIC RHINITIS 10/30/2006   NEPHROLITHIASIS, HX OF 10/30/2006    Past Surgical History:  Procedure Laterality Date   BREAST LUMPECTOMY Right 05/2021   BREAST LUMPECTOMY WITH RADIOACTIVE SEED LOCALIZATION Right 05/26/2021   Procedure: RIGHT BREAST LUMPECTOMY WITH RADIOACTIVE SEED LOCALIZATION;  Surgeon: Vanderbilt Ned, MD;  Location: [facility];  Service: General;  Laterality: Right;   COLONOSCOPY  05/23/2019   COLONOSCOPY WITH ESOPHAGOGASTRODUODENOSCOPY (EGD)  02/21/2022   hiatal hernia   CYSTO W/ RIGHT URETEROSCOPY  06/19/2004   CYSTO/ RIGHT URETERAL STENT PLACEMENT  07/29/2009   LARGE RIGHT RENAL CALCULUS   CYSTOLITHOLAPAXY OF LARGE BLADDER CALCULI/   RIGHT URETERAL STENT EXCHANGE  04/15/2010   CYSTOSCOPY WITH RETROGRADE PYELOGRAM, URETEROSCOPY AND STENT PLACEMENT Right 08/26/2021   Procedure: CYSTOSCOPY, URETEROSCOPY  AND STENT  EXCHANGE, EXTRACTION OF RIGHT URETERAL STONE, FLUOROSCOPIC INTERPRETATION;  Surgeon: Matilda Senior, MD;  Location: [facility];  Service: Urology;  Laterality: Right;   CYSTOSCOPY WITH STENT PLACEMENT  07/04/2012   Procedure: CYSTOSCOPY WITH STENT PLACEMENT;  Surgeon: Senior Matilda, MD;  Location[facility];  Service: Urology;  Laterality: Right;  rt dbl j stent placement    CYSTOSCOPY WITH STENT PLACEMENT Right 07/26/2021   Procedure: CYSTOSCOPY WITH STENT PLACEMENT;  Surgeon: Cam Morene ORN, MD;  Location: [facility];  Service: Urology;  Laterality: Right;   CYSTOSCOPY/RETROGRADE/URETEROSCOPY/STONE EXTRACTION WITH BASKET  07/25/2012   Procedure: CYSTOSCOPY/RETROGRADE/URETEROSCOPY/STONE EXTRACTION WITH BASKET;  Surgeon: Senior Matilda, MD;  Location: [facility];  Service: Urology;  Laterality: Right;   CYSTOSCOPY/URETEROSCOPY/HOLMIUM LASER/STENT PLACEMENT Left 08/11/2022   Procedure: CYSTOSCOPY/LEFT URETEROSCOPY/HOLMIUM LASER/LEFT RETROGRADE PYELOGRAM/LEFT STENT PLACEMENT;  Surgeon: Cam Morene ORN, MD;  Location: [facility];  Service: Urology;  Laterality: Left;   EXTRACORPOREAL SHOCK WAVE LITHOTRIPSY  08/02/2009   RIGHT   HOLMIUM LASER APPLICATION  07/25/2012   Procedure: HOLMIUM LASER APPLICATION;  Surgeon: Senior Matilda, MD;  Location: [facility];  Service: Urology;  Laterality: Right;   HOLMIUM LASER APPLICATION Right 08/26/2021   Procedure: HOLMIUM LASER APPLICATION;  Surgeon: Matilda Senior, MD;  Location: [facility];  Service: Urology;  Laterality: Right;   LAPAROSCOPIC TUBAL LIGATION Bilateral 01/17/2013   Procedure: LAPAROSCOPIC TUBAL LIGATION;  Surgeon: Debby JULIANNA Lares, MD;  Location: [facility];  Service: Gynecology;  Laterality: Bilateral;  1hr OR time    MINOR RELEASE DORSAL COMPARTMENT (DEQUERVAINS) Left 04/26/2021   Procedure: MINOR RELEASE DORSAL COMPARTMENT (DEQUERVAINS) WITH GANGLION EXCISION;  Surgeon: Beverley Evalene BIRCH, MD;  Location: [facility];  Service: Orthopedics;  Laterality: Left;   PARATHYROIDECTOMY Left 03/09/2023   Procedure: LEFT PARATHYROIDECTOMY;  Surgeon: Eletha Boas, MD;  Location: [facility];  Service: General;  Laterality: Left;    OB History     Gravida  7   Para      Term      Preterm      AB  2   Living  5      SAB  1   IAB  1   Ectopic      Multiple      Live Births  5            Home Medications    Prior to Admission  medications   Medication Sig Start Date End Date Taking? Authorizing Provider  albuterol  (VENTOLIN  HFA) 108 (90 Base) MCG/ACT inhaler Inhale 1 puff into the lungs every 4 (four) hours as needed. 07/19/23     ALPRAZolam  (XANAX ) 1 MG tablet Take 1 tablet (1 mg total) by mouth 3 (three) times daily. 04/26/23     ALPRAZolam  (XANAX ) 1 MG tablet Take 1 tablet (1 mg total) by mouth 3 (three) times daily. 07/24/23     ALPRAZolam  (XANAX ) 1 MG tablet Take 1 tablet (1 mg total) by mouth 3 (three) times daily. 01/24/24     amLODipine  (NORVASC ) 10 MG tablet Take 1 tablet (10 mg total) by mouth daily. 11/27/23     amLODipine  (NORVASC ) 5 MG tablet Take 1 tablet (5 mg total) by mouth daily. 11/14/23     amphetamine -dextroamphetamine  (ADDERALL) 20 MG tablet Take 1 tablet (20 mg total) by mouth 3 (three) times daily. 04/26/23     amphetamine -dextroamphetamine  (ADDERALL) 20 MG tablet Take 1 tablet (20 mg total)  by mouth 3 (three) times daily. 10/26/23     amphetamine -dextroamphetamine  (ADDERALL) 20 MG tablet Take 1 tablet (20 mg total) by mouth 3 (three) times daily. 01/24/24     Blood Pressure Monitor MISC use as directed 10/27/23     busPIRone  (BUSPAR ) 5 MG tablet Take 1 tablet (5 mg total) by mouth 2 (two) times daily. 03/18/24     Ferric Maltol  (ACCRUFER ) 30 MG CAPS Take 1 capsule (30 mg total) by mouth 2 (two) times daily on an empty stomach Patient not taking: Reported on 06/25/2023 04/02/23     montelukast  (SINGULAIR ) 10 MG tablet Take 1 tablet (10 mg total) by mouth every evening. 03/02/22     montelukast  (SINGULAIR ) 10 MG tablet Take 1 tablet (10 mg total) by mouth every evening. 03/18/24     nystatin  powder Apply 1 Application topically 3 (three) times daily.    [provider]  omeprazole  (PRILOSEC) 40 MG capsule Take 1 capsule (40 mg total) by mouth every morning 30 minutes before a meal 12/17/23   Mansouraty, Aloha Raddle., MD  omeprazole  (PRILOSEC) 40 MG capsule Take 1 capsule by mouth once daily 30 minutes before  morning meal 04/04/24     ondansetron  (ZOFRAN -ODT) 4 MG disintegrating tablet Dissolve 1 tablet (4 mg total) in the mouth every 6 (six) hours as needed for nausea 04/30/22   Merilee Andrea CROME, NP  ondansetron  (ZOFRAN -ODT) 4 MG disintegrating tablet Dissolve 1 tablet in mouth every 6 (six) hours as needed for nausea 12/06/23   Merilee Andrea CROME, NP  ondansetron  (ZOFRAN -ODT) 8 MG disintegrating tablet Dissolve 1 tablet by mouth every 6 hours as needed for nausea 07/20/22     oxyCODONE -acetaminophen  (PERCOCET/ROXICET) 5-325 MG tablet Take 1 tablet by mouth every 6 hours as needed for severe pain Patient not taking: Reported on 02/23/2023 07/20/22     predniSONE  (DELTASONE ) 20 MG tablet Take 2 tablets (40 mg total) by mouth daily for 4 days. 08/18/23   Ruthell Lonni FALCON, PA-C  SUMAtriptan  (IMITREX ) 50 MG tablet Take 1 tablet by mouth at onset of headache followed by 1 tablet 2 hours later if symptoms persist 06/07/21     tamoxifen  (NOLVADEX ) 10 MG tablet Take 1 tablet (10 mg total) by mouth daily. 06/25/23   Gudena, Vinay, MD  tirzepatide  (ZEPBOUND ) 2.5 MG/0.5ML Pen Inject 2.5 mg into the skin once a week. 06/01/23     tirzepatide  (ZEPBOUND ) 5 MG/0.5ML Pen Inject 5 mg into the skin once a week. 12/28/23     traMADol  (ULTRAM ) 50 MG tablet Take 1 tablet (50 mg total) by mouth every 6 (six) hours as needed for moderate pain. 03/09/23   Eletha Boas, MD    Family History Family History  Problem Relation Age of Onset   Breast cancer Maternal Aunt 68       DCIS   Esophageal cancer Maternal Uncle    Stomach cancer Neg Hx    Colon cancer Neg Hx    Pancreatic cancer Neg Hx     Social History Social History   Tobacco Use   Smoking status: Never    Passive exposure: Never   Smokeless tobacco: Never  Vaping Use   Vaping status: Never Used  Substance Use Topics   Alcohol use: Yes    Comment: occasional   Drug use: Never     Allergies   Dilaudid  [hydromorphone  hcl], Ceftriaxone sodium, and  Latex   Review of Systems Review of Systems  Skin:  Positive for wound.  Physical Exam Triage Vital Signs ED Triage Vitals  Encounter Vitals Group     BP 04/06/24 0857 126/88     Girls Systolic BP Percentile --      Girls Diastolic BP Percentile --      Boys Systolic BP Percentile --      Boys Diastolic BP Percentile --      Pulse Rate 04/06/24 0857 71     Resp 04/06/24 0857 16     Temp 04/06/24 0857 98.1 F (36.7 C)     Temp Source 04/06/24 0857 Oral     SpO2 04/06/24 0857 94 %     Weight --      Height --      Head Circumference --      Peak Flow --      Pain Score 04/06/24 0853 4     Pain Loc --      Pain Education --      Exclude from Growth Chart --    No data found.  Updated Vital Signs BP 126/88   Pulse 71   Temp 98.1 F (36.7 C) (Oral)   Resp 16   LMP 03/13/2024   SpO2 94%   Visual Acuity Right Eye Distance:   Left Eye Distance:   Bilateral Distance:    Right Eye Near:   Left Eye Near:    Bilateral Near:     Physical Exam Vitals and nursing note reviewed.  Constitutional:      General: She is not in acute distress.    Appearance: Normal appearance. She is not ill-appearing.  HENT:     Head: Normocephalic and atraumatic.  Eyes:     Pupils: Pupils are equal, round, and reactive to light.  Cardiovascular:     Rate and Rhythm: Normal rate.  Pulmonary:     Effort: Pulmonary effort is normal.  Musculoskeletal:       Hands:     Comments: There is a well-approximated superficial 0.25 cm laceration to the distal right fourth finger.  No nail involvement.  Cap refill +2.  Minimal bleeding with manipulation.  No tendon involvement.  Skin:    General: Skin is warm and dry.  Neurological:     General: No focal deficit present.     Mental Status: She is alert and oriented to person, place, and time.  Psychiatric:        Mood and Affect: Mood normal.        Behavior: Behavior normal.      UC Treatments / Results  Labs (all labs ordered  are listed, but only abnormal results are displayed) Labs Reviewed - No data to display  EKG   Radiology No results found.  Procedures Procedures (including critical care time)  Medications Ordered in UC Medications - No data to display  Initial Impression / Assessment and Plan / UC Course  I have reviewed the triage vital signs and the nursing notes.  Pertinent labs & imaging results that were available during my care of the patient were reviewed by me and considered in my medical decision making (see chart for details).     Reviewed exam and symptoms with patient.  No red flags.  Very superficial laceration with no indication for closing.  Wound was cleansed with no additional bleeding.  Dressing applied with bacitracin.  Discussed wound care/signs and symptoms of infection.  Patient states she is up-to-date on her tetanus.  ER precautions reviewed Final Clinical Impressions(s) / UC Diagnoses  Final diagnoses:  Laceration of right ring finger without foreign body without damage to nail, initial encounter     Discharge Instructions      Keep area clean and dry and covered at least for the next 24 hours.  Monitor for any signs of infection which include but not limited to redness, drainage, swelling, fevers or chills and please seek reevaluation in the emergency room with these occur.  Follow-up with your PCP as needed.     ED Prescriptions   None    PDMP not reviewed this encounter.   Loreda Myla SAUNDERS, NP 04/06/24 [phone]

## 2024-04-06 NOTE — ED Triage Notes (Signed)
 Pt was washing dishes and lacerated her right 4th finger on a knife 45 mins ago. Pt states soon as she stops putting pressure on laceration, it bleeds. The laceration is less than half an inch. Pt denies blood thinners

## 2024-04-24 ENCOUNTER — Other Ambulatory Visit: Payer: Self-pay | Admitting: Hematology and Oncology

## 2024-04-24 ENCOUNTER — Other Ambulatory Visit (HOSPITAL_COMMUNITY): Payer: Self-pay

## 2024-04-24 DIAGNOSIS — Z1151 Encounter for screening for human papillomavirus (HPV): Secondary | ICD-10-CM | POA: Diagnosis not present

## 2024-04-24 DIAGNOSIS — Z113 Encounter for screening for infections with a predominantly sexual mode of transmission: Secondary | ICD-10-CM | POA: Diagnosis not present

## 2024-04-24 DIAGNOSIS — R102 Pelvic and perineal pain unspecified side: Secondary | ICD-10-CM | POA: Diagnosis not present

## 2024-04-24 DIAGNOSIS — Z9889 Other specified postprocedural states: Secondary | ICD-10-CM

## 2024-04-24 DIAGNOSIS — Z01411 Encounter for gynecological examination (general) (routine) with abnormal findings: Secondary | ICD-10-CM | POA: Diagnosis not present

## 2024-04-24 DIAGNOSIS — Z1389 Encounter for screening for other disorder: Secondary | ICD-10-CM | POA: Diagnosis not present

## 2024-04-24 DIAGNOSIS — Z130 Encounter for screening for diseases of the blood and blood-forming organs and certain disorders involving the immune mechanism: Secondary | ICD-10-CM | POA: Diagnosis not present

## 2024-04-24 DIAGNOSIS — D219 Benign neoplasm of connective and other soft tissue, unspecified: Secondary | ICD-10-CM | POA: Diagnosis not present

## 2024-04-24 DIAGNOSIS — Z124 Encounter for screening for malignant neoplasm of cervix: Secondary | ICD-10-CM | POA: Diagnosis not present

## 2024-04-24 DIAGNOSIS — N930 Postcoital and contact bleeding: Secondary | ICD-10-CM | POA: Diagnosis not present

## 2024-04-24 MED ORDER — AMPHETAMINE-DEXTROAMPHETAMINE 20 MG PO TABS
20.0000 mg | ORAL_TABLET | Freq: Three times a day (TID) | ORAL | 0 refills | Status: DC
  Filled 2024-04-24: qty 270, 90d supply, fill #0

## 2024-04-24 MED ORDER — ALPRAZOLAM 1 MG PO TABS
1.0000 mg | ORAL_TABLET | Freq: Three times a day (TID) | ORAL | 0 refills | Status: DC
  Filled 2024-04-24: qty 270, 90d supply, fill #0

## 2024-04-25 DIAGNOSIS — Z113 Encounter for screening for infections with a predominantly sexual mode of transmission: Secondary | ICD-10-CM | POA: Diagnosis not present

## 2024-05-01 DIAGNOSIS — F320 Major depressive disorder, single episode, mild: Secondary | ICD-10-CM | POA: Diagnosis not present

## 2024-05-06 ENCOUNTER — Telehealth: Payer: Self-pay

## 2024-05-06 NOTE — Telephone Encounter (Signed)
 Notified the pt regarding her FMLA form being completed,faxed,and confirmation received. Pt verbalized understanding. Pt copy was emailed upon request.

## 2024-05-08 DIAGNOSIS — F320 Major depressive disorder, single episode, mild: Secondary | ICD-10-CM | POA: Diagnosis not present

## 2024-05-14 ENCOUNTER — Other Ambulatory Visit (HOSPITAL_COMMUNITY): Payer: Self-pay

## 2024-05-14 ENCOUNTER — Ambulatory Visit
Admission: RE | Admit: 2024-05-14 | Discharge: 2024-05-14 | Disposition: A | Discharge status: Home / Self Care | Source: Ambulatory Visit | Attending: Hematology and Oncology | Admitting: Hematology and Oncology

## 2024-05-14 DIAGNOSIS — J329 Chronic sinusitis, unspecified: Secondary | ICD-10-CM | POA: Diagnosis not present

## 2024-05-14 DIAGNOSIS — I10 Essential (primary) hypertension: Secondary | ICD-10-CM | POA: Diagnosis not present

## 2024-05-14 DIAGNOSIS — R928 Other abnormal and inconclusive findings on diagnostic imaging of breast: Secondary | ICD-10-CM | POA: Diagnosis not present

## 2024-05-14 DIAGNOSIS — Z0000 Encounter for general adult medical examination without abnormal findings: Secondary | ICD-10-CM | POA: Diagnosis not present

## 2024-05-14 DIAGNOSIS — Z9889 Other specified postprocedural states: Secondary | ICD-10-CM

## 2024-05-14 DIAGNOSIS — E559 Vitamin D deficiency, unspecified: Secondary | ICD-10-CM | POA: Diagnosis not present

## 2024-05-14 DIAGNOSIS — R5383 Other fatigue: Secondary | ICD-10-CM | POA: Diagnosis not present

## 2024-05-14 DIAGNOSIS — Z7185 Encounter for immunization safety counseling: Secondary | ICD-10-CM | POA: Diagnosis not present

## 2024-05-14 DIAGNOSIS — Z13220 Encounter for screening for lipoid disorders: Secondary | ICD-10-CM | POA: Diagnosis not present

## 2024-05-14 MED ORDER — HYDROCODONE BIT-HOMATROP MBR 5-1.5 MG/5ML PO SOLN
5.0000 mL | Freq: Three times a day (TID) | ORAL | 0 refills | Status: DC
  Filled 2024-05-14: qty 75, 5d supply, fill #0

## 2024-05-14 MED ORDER — AMOXICILLIN 875 MG PO TABS
875.0000 mg | ORAL_TABLET | Freq: Two times a day (BID) | ORAL | 0 refills | Status: DC
  Filled 2024-05-14: qty 14, 7d supply, fill #0

## 2024-05-14 MED ORDER — FLUCONAZOLE 150 MG PO TABS
150.0000 mg | ORAL_TABLET | ORAL | 0 refills | Status: DC
  Filled 2024-05-14: qty 2, 6d supply, fill #0

## 2024-05-21 ENCOUNTER — Other Ambulatory Visit (HOSPITAL_COMMUNITY): Payer: Self-pay

## 2024-05-21 MED ORDER — LOSARTAN POTASSIUM 50 MG PO TABS
50.0000 mg | ORAL_TABLET | Freq: Every day | ORAL | 0 refills | Status: AC
  Filled 2024-05-21: qty 90, 90d supply, fill #0

## 2024-05-22 DIAGNOSIS — F320 Major depressive disorder, single episode, mild: Secondary | ICD-10-CM | POA: Diagnosis not present

## 2024-05-24 ENCOUNTER — Other Ambulatory Visit (HOSPITAL_COMMUNITY): Payer: Self-pay

## 2024-05-26 ENCOUNTER — Other Ambulatory Visit (HOSPITAL_COMMUNITY): Payer: Self-pay

## 2024-05-26 MED ORDER — ALBUTEROL SULFATE HFA 108 (90 BASE) MCG/ACT IN AERS
1.0000 | INHALATION_SPRAY | RESPIRATORY_TRACT | 0 refills | Status: AC | PRN
  Filled 2024-05-26: qty 20.1, 90d supply, fill #0

## 2024-05-28 ENCOUNTER — Telehealth: Payer: Self-pay | Admitting: Hematology and Oncology

## 2024-05-28 ENCOUNTER — Emergency Department (HOSPITAL_BASED_OUTPATIENT_CLINIC_OR_DEPARTMENT_OTHER)
Admission: EM | Admit: 2024-05-28 | Discharge: 2024-05-29 | Disposition: A | Discharge status: Home / Self Care | Attending: Emergency Medicine | Admitting: Emergency Medicine

## 2024-05-28 ENCOUNTER — Other Ambulatory Visit: Payer: Self-pay

## 2024-05-28 ENCOUNTER — Encounter (HOSPITAL_BASED_OUTPATIENT_CLINIC_OR_DEPARTMENT_OTHER): Payer: Self-pay | Admitting: Emergency Medicine

## 2024-05-28 ENCOUNTER — Emergency Department (HOSPITAL_BASED_OUTPATIENT_CLINIC_OR_DEPARTMENT_OTHER)

## 2024-05-28 DIAGNOSIS — R002 Palpitations: Secondary | ICD-10-CM | POA: Diagnosis present

## 2024-05-28 DIAGNOSIS — I493 Ventricular premature depolarization: Secondary | ICD-10-CM | POA: Diagnosis not present

## 2024-05-28 DIAGNOSIS — Z853 Personal history of malignant neoplasm of breast: Secondary | ICD-10-CM | POA: Diagnosis not present

## 2024-05-28 DIAGNOSIS — E876 Hypokalemia: Secondary | ICD-10-CM | POA: Insufficient documentation

## 2024-05-28 DIAGNOSIS — Z91040 Latex allergy status: Secondary | ICD-10-CM | POA: Diagnosis not present

## 2024-05-28 LAB — CBC WITH DIFFERENTIAL/PLATELET
Abs Immature Granulocytes: 0.06 K/uL (ref 0.00–0.07)
Basophils Absolute: 0 K/uL (ref 0.0–0.1)
Basophils Relative: 0 %
Eosinophils Absolute: 0.1 K/uL (ref 0.0–0.5)
Eosinophils Relative: 1 %
HCT: 43.3 % (ref 36.0–46.0)
Hemoglobin: 14.1 g/dL (ref 12.0–15.0)
Immature Granulocytes: 1 %
Lymphocytes Relative: 32 %
Lymphs Abs: 2.3 K/uL (ref 0.7–4.0)
MCH: 28.1 pg (ref 26.0–34.0)
MCHC: 32.6 g/dL (ref 30.0–36.0)
MCV: 86.4 fL (ref 80.0–100.0)
Monocytes Absolute: 0.5 K/uL (ref 0.1–1.0)
Monocytes Relative: 7 %
Neutro Abs: 4.3 K/uL (ref 1.7–7.7)
Neutrophils Relative %: 59 %
Platelets: 254 K/uL (ref 150–400)
RBC: 5.01 MIL/uL (ref 3.87–5.11)
RDW: 15 % (ref 11.5–15.5)
WBC: 7.2 K/uL (ref 4.0–10.5)
nRBC: 0 % (ref 0.0–0.2)

## 2024-05-28 LAB — D-DIMER, QUANTITATIVE: D-Dimer, Quant: 0.7 ug{FEU}/mL — ABNORMAL HIGH (ref 0.00–0.50)

## 2024-05-28 LAB — COMPREHENSIVE METABOLIC PANEL WITH GFR
ALT: 23 U/L (ref 0–44)
AST: 19 U/L (ref 15–41)
Albumin: 4.2 g/dL (ref 3.5–5.0)
Alkaline Phosphatase: 53 U/L (ref 38–126)
Anion gap: 10 (ref 5–15)
BUN: 11 mg/dL (ref 6–20)
CO2: 28 mmol/L (ref 22–32)
Calcium: 9.4 mg/dL (ref 8.9–10.3)
Chloride: 103 mmol/L (ref 98–111)
Creatinine, Ser: 0.8 mg/dL (ref 0.44–1.00)
GFR, Estimated: >60 mL/min (ref >60)
Glucose, Bld: 105 mg/dL — ABNORMAL HIGH (ref 70–99)
Potassium: 3.2 mmol/L — ABNORMAL LOW (ref 3.5–5.1)
Sodium: 141 mmol/L (ref 135–145)
Total Bilirubin: 0.4 mg/dL (ref 0.0–1.2)
Total Protein: 6.9 g/dL (ref 6.5–8.1)

## 2024-05-28 LAB — TROPONIN T, HIGH SENSITIVITY: Troponin T High Sensitivity: <15 ng/L (ref 0–19)

## 2024-05-28 MED ORDER — POTASSIUM CHLORIDE CRYS ER 20 MEQ PO TBCR
20.0000 meq | EXTENDED_RELEASE_TABLET | Freq: Two times a day (BID) | ORAL | 0 refills | Status: DC
  Filled 2024-05-28: qty 10, 5d supply, fill #0

## 2024-05-28 MED ORDER — POTASSIUM CHLORIDE CRYS ER 20 MEQ PO TBCR
40.0000 meq | EXTENDED_RELEASE_TABLET | Freq: Once | ORAL | Status: AC
  Administered 2024-05-28: 40 meq via ORAL
  Filled 2024-05-28: qty 2

## 2024-05-28 MED ORDER — POTASSIUM CHLORIDE CRYS ER 20 MEQ PO TBCR
20.0000 meq | EXTENDED_RELEASE_TABLET | Freq: Two times a day (BID) | ORAL | 0 refills | Status: AC
  Filled 2024-05-28: qty 10, 5d supply, fill #0

## 2024-05-28 MED ORDER — IOHEXOL 350 MG/ML SOLN
75.0000 mL | Freq: Once | INTRAVENOUS | Status: AC | PRN
  Administered 2024-05-28: 75 mL via INTRAVENOUS

## 2024-05-28 NOTE — ED Provider Notes (Signed)
 [place] [facility] Provider Note   CSN: [id number] Arrival date & time: 05/28/24  2123     Patient presents with: Palpitations   Misty [name] is a 48 y.o. female.   Patient here with palpitations.  Took an EKG at work that showed PVCs.  She has been under some stress today.  Drinks a lot of caffeine.  But getting good sleep.  She has a history of breast cancer in remission on tamoxifen .  She denies any active chest pain shortness of breath weakness numbness tingling.  She does admit to some anxiety and thinks that this could be was going on.  But she wanted to get evaluated.  She denies any fever chills cough sputum production.  No abdominal pain.  No nausea vomit diarrhea.  Patient has history of kidney stones reflux generalized anxiety disorder otherwise.  The history is provided by the patient.       Prior to Admission medications   Medication Sig Start Date End Date Taking? Authorizing Provider  albuterol  (VENTOLIN  HFA) 108 (90 Base) MCG/ACT inhaler Inhale 1 puff into the lungs every 4 (four) hours as needed. 07/19/23     albuterol  (VENTOLIN  HFA) 108 (90 Base) MCG/ACT inhaler Inhale 1 puff into the lungs every 4 (four) hours as needed. 05/26/24 08/26/24    ALPRAZolam  (XANAX ) 1 MG tablet Take 1 tablet (1 mg total) by mouth 3 (three) times daily. 04/26/23     ALPRAZolam  (XANAX ) 1 MG tablet Take 1 tablet (1 mg total) by mouth 3 (three) times daily. 07/24/23     ALPRAZolam  (XANAX ) 1 MG tablet Take 1 tablet (1 mg total) by mouth 3 (three) times daily. 04/24/24     amLODipine  (NORVASC ) 10 MG tablet Take 1 tablet (10 mg total) by mouth daily. 11/27/23     amLODipine  (NORVASC ) 5 MG tablet Take 1 tablet (5 mg total) by mouth daily. 11/14/23     amoxicillin  (AMOXIL ) 875 MG tablet Take 1 tablet (875 mg total) by mouth 2 (two) times daily. 05/14/24     amphetamine -dextroamphetamine  (ADDERALL) 20 MG tablet Take 1 tablet (20 mg total) by mouth 3 (three) times  daily. 04/26/23     amphetamine -dextroamphetamine  (ADDERALL) 20 MG tablet Take 1 tablet (20 mg total) by mouth 3 (three) times daily. 10/26/23     amphetamine -dextroamphetamine  (ADDERALL) 20 MG tablet Take 1 tablet (20 mg total) by mouth 3 (three) times daily. 04/24/24     Blood Pressure Monitor MISC use as directed 10/27/23     busPIRone  (BUSPAR ) 5 MG tablet Take 1 tablet (5 mg total) by mouth 2 (two) times daily. 03/18/24     Ferric Maltol  (ACCRUFER ) 30 MG CAPS Take 1 capsule (30 mg total) by mouth 2 (two) times daily on an empty stomach Patient not taking: Reported on 06/25/2023 04/02/23     fluconazole  (DIFLUCAN ) 150 MG tablet Take 1 tablet (150 mg total) by mouth every 3 (three) days. 05/14/24     HYDROcodone  bit-homatropine (HYCODAN) 5-1.5 MG/5ML syrup Take 5 mLs by mouth every 8 (eight) hours. 05/14/24     losartan  (COZAAR ) 50 MG tablet Take 1 tablet (50 mg total) by mouth daily. 05/21/24     montelukast  (SINGULAIR ) 10 MG tablet Take 1 tablet (10 mg total) by mouth every evening. 03/02/22     montelukast  (SINGULAIR ) 10 MG tablet Take 1 tablet (10 mg total) by mouth every evening. 03/18/24     nystatin  powder Apply 1 Application topically 3 (three)  times daily.    [provider]  omeprazole  (PRILOSEC) 40 MG capsule Take 1 capsule (40 mg total) by mouth every morning 30 minutes before a meal 12/17/23   Mansouraty, Aloha Raddle., MD  omeprazole  (PRILOSEC) 40 MG capsule Take 1 capsule by mouth once daily 30 minutes before morning meal 04/04/24     ondansetron  (ZOFRAN -ODT) 4 MG disintegrating tablet Dissolve 1 tablet (4 mg total) in the mouth every 6 (six) hours as needed for nausea 04/30/22   Merilee Andrea CROME, NP  ondansetron  (ZOFRAN -ODT) 4 MG disintegrating tablet Dissolve 1 tablet in mouth every 6 (six) hours as needed for nausea 12/06/23   Merilee Andrea CROME, NP  ondansetron  (ZOFRAN -ODT) 8 MG disintegrating tablet Dissolve 1 tablet by mouth every 6 hours as needed for nausea 07/20/22      oxyCODONE -acetaminophen  (PERCOCET/ROXICET) 5-325 MG tablet Take 1 tablet by mouth every 6 hours as needed for severe pain Patient not taking: Reported on 02/23/2023 07/20/22     predniSONE  (DELTASONE ) 20 MG tablet Take 2 tablets (40 mg total) by mouth daily for 4 days. 08/18/23   Ruthell Lonni FALCON, PA-C  SUMAtriptan  (IMITREX ) 50 MG tablet Take 1 tablet by mouth at onset of headache followed by 1 tablet 2 hours later if symptoms persist 06/07/21     tamoxifen  (NOLVADEX ) 10 MG tablet Take 1 tablet (10 mg total) by mouth daily. 06/25/23   Gudena, Vinay, MD  tirzepatide  (ZEPBOUND ) 2.5 MG/0.5ML Pen Inject 2.5 mg into the skin once a week. 06/01/23     tirzepatide  (ZEPBOUND ) 5 MG/0.5ML Pen Inject 5 mg into the skin once a week. 12/28/23     traMADol  (ULTRAM ) 50 MG tablet Take 1 tablet (50 mg total) by mouth every 6 (six) hours as needed for moderate pain. 03/09/23   Eletha Boas, MD    Allergies: Dilaudid  [hydromorphone  hcl], Ceftriaxone sodium, and Latex    Review of Systems  Updated Vital Signs BP (!) 150/99 (BP Location: Right Arm)   Pulse 74   Temp 98 F (36.7 C)   Resp 18   Ht 5' 9 (1.753 m)   Wt 88.5 kg   SpO2 100%   BMI 28.80 kg/m   Physical Exam Vitals and nursing note reviewed.  Constitutional:      General: She is not in acute distress.    Appearance: She is well-developed. She is not ill-appearing.  HENT:     Head: Normocephalic and atraumatic.     Nose: Nose normal.     Mouth/Throat:     Mouth: Mucous membranes are moist.     Pharynx: Oropharynx is clear.  Eyes:     Extraocular Movements: Extraocular movements intact.     Conjunctiva/sclera: Conjunctivae normal.     Pupils: Pupils are equal, round, and reactive to light.  Cardiovascular:     Rate and Rhythm: Normal rate and regular rhythm.     Pulses: Normal pulses.     Heart sounds: Normal heart sounds. No murmur heard. Pulmonary:     Effort: Pulmonary effort is normal. No respiratory distress.     Breath sounds:  Normal breath sounds.  Abdominal:     Palpations: Abdomen is soft.     Tenderness: There is no abdominal tenderness.  Musculoskeletal:        General: No swelling.     Cervical back: Normal range of motion and neck supple.  Skin:    General: Skin is warm and dry.     Capillary Refill: Capillary refill takes  less than 2 seconds.  Neurological:     General: No focal deficit present.     Mental Status: She is alert and oriented to person, place, and time.     Cranial Nerves: No cranial nerve deficit.     Sensory: No sensory deficit.     Motor: No weakness.     Coordination: Coordination normal.     Comments: 5+ out of 5 strength throughout, normal sensation,  Psychiatric:        Mood and Affect: Mood normal.     (all labs ordered are listed, but only abnormal results are displayed) Labs Reviewed  COMPREHENSIVE METABOLIC PANEL WITH GFR - Abnormal; Notable for the following components:      Result Value   Potassium 3.2 (*)    Glucose, Bld 105 (*)    All other components within normal limits  D-DIMER, QUANTITATIVE - Abnormal; Notable for the following components:   D-Dimer, Quant 0.70 (*)    All other components within normal limits  CBC WITH DIFFERENTIAL/PLATELET  TROPONIN T, HIGH SENSITIVITY  TROPONIN T, HIGH SENSITIVITY    EKG: EKG Interpretation Date/Time:  Wednesday May 28 2024 21:34:41 EST Ventricular Rate:  73 PR Interval:  162 QRS Duration:  83 QT Interval:  377 QTC Calculation: 416 R Axis:   61  Text Interpretation: Sinus rhythm Probable left atrial enlargement Confirmed by Ruthe Cornet [phone]) on 05/28/2024 9:42:54 PM  Radiology: [facility] Chest 2 View Result Date: 05/28/2024 EXAM: 2 VIEW(S) XRAY OF THE CHEST 05/28/2024 09:52:00 PM COMPARISON: 08/17/2023 CLINICAL HISTORY: Chest pain FINDINGS: LUNGS AND PLEURA: No focal pulmonary opacity. No pleural effusion. No pneumothorax. HEART AND MEDIASTINUM: No acute abnormality of the cardiac and mediastinal  silhouettes. BONES AND SOFT TISSUES: Medial right breast surgical clips noted. No acute osseous abnormality. IMPRESSION: 1. No acute cardiopulmonary abnormality. Electronically signed by: Pinkie Pebbles MD 05/28/2024 09:58 PM EST RP Workstation: [id number]     Procedures   Medications Ordered in the ED  potassium chloride  SA (KLOR-CON  M) CR tablet 40 mEq (40 mEq Oral Given 05/28/24 2236)                                    Medical Decision Making Amount and/or Complexity of Data Reviewed Labs: ordered. Radiology: ordered.  Risk Prescription drug management.   Misty [name] is here with palpitations.  Unremarkable vitals.  No fever.  EKG that she has with her does show 1 PVC but otherwise no ischemic changes.  Repeat EKG here shows sinus rhythm.  No ischemic changes.  Ultimately patient appears well.  She does have history of breast cancer in remission on tamoxifen .  She denies any active symptoms now.  She does admit to some stressful event today.  Differential diagnosis this is likely some sort of stress process but will evaluate for ACS PE electrolyte abnormality anemia.  Will get CBC, CMP troponin, D-dimer chest x-ray.  Per my review and interpretation labs no significant leukocytosis.  Potassium is 3.2.  Will replete orally.  Troponin undetectable.  Is not having any chest pain.  I have no concern for ACS.  Chest x-ray showed no evidence of pneumonia pneumothorax.  Awaiting D-dimer.  Overall D-dimer is positive.  Will get a CT scan of her chest to further evaluate for PE and other pulmonary processes.  Will repeat troponin.  Patient handed off to oncoming ED staff the patient pending imaging.  Please  see his note for further results evaluation and disposition of the patient.  This chart was dictated using voice recognition software.  Despite best efforts to proofread,  errors can occur which can change the documentation meaning.      Final diagnoses:  Palpitations    ED  Discharge Orders     None          Ruthe Cornet, DO 05/28/24 2244

## 2024-05-28 NOTE — ED Provider Notes (Signed)
 Care of patient received from prior provider at 11:02 PM, please see their note for complete H/P and care plan.  Received handoff per ED course.  Clinical Course as of 05/28/24 2302  Wed May 28, 2024  2302 Palpitations  PE scan pending. NAD  [CC]    Clinical Course User Index [CC] Jerral Meth, MD    Reassessment: Patient's history of present illness and physical exam findings are more consistent with nonspecific palpitations.  History of similar and follows with cardiology in outpatient setting.  We referred back to cardiology for chronic care and management.  CTA PE study negative for any acute pathology and no evidence of ACS or other emergent pathology at this time.  Patient ambulatory tolerating p.o. intake in no acute distress.  Stable for outpatient care and management.     Jerral Meth, MD 05/29/24 [phone]

## 2024-05-28 NOTE — Telephone Encounter (Signed)
 left vm for pt about provider change for her 12/31 telephone appt. Encouraged to call back if need to reschedule

## 2024-05-28 NOTE — Discharge Instructions (Signed)
 Follow-up with your primary care doctor.  Take potassium supplements for the next few days to help prevent palpitations.  Try to decrease your caffeine intake.  Return if symptoms worsen.

## 2024-05-28 NOTE — ED Triage Notes (Signed)
 Pt sts she has recently started Losartan  and has been feeling nervous/jittery; she had an EKG at work tonight and reports it showed tachycardia and PVCs; c/o burning/indigestion-like feeling in chest

## 2024-05-29 ENCOUNTER — Other Ambulatory Visit: Payer: Self-pay

## 2024-05-29 ENCOUNTER — Other Ambulatory Visit (HOSPITAL_COMMUNITY): Payer: Self-pay

## 2024-05-29 LAB — TROPONIN T, HIGH SENSITIVITY: Troponin T High Sensitivity: <15 ng/L (ref 0–19)

## 2024-06-05 DIAGNOSIS — F5101 Primary insomnia: Secondary | ICD-10-CM | POA: Diagnosis not present

## 2024-06-05 DIAGNOSIS — F4310 Post-traumatic stress disorder, unspecified: Secondary | ICD-10-CM | POA: Diagnosis not present

## 2024-06-05 DIAGNOSIS — F320 Major depressive disorder, single episode, mild: Secondary | ICD-10-CM | POA: Diagnosis not present

## 2024-06-05 DIAGNOSIS — F900 Attention-deficit hyperactivity disorder, predominantly inattentive type: Secondary | ICD-10-CM | POA: Diagnosis not present

## 2024-06-05 DIAGNOSIS — F410 Panic disorder [episodic paroxysmal anxiety] without agoraphobia: Secondary | ICD-10-CM | POA: Diagnosis not present

## 2024-06-11 ENCOUNTER — Other Ambulatory Visit (HOSPITAL_COMMUNITY): Payer: Self-pay

## 2024-06-13 ENCOUNTER — Other Ambulatory Visit (HOSPITAL_COMMUNITY): Payer: Self-pay

## 2024-06-13 MED ORDER — MONTELUKAST SODIUM 10 MG PO TABS
10.0000 mg | ORAL_TABLET | Freq: Every evening | ORAL | 1 refills | Status: AC
  Filled 2024-06-13: qty 90, 90d supply, fill #0

## 2024-06-16 ENCOUNTER — Inpatient Hospital Stay: Attending: Hematology and Oncology

## 2024-06-16 DIAGNOSIS — Z79810 Long term (current) use of selective estrogen receptor modulators (SERMs): Secondary | ICD-10-CM | POA: Diagnosis not present

## 2024-06-16 DIAGNOSIS — D509 Iron deficiency anemia, unspecified: Secondary | ICD-10-CM | POA: Insufficient documentation

## 2024-06-16 DIAGNOSIS — D0511 Intraductal carcinoma in situ of right breast: Secondary | ICD-10-CM | POA: Diagnosis present

## 2024-06-16 DIAGNOSIS — Z923 Personal history of irradiation: Secondary | ICD-10-CM | POA: Insufficient documentation

## 2024-06-16 LAB — CBC WITH DIFFERENTIAL (CANCER CENTER ONLY)
Abs Immature Granulocytes: 0.01 K/uL (ref 0.00–0.07)
Basophils Absolute: 0 K/uL (ref 0.0–0.1)
Basophils Relative: 1 %
Eosinophils Absolute: 0.1 K/uL (ref 0.0–0.5)
Eosinophils Relative: 2 %
HCT: 40.5 % (ref 36.0–46.0)
Hemoglobin: 13.3 g/dL (ref 12.0–15.0)
Immature Granulocytes: 0 %
Lymphocytes Relative: 39 %
Lymphs Abs: 2.1 K/uL (ref 0.7–4.0)
MCH: 28.5 pg (ref 26.0–34.0)
MCHC: 32.8 g/dL (ref 30.0–36.0)
MCV: 86.7 fL (ref 80.0–100.0)
Monocytes Absolute: 0.5 K/uL (ref 0.1–1.0)
Monocytes Relative: 9 %
Neutro Abs: 2.7 K/uL (ref 1.7–7.7)
Neutrophils Relative %: 49 %
Platelet Count: 221 K/uL (ref 150–400)
RBC: 4.67 MIL/uL (ref 3.87–5.11)
RDW: 13.9 % (ref 11.5–15.5)
WBC Count: 5.5 K/uL (ref 4.0–10.5)
nRBC: 0 % (ref 0.0–0.2)

## 2024-06-16 LAB — IRON AND IRON BINDING CAPACITY (CC-WL,HP ONLY)
Iron: 78 ug/dL (ref 28–170)
Saturation Ratios: 19 % (ref 10.4–31.8)
TIBC: 405 ug/dL (ref 250–450)
UIBC: 327 ug/dL

## 2024-06-16 LAB — FERRITIN: Ferritin: 48 ng/mL (ref 11–307)

## 2024-06-17 ENCOUNTER — Other Ambulatory Visit (HOSPITAL_COMMUNITY): Payer: Self-pay

## 2024-06-17 MED ORDER — FLUCONAZOLE 150 MG PO TABS
150.0000 mg | ORAL_TABLET | ORAL | 0 refills | Status: DC
  Filled 2024-06-17: qty 2, 2d supply, fill #0

## 2024-06-18 ENCOUNTER — Inpatient Hospital Stay: Admitting: Hematology and Oncology

## 2024-06-24 ENCOUNTER — Inpatient Hospital Stay: Payer: Commercial Managed Care - PPO | Attending: Hematology and Oncology | Admitting: Hematology and Oncology

## 2024-06-24 VITALS — BP 121/58 | HR 72 | Temp 98.6°F | Resp 17 | Wt 195.9 lb

## 2024-06-24 DIAGNOSIS — D509 Iron deficiency anemia, unspecified: Secondary | ICD-10-CM | POA: Diagnosis not present

## 2024-06-24 DIAGNOSIS — Z923 Personal history of irradiation: Secondary | ICD-10-CM | POA: Diagnosis not present

## 2024-06-24 DIAGNOSIS — Z79810 Long term (current) use of selective estrogen receptor modulators (SERMs): Secondary | ICD-10-CM | POA: Insufficient documentation

## 2024-06-24 DIAGNOSIS — D0511 Intraductal carcinoma in situ of right breast: Secondary | ICD-10-CM | POA: Insufficient documentation

## 2024-06-24 DIAGNOSIS — I493 Ventricular premature depolarization: Secondary | ICD-10-CM | POA: Diagnosis not present

## 2024-06-24 NOTE — Progress Notes (Signed)
 "  Patient Care Team: Rolinda Millman, MD as PCP - General (Family Medicine) Vanderbilt Ned, MD as Consulting Physician (General Surgery) Dewey Rush, MD as Consulting Physician (Radiation Oncology) Odean Potts, MD as Consulting Physician (Hematology and Oncology)  DIAGNOSIS:  Encounter Diagnoses  Name Primary?   Ductal carcinoma in situ (DCIS) of right breast Yes   Iron  deficiency anemia, unspecified iron  deficiency anemia type     SUMMARY OF ONCOLOGIC HISTORY: Oncology History  Ductal carcinoma in situ (DCIS) of right breast  04/28/2021 Initial Diagnosis   Screening mammogram detected right breast mass at 4 o'clock position 10 cm from the nipple measuring 2.8 x 1.5 x 3.3 cm, axilla negative, biopsy revealed low to intermediate grade DCIS involving papillary lesion, ER/PR positive   05/26/2021 Surgery   (Dr. Vanderbilt): Right lumpectomy: Intermediate grade DCIS involving a papillary lesion 5.2 cm, no evidence of invasive cancer, resection margins are negative, ER 100%, PR 100%   06/17/2021 Genetic Testing   The [employer] CustomNext Panel found no pathogenic mutations.    The CustomNext-Cancer+RNAinsight panel offered by [employer] includes sequencing and rearrangement analysis for the following 47 genes:  APC, ATM, AXIN2, BARD1, BMPR1A, BRCA1, BRCA2, BRIP1, CDH1, CDK4, CDKN2A, CHEK2, CTNNA1, DICER1, EPCAM, GREM1, HOXB13, KIT, MEN1, MLH1, MSH2, MSH3, MSH6, MUTYH, NBN, NF1, NTHL1, PALB2, PDGFRA, PMS2, POLD1, POLE, PTEN, RAD50, RAD51C, RAD51D, SDHA, SDHB, SDHC, SDHD, SMAD4, SMARCA4, STK11, TP53, TSC1, TSC2, and VHL.  RNA data is routinely analyzed for use in variant interpretation for all genes.   07/05/2021 - 08/22/2021 Radiation Therapy   Site Technique Total Dose (Gy) Dose per Fx (Gy) Completed Fx Beam Energies  Breast, Right: Breast_R 3D 50.4/50.4 1.8 28/28 6XFFF  Breast, Right: Breast_R_Bst 3D 10/10 2 5/5 6X, 10X     09/17/2021 -  Anti-estrogen oral therapy   10 mg Tamoxifen  daily    11/29/2021 Cancer Staging   Staging form: Breast, AJCC 8th Edition - Clinical: Stage 0 (cTis (DCIS), cN0, cM0) - Signed by Crawford Morna Pickle, NP on 11/29/2021 Stage prefix: Initial diagnosis     CHIEF COMPLIANT: Follow-up of iron  deficiency and history of breast cancer, complaining of PVCs  HISTORY OF PRESENT ILLNESS:  History of Present Illness Misty [name] is a 49 year old female with ductal carcinoma in situ of the right breast, status post lumpectomy and adjuvant radiation, who presents for routine follow-up and evaluation of recent cough and iron  deficiency anemia.  She continues tamoxifen  10 mg daily without new breast symptoms. She has no breast pain, masses, or nipple discharge.  She has iron  deficiency anemia from uterine fibroids and takes daily iron  supplements. Her hemoglobin and ferritin have normalized, with recent ferritin 48. She denies current heavy menstrual bleeding or other gynecologic symptoms.  She recently developed a deep, persistent cough, sometimes triggered by drinking thin liquids, and palpitations attributed to premature ventricular contractions. In a recent emergency room visit, D-dimer was elevated but CT angiogram was negative for pulmonary embolism and troponin was normal. She is concerned about the cause of these symptoms. She denies joint pain, arthritis, or sinus symptoms.      ALLERGIES:  is allergic to dilaudid  [hydromorphone  hcl], ceftriaxone sodium, and latex.  MEDICATIONS:  Current Outpatient Medications  Medication Sig Dispense Refill   albuterol  (VENTOLIN  HFA) 108 (90 Base) MCG/ACT inhaler Inhale 1 puff into the lungs every 4 (four) hours as needed. 20.1 g 0   albuterol  (VENTOLIN  HFA) 108 (90 Base) MCG/ACT inhaler Inhale 1 puff into the lungs every 4 (four)  hours as needed. 20.1 g 0   ALPRAZolam  (XANAX ) 1 MG tablet Take 1 tablet (1 mg total) by mouth 3 (three) times daily. 270 tablet 0   ALPRAZolam  (XANAX ) 1 MG tablet Take 1 tablet  (1 mg total) by mouth 3 (three) times daily. 270 tablet 0   ALPRAZolam  (XANAX ) 1 MG tablet Take 1 tablet (1 mg total) by mouth 3 (three) times daily. 270 tablet 0   amLODipine  (NORVASC ) 10 MG tablet Take 1 tablet (10 mg total) by mouth daily. 90 tablet 1   amLODipine  (NORVASC ) 5 MG tablet Take 1 tablet (5 mg total) by mouth daily. 30 tablet 0   amoxicillin  (AMOXIL ) 875 MG tablet Take 1 tablet (875 mg total) by mouth 2 (two) times daily. 14 tablet 0   amphetamine -dextroamphetamine  (ADDERALL) 20 MG tablet Take 1 tablet (20 mg total) by mouth 3 (three) times daily. 270 tablet 0   amphetamine -dextroamphetamine  (ADDERALL) 20 MG tablet Take 1 tablet (20 mg total) by mouth 3 (three) times daily. 270 tablet 0   amphetamine -dextroamphetamine  (ADDERALL) 20 MG tablet Take 1 tablet (20 mg total) by mouth 3 (three) times daily. 270 tablet 0   Blood Pressure Monitor MISC use as directed 1 each 0   busPIRone  (BUSPAR ) 5 MG tablet Take 1 tablet (5 mg total) by mouth 2 (two) times daily. 60 tablet 0   Ferric Maltol  (ACCRUFER ) 30 MG CAPS Take 1 capsule (30 mg total) by mouth 2 (two) times daily on an empty stomach (Patient not taking: Reported on 06/25/2023) 60 capsule 2   fluconazole  (DIFLUCAN ) 150 MG tablet Take 1 tablet (150 mg total) by mouth now, may repeat 72 hours if needed 2 tablet 0   HYDROcodone  bit-homatropine (HYCODAN) 5-1.5 MG/5ML syrup Take 5 mLs by mouth every 8 (eight) hours. 75 mL 0   losartan  (COZAAR ) 50 MG tablet Take 1 tablet (50 mg total) by mouth daily. 90 tablet 0   montelukast  (SINGULAIR ) 10 MG tablet Take 1 tablet (10 mg total) by mouth every evening. 90 tablet 0   montelukast  (SINGULAIR ) 10 MG tablet Take 1 tablet (10 mg total) by mouth every evening. 90 tablet 1   nystatin  powder Apply 1 Application topically 3 (three) times daily.     omeprazole  (PRILOSEC) 40 MG capsule Take 1 capsule (40 mg total) by mouth every morning 30 minutes before a meal 90 capsule 0   omeprazole  (PRILOSEC) 40 MG  capsule Take 1 capsule by mouth once daily 30 minutes before morning meal 90 capsule 0   ondansetron  (ZOFRAN -ODT) 4 MG disintegrating tablet Dissolve 1 tablet (4 mg total) in the mouth every 6 (six) hours as needed for nausea 30 tablet 1   ondansetron  (ZOFRAN -ODT) 4 MG disintegrating tablet Dissolve 1 tablet in mouth every 6 (six) hours as needed for nausea 30 tablet 0   ondansetron  (ZOFRAN -ODT) 8 MG disintegrating tablet Dissolve 1 tablet by mouth every 6 hours as needed for nausea 30 tablet 1   oxyCODONE -acetaminophen  (PERCOCET/ROXICET) 5-325 MG tablet Take 1 tablet by mouth every 6 hours as needed for severe pain (Patient not taking: Reported on 02/23/2023) 20 tablet 0   potassium chloride  SA (KLOR-CON  M) 20 MEQ tablet Take 1 tablet (20 mEq total) by mouth 2 (two) times daily for 3 days. 10 tablet 0   predniSONE  (DELTASONE ) 20 MG tablet Take 2 tablets (40 mg total) by mouth daily for 4 days. 8 tablet 0   SUMAtriptan  (IMITREX ) 50 MG tablet Take 1 tablet by mouth at onset  of headache followed by 1 tablet 2 hours later if symptoms persist 9 tablet 0   tamoxifen  (NOLVADEX ) 10 MG tablet Take 1 tablet (10 mg total) by mouth daily. 90 tablet 3   tirzepatide  (ZEPBOUND ) 2.5 MG/0.5ML Pen Inject 2.5 mg into the skin once a week. 2 mL 0   tirzepatide  (ZEPBOUND ) 5 MG/0.5ML Pen Inject 5 mg into the skin once a week. 2 mL 0   traMADol  (ULTRAM ) 50 MG tablet Take 1 tablet (50 mg total) by mouth every 6 (six) hours as needed for moderate pain. 12 tablet 0   No current facility-administered medications for this visit.    PHYSICAL EXAMINATION: ECOG PERFORMANCE STATUS: 1 - Symptomatic but completely ambulatory  There were no vitals filed for this visit. There were no vitals filed for this visit.  Physical Exam   (exam performed in the presence of a chaperone)  LABORATORY DATA:  I have reviewed the data as listed    Latest Ref Rng & Units 05/28/2024    9:36 PM 08/11/2022    6:33 AM 05/16/2022    6:30 PM   CMP  Glucose 70 - 99 mg/dL 894  893  90   BUN 6 - 20 mg/dL 11  10  8    Creatinine 0.44 - 1.00 mg/dL 9.19  9.09  9.17   Sodium 135 - 145 mmol/L 141  139  138   Potassium 3.5 - 5.1 mmol/L 3.2  3.3  3.6   Chloride 98 - 111 mmol/L 103  105  110   CO2 22 - 32 mmol/L 28   22   Calcium 8.9 - 10.3 mg/dL 9.4   89.6   Total Protein 6.5 - 8.1 g/dL 6.9     Total Bilirubin 0.0 - 1.2 mg/dL 0.4     Alkaline Phos 38 - 126 U/L 53     AST 15 - 41 U/L 19     ALT 0 - 44 U/L 23       Lab Results  Component Value Date   WBC 5.5 06/16/2024   HGB 13.3 06/16/2024   HCT 40.5 06/16/2024   MCV 86.7 06/16/2024   PLT 221 06/16/2024   NEUTROABS 2.7 06/16/2024    ASSESSMENT & PLAN:  Ductal carcinoma in situ (DCIS) of right breast 05/26/2021: (Dr. 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 adjuvant radiation therapy 07/27/2021-08/22/2021 2. Followed by antiestrogen therapy with tamoxifen  5 years.  I encouraged her to start the tamoxifen  at 10 mg daily 09/17/2021   Breast cancer surveillance:  04/24/2024 mammograms: Benign breast density category C Breast exam 06/25/2023 benign   Primary hyperparathyroidism: Status post left parathyroidectomy: Adenoma    Iron  deficiency anemia Lab review: 06/25/2023: Hemoglobin 12, MCV 66.6, ferritin 4 08/06/2023: Hemoglobin 13.3, MCV 76.5, ferritin 118, iron  saturation 10%, TIBC 416 02/05/2024: Hemoglobin 12.7, MCV 81, iron  saturation 8%, ferritin 18, TIBC 419 06/16/2024: Hemoglobin 13.3, MCV 86.7, iron  saturation 19%, ferritin 48   Probably cause of IDA: Fibroids Since the bleeding has subsided, she is not planning on doing any further gynecological procedures.   IV iron : January 2025, September 2025 Tremors and palpitations:    Palpitation: Patient was diagnosed with PVCs at the emergency room.  Cardiology was recommended  but they did not send the consult.  Son requesting the consult today.    Labs in 6 months and telephone follow-up  after that to discuss results  No orders of the defined types were placed in this encounter.  The patient has a good understanding of the overall plan. she agrees with it. she will call with any problems that may develop before the next visit here.  I personally spent a total of 30 minutes in the care of the patient today including preparing to see the patient, getting/reviewing separately obtained history, performing a medically appropriate exam/evaluation, counseling and educating, placing orders, referring and communicating with other health care professionals, documenting clinical information in the EHR, independently interpreting results, communicating results, and coordinating care.   Viinay K Lamichael Youkhana, MD 06/24/2024    "

## 2024-06-24 NOTE — Assessment & Plan Note (Signed)
 05/26/2021: (Dr. 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 adjuvant radiation therapy 07/27/2021-08/22/2021 2. Followed by antiestrogen therapy with tamoxifen  5 years.  I encouraged her to start the tamoxifen  at 10 mg daily 09/17/2021   Breast cancer surveillance:  04/24/2024 mammograms: Benign breast density category C Breast exam 06/25/2023 benign   Primary hyperparathyroidism: Status post left parathyroidectomy: Adenoma

## 2024-06-24 NOTE — Assessment & Plan Note (Signed)
 Lab review: 06/25/2023: Hemoglobin 12, MCV 66.6, ferritin 4 08/06/2023: Hemoglobin 13.3, MCV 76.5, ferritin 118, iron  saturation 10%, TIBC 416 02/05/2024: Hemoglobin 12.7, MCV 81, iron  saturation 8%, ferritin 18, TIBC 419 06/16/2024: Hemoglobin 13.3, MCV 86.7, iron  saturation 19%, ferritin 48   Probably cause of IDA: Fibroids Sees Gyn to discuss hysterectomy.   IV iron : January 2025, September 2025 Tremors and palpitations:   Labs in 4 months and follow-up after that to discuss results

## 2024-07-01 NOTE — Progress Notes (Unsigned)
 "  Chief Complaint: GERD  HPI:    Misty [name] is a 49 year old female, known to Dr. Wilhelmenia, with a past medical history as listed below including GERD and GAD, who was referred to me by [facility] for a complaint of GERD.    02/21/2022 EGD with 3 cm hiatal hernia, erythematous mucosa in the stomach and otherwise normal.  Pathology showed reactive gastropathy and minimal chronic gastritis.    02/21/2022 colonoscopy with perianal skin tags, hemorrhoids, diverticulosis in the rectosigmoid colon and sigmoid colon nonbleeding nonthrombosed internal and external hemorrhoids.  Repeat recommended in 10 years.    05/28/2024 CT angio of the chest for pulmonary embolism showed no pulmonary embolism, no acute cardiopulmonary disease, nonobstructing 3 mm left renal upper pole calculus.    05/28/2024 patient seen in the ER for palpitations.  At the time these were felt nonspecific.  She was referred back to cardiology for chronic care management.  CTA PE negative.    06/16/2024 CBC and iron  studies with ferritin normal.    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  Past Medical History:  Diagnosis Date   ADHD (attention deficit hyperactivity disorder)    Follows with Dr. Vincente, psychiatrist.   Anemia    Arthritis    Asthma    as a child   Environmental and seasonal allergies    GAD (generalized anxiety disorder)    Follows with Dr. Vincente, psychiatrist.   GERD (gastroesophageal reflux disease)    Headache    migraines, follows w/ PCP @ [facility]   History of COVID-19 06/12/2020   result in epic;  per pt mild symptoms that resolved   History of hiatal hernia    per 02/21/22 EGD   History of kidney stones    History of radiation therapy    07-27-2021 to 08-22-2021 for righ breast cancer   Hyperparathyroidism, primary 2019   currently followed by pcp  (previously seen endocrinologist-- dr beryl ronco  note in epic 08-29-2017, doctor moved away)   Personal history of radiation therapy    Plantar fasciitis 04/2020   Right Breast cancer (HCC) 04/28/2021   Follows w/ Dr. Odean.   Sepsis (HCC)    admit 07-26-2021   Skin abnormalities 08/23/2021   right breast areas from radiation top of breast, around nipple and under right breast healing well   Tenosynovitis, de Quervain    left wrist   Wears glasses    Wears partial dentures upper flipper     Past Surgical History:  Procedure Laterality Date   BREAST LUMPECTOMY Right 05/2021   BREAST LUMPECTOMY WITH RADIOACTIVE SEED LOCALIZATION Right 05/26/2021   Procedure: RIGHT BREAST LUMPECTOMY WITH RADIOACTIVE SEED LOCALIZATION;  Surgeon: Vanderbilt Ned, MD;  Location: [facility];  Service: General;  Laterality: Right;   COLONOSCOPY  05/23/2019   COLONOSCOPY WITH ESOPHAGOGASTRODUODENOSCOPY (EGD)  02/21/2022   hiatal hernia   CYSTO W/ RIGHT URETEROSCOPY  06/19/2004   CYSTO/ RIGHT URETERAL STENT PLACEMENT  07/29/2009   LARGE RIGHT RENAL CALCULUS   CYSTOLITHOLAPAXY OF LARGE BLADDER CALCULI/   RIGHT URETERAL STENT EXCHANGE  04/15/2010   CYSTOSCOPY WITH RETROGRADE PYELOGRAM, URETEROSCOPY AND STENT PLACEMENT Right 08/26/2021   Procedure: CYSTOSCOPY, URETEROSCOPY AND STENT  EXCHANGE, EXTRACTION OF RIGHT URETERAL STONE, FLUOROSCOPIC INTERPRETATION;  Surgeon: Matilda Senior, MD;  Location: [facility];  Service: Urology;  Laterality: Right;   CYSTOSCOPY WITH STENT PLACEMENT  07/04/2012  Procedure: CYSTOSCOPY WITH STENT PLACEMENT;  Surgeon: Garnette Shack, MD;  Location: [facility];  Service: Urology;  Laterality: Right;  rt dbl j stent placement    CYSTOSCOPY WITH STENT PLACEMENT Right 07/26/2021   Procedure: CYSTOSCOPY WITH STENT PLACEMENT;  Surgeon: Cam Morene ORN, MD;  Location: [facility];  Service: Urology;  Laterality: Right;   CYSTOSCOPY/RETROGRADE/URETEROSCOPY/STONE EXTRACTION WITH BASKET   07/25/2012   Procedure: CYSTOSCOPY/RETROGRADE/URETEROSCOPY/STONE EXTRACTION WITH BASKET;  Surgeon: Garnette Shack, MD;  Location: [facility];  Service: Urology;  Laterality: Right;   CYSTOSCOPY/URETEROSCOPY/HOLMIUM LASER/STENT PLACEMENT Left 08/11/2022   Procedure: CYSTOSCOPY/LEFT URETEROSCOPY/HOLMIUM LASER/LEFT RETROGRADE PYELOGRAM/LEFT STENT PLACEMENT;  Surgeon: Cam Morene ORN, MD;  Location: [facility];  Service: Urology;  Laterality: Left;   EXTRACORPOREAL SHOCK WAVE LITHOTRIPSY  08/02/2009   RIGHT   HOLMIUM LASER APPLICATION  07/25/2012   Procedure: HOLMIUM LASER APPLICATION;  Surgeon: Garnette Shack, MD;  Location: [facility];  Service: Urology;  Laterality: Right;   HOLMIUM LASER APPLICATION Right 08/26/2021   Procedure: HOLMIUM LASER APPLICATION;  Surgeon: Shack Garnette, MD;  Location: [facility];  Service: Urology;  Laterality: Right;   LAPAROSCOPIC TUBAL LIGATION Bilateral 01/17/2013   Procedure: LAPAROSCOPIC TUBAL LIGATION;  Surgeon: Debby JULIANNA Lares, MD;  Location: [facility];  Service: Gynecology;  Laterality: Bilateral;  1hr OR time    MINOR RELEASE DORSAL COMPARTMENT (DEQUERVAINS) Left 04/26/2021   Procedure: MINOR RELEASE DORSAL COMPARTMENT (DEQUERVAINS) WITH GANGLION EXCISION;  Surgeon: Beverley Evalene BIRCH, MD;  Location: [facility];  Service: Orthopedics;  Laterality: Left;   PARATHYROIDECTOMY Left 03/09/2023   Procedure: LEFT PARATHYROIDECTOMY;  Surgeon: Eletha Boas, MD;  Location: [facility];  Service: General;  Laterality: Left;    Current Outpatient Medications  Medication Sig Dispense Refill   albuterol  (VENTOLIN  HFA) 108 (90 Base) MCG/ACT inhaler Inhale 1 puff into the lungs every 4 (four) hours as needed. 20.1 g 0   albuterol  (VENTOLIN  HFA) 108 (90 Base) MCG/ACT inhaler Inhale 1 puff into the lungs every 4 (four) hours as needed. 20.1 g 0   ALPRAZolam  (XANAX ) 1 MG tablet Take 1 tablet (1  mg total) by mouth 3 (three) times daily. 270 tablet 0   ALPRAZolam  (XANAX ) 1 MG tablet Take 1 tablet (1 mg total) by mouth 3 (three) times daily. 270 tablet 0   amLODipine  (NORVASC ) 5 MG tablet Take 1 tablet (5 mg total) by mouth daily. 30 tablet 0   amphetamine -dextroamphetamine  (ADDERALL) 20 MG tablet Take 1 tablet (20 mg total) by mouth 3 (three) times daily. 270 tablet 0   amphetamine -dextroamphetamine  (ADDERALL) 20 MG tablet Take 1 tablet (20 mg total) by mouth 3 (three) times daily. 270 tablet 0   amphetamine -dextroamphetamine  (ADDERALL) 20 MG tablet Take 1 tablet (20 mg total) by mouth 3 (three) times daily. 270 tablet 0   Blood Pressure Monitor MISC use as directed 1 each 0   busPIRone  (BUSPAR ) 5 MG tablet Take 1 tablet (5 mg total) by mouth 2 (two) times daily. 60 tablet 0   losartan  (COZAAR ) 50 MG tablet Take 1 tablet (50 mg total) by mouth daily. 90 tablet 0   montelukast  (SINGULAIR ) 10 MG tablet Take 1 tablet (10 mg total) by mouth every evening. 90 tablet 0   montelukast  (SINGULAIR ) 10 MG tablet Take 1 tablet (10 mg total) by mouth every evening. 90 tablet 1   omeprazole  (PRILOSEC) 40 MG capsule Take 1 capsule (40 mg total) by mouth every morning 30 minutes before  a meal 90 capsule 0   omeprazole  (PRILOSEC) 40 MG capsule Take 1 capsule by mouth once daily 30 minutes before morning meal 90 capsule 0   ondansetron  (ZOFRAN -ODT) 4 MG disintegrating tablet Dissolve 1 tablet (4 mg total) in the mouth every 6 (six) hours as needed for nausea 30 tablet 1   ondansetron  (ZOFRAN -ODT) 4 MG disintegrating tablet Dissolve 1 tablet in mouth every 6 (six) hours as needed for nausea 30 tablet 0   ondansetron  (ZOFRAN -ODT) 8 MG disintegrating tablet Dissolve 1 tablet by mouth every 6 hours as needed for nausea 30 tablet 1   oxyCODONE -acetaminophen  (PERCOCET/ROXICET) 5-325 MG tablet Take 1 tablet by mouth every 6 hours as needed for severe pain (Patient not taking: Reported on 02/23/2023) 20 tablet 0    potassium chloride  SA (KLOR-CON  M) 20 MEQ tablet Take 1 tablet (20 mEq total) by mouth 2 (two) times daily for 3 days. 10 tablet 0   predniSONE  (DELTASONE ) 20 MG tablet Take 2 tablets (40 mg total) by mouth daily for 4 days. 8 tablet 0   tamoxifen  (NOLVADEX ) 10 MG tablet Take 1 tablet (10 mg total) by mouth daily. 90 tablet 3   tirzepatide  (ZEPBOUND ) 5 MG/0.5ML Pen Inject 5 mg into the skin once a week. 2 mL 0   No current facility-administered medications for this visit.    Allergies as of 07/02/2024 - Review Complete 06/24/2024  Allergen Reaction Noted   Dilaudid  [hydromorphone  hcl] Shortness Of Breath and Nausea And Vomiting 07/04/2012   Ceftriaxone sodium Other (See Comments) 07/22/2007   Latex Rash 10/31/2011    Family History  Problem Relation Age of Onset   Breast cancer Maternal Aunt 68       DCIS   Esophageal cancer Maternal Uncle    Stomach cancer Neg Hx    Colon cancer Neg Hx    Pancreatic cancer Neg Hx     Social History   Socioeconomic History   Marital status: Divorced    Spouse name: Not on file   Number of children: Not on file   Years of education: Not on file   Highest education level: Not on file  Occupational History   Not on file  Tobacco Use   Smoking status: Never    Passive exposure: Never   Smokeless tobacco: Never  Vaping Use   Vaping status: Never Used  Substance and Sexual Activity   Alcohol use: Yes    Comment: occasional   Drug use: Never   Sexual activity: Not on file    Comment: tubal ligation  Other Topics Concern   Not on file  Social History Narrative   Not on file   Social Drivers of Health   Tobacco Use: Low Risk (05/28/2024)   Patient History    Smoking Tobacco Use: Never    Smokeless Tobacco Use: Never    Passive Exposure: Never  Financial Resource Strain: Not on file  Food Insecurity: Not on file  Transportation Needs: Not on file  Physical Activity: Not on file  Stress: Not on file  Social Connections: Not on  file  Intimate Partner Violence: Not on file  Depression (PHQ2-9): Low Risk (07/24/2023)   Depression (PHQ2-9)    PHQ-2 Score: 0  Alcohol Screen: Not on file  Housing: Not on file  Utilities: Not on file  Health Literacy: Not on file    Review of Systems:    Constitutional: No weight loss, fever, chills, weakness or fatigue HEENT: Eyes: No change in vision  Ears, Nose, Throat:  No change in hearing or congestion Skin: No rash or itching Cardiovascular: No chest pain, chest pressure or palpitations   Respiratory: No SOB or cough Gastrointestinal: See HPI and otherwise negative Genitourinary: No dysuria or change in urinary frequency Neurological: No headache, dizziness or syncope Musculoskeletal: No new muscle or joint pain Hematologic: No bleeding or bruising Psychiatric: No history of depression or anxiety    Physical Exam:  Vital signs: There were no vitals taken for this visit.  Constitutional:   Pleasant Caucasian female appears to be in NAD, Well developed, Well nourished, alert and cooperative Head:  Normocephalic and atraumatic. Eyes:   PEERL, EOMI. No icterus. Conjunctiva pink. Ears:  Normal auditory acuity. Neck:  Supple Throat: Oral cavity and pharynx without inflammation, swelling or lesion.  Respiratory: Respirations even and unlabored. Lungs clear to auscultation bilaterally.   No wheezes, crackles, or rhonchi.  Cardiovascular: Normal S1, S2. No MRG. Regular rate and rhythm. No peripheral edema, cyanosis or pallor.  Gastrointestinal:  Soft, nondistended, nontender. No rebound or guarding. Normal bowel sounds. No appreciable masses or hepatomegaly. Rectal:  Not performed.  Msk:  Symmetrical without gross deformities. Without edema, no deformity or joint abnormality.  Neurologic:  Alert and  oriented x4;  grossly normal neurologically.  Skin:   Dry and intact without significant lesions or rashes. Psychiatric: Oriented to person, place and time.  Demonstrates good judgement and reason without abnormal affect or behaviors.  RELEVANT LABS AND IMAGING: CBC    Component Value Date/Time   WBC 5.5 06/16/2024 1404   WBC 7.2 05/28/2024 2136   RBC 4.67 06/16/2024 1404   HGB 13.3 06/16/2024 1404   HCT 40.5 06/16/2024 1404   PLT 221 06/16/2024 1404   MCV 86.7 06/16/2024 1404   MCH 28.5 06/16/2024 1404   MCHC 32.8 06/16/2024 1404   RDW 13.9 06/16/2024 1404   LYMPHSABS 2.1 06/16/2024 1404   MONOABS 0.5 06/16/2024 1404   EOSABS 0.1 06/16/2024 1404   BASOSABS 0.0 06/16/2024 1404    CMP     Component Value Date/Time   NA 141 05/28/2024 2136   K 3.2 (L) 05/28/2024 2136   CL 103 05/28/2024 2136   CO2 28 05/28/2024 2136   GLUCOSE 105 (H) 05/28/2024 2136   BUN 11 05/28/2024 2136   CREATININE 0.80 05/28/2024 2136   CALCIUM 9.4 05/28/2024 2136   PROT 6.9 05/28/2024 2136   ALBUMIN 4.2 05/28/2024 2136   AST 19 05/28/2024 2136   ALT 23 05/28/2024 2136   ALKPHOS 53 05/28/2024 2136   BILITOT 0.4 05/28/2024 2136   GFRNONAA >60 05/28/2024 2136   GFRAA >60 10/18/2017 1007    Assessment: 1. ***  Plan: 1. ***     Delon Failing, PA-C [facility] 07/01/2024, 10:14 AM  Cc: [facility]  "

## 2024-07-02 ENCOUNTER — Ambulatory Visit: Admitting: Physician Assistant

## 2024-07-02 ENCOUNTER — Encounter: Payer: Self-pay | Admitting: Physician Assistant

## 2024-07-02 ENCOUNTER — Other Ambulatory Visit (HOSPITAL_COMMUNITY): Payer: Self-pay

## 2024-07-02 VITALS — BP 120/82 | HR 60 | Ht 69.0 in | Wt 199.0 lb

## 2024-07-02 DIAGNOSIS — K2950 Unspecified chronic gastritis without bleeding: Secondary | ICD-10-CM

## 2024-07-02 DIAGNOSIS — K449 Diaphragmatic hernia without obstruction or gangrene: Secondary | ICD-10-CM | POA: Diagnosis not present

## 2024-07-02 DIAGNOSIS — K2970 Gastritis, unspecified, without bleeding: Secondary | ICD-10-CM | POA: Diagnosis not present

## 2024-07-02 DIAGNOSIS — K219 Gastro-esophageal reflux disease without esophagitis: Secondary | ICD-10-CM | POA: Diagnosis not present

## 2024-07-02 MED ORDER — OMEPRAZOLE 40 MG PO CPDR
40.0000 mg | DELAYED_RELEASE_CAPSULE | Freq: Two times a day (BID) | ORAL | 5 refills | Status: AC
  Filled 2024-07-02: qty 60, 30d supply, fill #0

## 2024-07-02 MED ORDER — FAMOTIDINE 20 MG PO TABS
20.0000 mg | ORAL_TABLET | ORAL | 3 refills | Status: AC | PRN
  Filled 2024-07-02: qty 30, 30d supply, fill #0

## 2024-07-02 NOTE — Patient Instructions (Addendum)
 We have sent the following medications to your pharmacy for you to pick up at your convenience: Omeprazole  40 mg twice daily 30-60 minutes before breakfast and dinner. Famotidine  20 mg as needed.   _______________________________________________________  If your blood pressure at your visit was 140/90 or greater, please contact your primary care physician to follow up on this.  _______________________________________________________  If you are age 51 or older, your body mass index should be between 23-30. Your Body mass index is 29.39 kg/m. If this is out of the aforementioned range listed, please consider follow up with your Primary Care Provider.  If you are age 39 or younger, your body mass index should be between 19-25. Your Body mass index is 29.39 kg/m. If this is out of the aformentioned range listed, please consider follow up with your Primary Care Provider.   ________________________________________________________  The [facility] providers would like to encourage you to use MYCHART to communicate with providers for non-urgent requests or questions.  Due to long hold times on the telephone, sending your provider a message by [facility] may be a faster and more efficient way to get a response.  Please allow 48 business hours for a response.  Please remember that this is for non-urgent requests.  _______________________________________________________  [facility] is using a team-based approach to care.  Your team is made up of your doctor and two to three APPS. Our APPS (Nurse Practitioners and Physician Assistants) work with your physician to ensure care continuity for you. They are fully qualified to address your health concerns and develop a treatment plan. They communicate directly with your gastroenterologist to care for you. Seeing the Advanced Practice Practitioners on your physician's team can help you by facilitating care more promptly, often allowing for earlier  appointments, access to diagnostic testing, procedures, and other specialty referrals.

## 2024-07-09 ENCOUNTER — Other Ambulatory Visit (HOSPITAL_COMMUNITY): Payer: Self-pay

## 2024-07-10 NOTE — Progress Notes (Signed)
"   Attending Physician's Attestation   I have reviewed the chart.   I agree with the Advanced Practitioner's note, impression, and recommendations with any updates as below. If persisting or progressive symptoms on follow-up, repeat upper endoscopy is recommended.   Aloha Finner, MD [facility] Advanced Endoscopy Office # [phone]  "

## 2024-07-24 ENCOUNTER — Other Ambulatory Visit (HOSPITAL_COMMUNITY): Payer: Self-pay

## 2024-07-24 ENCOUNTER — Other Ambulatory Visit: Payer: Self-pay

## 2024-07-24 MED ORDER — AMPHETAMINE-DEXTROAMPHETAMINE 20 MG PO TABS
20.0000 mg | ORAL_TABLET | Freq: Three times a day (TID) | ORAL | 0 refills | Status: AC
  Filled 2024-07-24: qty 270, 90d supply, fill #0

## 2024-07-24 MED ORDER — ALPRAZOLAM 1 MG PO TABS
1.0000 mg | ORAL_TABLET | Freq: Three times a day (TID) | ORAL | 0 refills | Status: AC
  Filled 2024-07-24: qty 270, 90d supply, fill #0

## 2024-12-22 ENCOUNTER — Inpatient Hospital Stay

## 2024-12-24 ENCOUNTER — Inpatient Hospital Stay: Admitting: Hematology and Oncology
